# Patient Record
Sex: Female | Born: 1935 | Race: Black or African American | Hispanic: No | Smoking: Former smoker
Health system: Southern US, Community
[De-identification: ages and names within clinical notes are randomized; demographics above are authoritative.]

## PROBLEM LIST (undated history)

## (undated) DIAGNOSIS — E1101 Type 2 diabetes mellitus with hyperosmolarity with coma: Secondary | ICD-10-CM

## (undated) DIAGNOSIS — K219 Gastro-esophageal reflux disease without esophagitis: Secondary | ICD-10-CM

## (undated) DIAGNOSIS — E039 Hypothyroidism, unspecified: Secondary | ICD-10-CM

## (undated) DIAGNOSIS — I82409 Acute embolism and thrombosis of unspecified deep veins of unspecified lower extremity: Secondary | ICD-10-CM

## (undated) DIAGNOSIS — N181 Chronic kidney disease, stage 1: Secondary | ICD-10-CM

## (undated) DIAGNOSIS — E7800 Pure hypercholesterolemia, unspecified: Secondary | ICD-10-CM

## (undated) DIAGNOSIS — I959 Hypotension, unspecified: Secondary | ICD-10-CM

## (undated) DIAGNOSIS — I809 Phlebitis and thrombophlebitis of unspecified site: Secondary | ICD-10-CM

## (undated) DIAGNOSIS — D649 Anemia, unspecified: Secondary | ICD-10-CM

## (undated) DIAGNOSIS — F419 Anxiety disorder, unspecified: Secondary | ICD-10-CM

## (undated) DIAGNOSIS — K922 Gastrointestinal hemorrhage, unspecified: Secondary | ICD-10-CM

## (undated) HISTORY — PX: CHOLECYSTECTOMY: SHX55

## (undated) HISTORY — PX: ESOPHAGEAL DILATION: SHX303

## (undated) HISTORY — DX: Hypothyroidism, unspecified: E03.9

## (undated) HISTORY — PX: TONSILLECTOMY: SUR1361

## (undated) HISTORY — PX: ABDOMINAL HYSTERECTOMY: SHX81

## (undated) HISTORY — DX: Anxiety disorder, unspecified: F41.9

---

## 2000-07-22 ENCOUNTER — Emergency Department (HOSPITAL_COMMUNITY): Admission: EM | Admit: 2000-07-22 | Discharge: 2000-07-22 | Payer: Self-pay | Admitting: Emergency Medicine

## 2000-07-22 ENCOUNTER — Encounter: Payer: Self-pay | Admitting: Emergency Medicine

## 2000-11-06 ENCOUNTER — Emergency Department (HOSPITAL_COMMUNITY): Admission: EM | Admit: 2000-11-06 | Discharge: 2000-11-06 | Payer: Self-pay | Admitting: Internal Medicine

## 2001-01-28 ENCOUNTER — Emergency Department (HOSPITAL_COMMUNITY): Admission: EM | Admit: 2001-01-28 | Discharge: 2001-01-28 | Payer: Self-pay | Admitting: Internal Medicine

## 2001-11-06 ENCOUNTER — Ambulatory Visit (HOSPITAL_COMMUNITY): Admission: RE | Admit: 2001-11-06 | Discharge: 2001-11-06 | Payer: Self-pay | Admitting: Pulmonary Disease

## 2003-11-13 ENCOUNTER — Emergency Department (HOSPITAL_COMMUNITY): Admission: EM | Admit: 2003-11-13 | Discharge: 2003-11-14 | Payer: Self-pay | Admitting: *Deleted

## 2003-11-14 ENCOUNTER — Ambulatory Visit (HOSPITAL_COMMUNITY): Admission: RE | Admit: 2003-11-14 | Discharge: 2003-11-14 | Payer: Self-pay | Admitting: *Deleted

## 2003-11-22 ENCOUNTER — Observation Stay (HOSPITAL_COMMUNITY): Admission: RE | Admit: 2003-11-22 | Discharge: 2003-11-23 | Payer: Self-pay | Admitting: General Surgery

## 2003-12-23 ENCOUNTER — Ambulatory Visit (HOSPITAL_COMMUNITY): Admission: RE | Admit: 2003-12-23 | Discharge: 2003-12-23 | Payer: Self-pay | Admitting: Pulmonary Disease

## 2009-12-22 ENCOUNTER — Inpatient Hospital Stay (HOSPITAL_COMMUNITY)
Admission: RE | Admit: 2009-12-22 | Discharge: 2009-12-24 | Payer: Self-pay | Source: Home / Self Care | Admitting: Pulmonary Disease

## 2010-03-01 ENCOUNTER — Emergency Department (HOSPITAL_COMMUNITY)
Admission: EM | Admit: 2010-03-01 | Discharge: 2010-03-01 | Payer: Self-pay | Source: Home / Self Care | Admitting: Emergency Medicine

## 2010-03-09 LAB — URINE MICROSCOPIC-ADD ON

## 2010-03-09 LAB — CBC
HCT: 34.2 % — ABNORMAL LOW (ref 36.0–46.0)
Hemoglobin: 11.5 g/dL — ABNORMAL LOW (ref 12.0–15.0)
MCH: 29.9 pg (ref 26.0–34.0)
MCHC: 33.6 g/dL (ref 30.0–36.0)
MCV: 89.1 fL (ref 78.0–100.0)
Platelets: 230 10*3/uL (ref 150–400)
RBC: 3.84 MIL/uL — ABNORMAL LOW (ref 3.87–5.11)
RDW: 13.3 % (ref 11.5–15.5)
WBC: 5.8 10*3/uL (ref 4.0–10.5)

## 2010-03-09 LAB — URINALYSIS, ROUTINE W REFLEX MICROSCOPIC
Bilirubin Urine: NEGATIVE
Ketones, ur: NEGATIVE mg/dL
Leukocytes, UA: NEGATIVE
Nitrite: NEGATIVE
Protein, ur: NEGATIVE mg/dL
Specific Gravity, Urine: 1.01 (ref 1.005–1.030)
Urine Glucose, Fasting: NEGATIVE mg/dL
Urobilinogen, UA: 0.2 mg/dL (ref 0.0–1.0)
pH: 5.5 (ref 5.0–8.0)

## 2010-03-09 LAB — DIFFERENTIAL
Basophils Absolute: 0 10*3/uL (ref 0.0–0.1)
Basophils Relative: 0 % (ref 0–1)
Eosinophils Absolute: 0 10*3/uL (ref 0.0–0.7)
Eosinophils Relative: 1 % (ref 0–5)
Lymphocytes Relative: 23 % (ref 12–46)
Lymphs Abs: 1.3 10*3/uL (ref 0.7–4.0)
Monocytes Absolute: 0.6 10*3/uL (ref 0.1–1.0)
Monocytes Relative: 10 % (ref 3–12)
Neutro Abs: 3.8 10*3/uL (ref 1.7–7.7)
Neutrophils Relative %: 66 % (ref 43–77)

## 2010-03-09 LAB — TROPONIN I: Troponin I: 0.01 ng/mL (ref 0.00–0.06)

## 2010-03-09 LAB — BASIC METABOLIC PANEL
BUN: 19 mg/dL (ref 6–23)
CO2: 24 mEq/L (ref 19–32)
Calcium: 9.2 mg/dL (ref 8.4–10.5)
Chloride: 105 mEq/L (ref 96–112)
Creatinine, Ser: 1.15 mg/dL (ref 0.4–1.2)
GFR calc Af Amer: 54 mL/min — ABNORMAL LOW (ref >60)
GFR calc non Af Amer: 44 mL/min — ABNORMAL LOW (ref >60)
Glucose, Bld: 95 mg/dL (ref 70–99)
Potassium: 4.1 mEq/L (ref 3.5–5.1)
Sodium: 137 mEq/L (ref 135–145)

## 2010-05-05 LAB — BASIC METABOLIC PANEL
BUN: 24 mg/dL — ABNORMAL HIGH (ref 6–23)
CO2: 25 mEq/L (ref 19–32)
Calcium: 9.4 mg/dL (ref 8.4–10.5)
Chloride: 107 mEq/L (ref 96–112)
Creatinine, Ser: 1.02 mg/dL (ref 0.4–1.2)
GFR calc Af Amer: >60 mL/min (ref >60)
GFR calc non Af Amer: 51 mL/min — ABNORMAL LOW (ref >60)
Glucose, Bld: 79 mg/dL (ref 70–99)
Potassium: 4.4 mEq/L (ref 3.5–5.1)
Sodium: 141 mEq/L (ref 135–145)

## 2010-05-05 LAB — CBC
HCT: 31.5 % — ABNORMAL LOW (ref 36.0–46.0)
Hemoglobin: 10.5 g/dL — ABNORMAL LOW (ref 12.0–15.0)
MCH: 30.4 pg (ref 26.0–34.0)
MCHC: 33.3 g/dL (ref 30.0–36.0)
MCV: 91.3 fL (ref 78.0–100.0)
Platelets: 287 10*3/uL (ref 150–400)
RBC: 3.45 MIL/uL — ABNORMAL LOW (ref 3.87–5.11)
RDW: 13.9 % (ref 11.5–15.5)
WBC: 6.8 10*3/uL (ref 4.0–10.5)

## 2010-05-05 LAB — DIFFERENTIAL
Basophils Absolute: 0.1 10*3/uL (ref 0.0–0.1)
Basophils Relative: 1 % (ref 0–1)
Eosinophils Absolute: 0.1 10*3/uL (ref 0.0–0.7)
Eosinophils Relative: 2 % (ref 0–5)
Lymphocytes Relative: 34 % (ref 12–46)
Lymphs Abs: 2.3 10*3/uL (ref 0.7–4.0)
Monocytes Absolute: 0.9 10*3/uL (ref 0.1–1.0)
Monocytes Relative: 13 % — ABNORMAL HIGH (ref 3–12)
Neutro Abs: 3.4 10*3/uL (ref 1.7–7.7)
Neutrophils Relative %: 50 % (ref 43–77)

## 2010-05-05 LAB — PROTIME-INR
INR: 1.14 (ref 0.00–1.49)
INR: 1.25 (ref 0.00–1.49)
Prothrombin Time: 14.8 seconds (ref 11.6–15.2)
Prothrombin Time: 15.9 seconds — ABNORMAL HIGH (ref 11.6–15.2)

## 2010-05-06 LAB — CBC
HCT: 33.8 % — ABNORMAL LOW (ref 36.0–46.0)
Hemoglobin: 11.3 g/dL — ABNORMAL LOW (ref 12.0–15.0)
MCH: 30.6 pg (ref 26.0–34.0)
MCHC: 33.5 g/dL (ref 30.0–36.0)
MCV: 91.4 fL (ref 78.0–100.0)
Platelets: 291 10*3/uL (ref 150–400)
RBC: 3.69 MIL/uL — ABNORMAL LOW (ref 3.87–5.11)
RDW: 13.3 % (ref 11.5–15.5)
WBC: 7.1 10*3/uL (ref 4.0–10.5)

## 2010-05-06 LAB — BASIC METABOLIC PANEL
BUN: 23 mg/dL (ref 6–23)
CO2: 26 mEq/L (ref 19–32)
Calcium: 9.7 mg/dL (ref 8.4–10.5)
Chloride: 104 mEq/L (ref 96–112)
Creatinine, Ser: 1.15 mg/dL (ref 0.4–1.2)
GFR calc Af Amer: 54 mL/min — ABNORMAL LOW (ref >60)
GFR calc non Af Amer: 44 mL/min — ABNORMAL LOW (ref >60)
Glucose, Bld: 88 mg/dL (ref 70–99)
Potassium: 4.4 mEq/L (ref 3.5–5.1)
Sodium: 140 mEq/L (ref 135–145)

## 2010-05-06 LAB — PROTIME-INR
INR: 0.96 (ref 0.00–1.49)
Prothrombin Time: 13 seconds (ref 11.6–15.2)

## 2010-05-06 LAB — DIFFERENTIAL
Basophils Absolute: 0.1 10*3/uL (ref 0.0–0.1)
Basophils Relative: 1 % (ref 0–1)
Eosinophils Absolute: 0.1 10*3/uL (ref 0.0–0.7)
Eosinophils Relative: 1 % (ref 0–5)
Lymphocytes Relative: 28 % (ref 12–46)
Lymphs Abs: 2 10*3/uL (ref 0.7–4.0)
Monocytes Absolute: 0.6 10*3/uL (ref 0.1–1.0)
Monocytes Relative: 9 % (ref 3–12)
Neutro Abs: 4.3 10*3/uL (ref 1.7–7.7)
Neutrophils Relative %: 61 % (ref 43–77)

## 2010-06-18 ENCOUNTER — Other Ambulatory Visit (HOSPITAL_COMMUNITY): Payer: Self-pay | Admitting: Pulmonary Disease

## 2010-06-18 DIAGNOSIS — I82409 Acute embolism and thrombosis of unspecified deep veins of unspecified lower extremity: Secondary | ICD-10-CM

## 2010-06-19 ENCOUNTER — Ambulatory Visit (HOSPITAL_COMMUNITY)
Admission: RE | Admit: 2010-06-19 | Discharge: 2010-06-19 | Disposition: A | Discharge status: Home / Self Care | Payer: Medicare Other | Source: Ambulatory Visit | Attending: Pulmonary Disease | Admitting: Pulmonary Disease

## 2010-06-19 ENCOUNTER — Other Ambulatory Visit (HOSPITAL_COMMUNITY): Payer: Self-pay

## 2010-06-19 DIAGNOSIS — M7120 Synovial cyst of popliteal space [Baker], unspecified knee: Secondary | ICD-10-CM | POA: Insufficient documentation

## 2010-06-19 DIAGNOSIS — I82409 Acute embolism and thrombosis of unspecified deep veins of unspecified lower extremity: Secondary | ICD-10-CM

## 2010-06-19 DIAGNOSIS — I82891 Chronic embolism and thrombosis of other specified veins: Secondary | ICD-10-CM | POA: Insufficient documentation

## 2010-07-10 NOTE — H&P (Signed)
NAME:  Christine Hancock, Christine Hancock                ACCOUNT NO.:  [id number]   MEDICAL RECORD NO.:  [id number]          PATIENT TYPE:  AMB   LOCATION:  DAY                           FACILITY:  [facility]   PHYSICIAN:  Jamesetta So, M.D.  DATE OF BIRTH:  January 21, 1918   DATE OF ADMISSION:  11/22/2003  DATE OF DISCHARGE:  LH                                HISTORY & PHYSICAL   CHIEF COMPLAINT:  1.  Biliary colic.  2.  Cholecystitis.   HISTORY OF PRESENT ILLNESS:  The patient is an 77 year old black female who  is referred for evaluation and treatment of biliary colic.  She was seen  recently in the emergency room and found on ultrasound of the gallbladder to  have calcifications in the wall, but no stones.  She has been having  intermittent episodes of right upper quadrant abdominal pain with radiation  to the right flank, nausea and bloating for several months.  She does have  fatty food intolerance.  No fever, chills or jaundice have been noted.   PAST MEDICAL HISTORY:  1.  Hypothyroidism.  2.  Irritable bowel.   PAST SURGICAL HISTORY:  1.  Hysterectomy.  2.  Appendectomy.  3.  Tonsillectomy.   CURRENT MEDICATIONS:  Synthroid, Zelnorm, alprazolam, Bentyl, Flagyl,  famotidine, Zyprexa.   ALLERGIES:  FLU SHOTS.   REVIEW OF SYSTEMS:  The patient denies drinking or smoking.  She denies any  recent cardiopulmonary difficulties or bleeding disorders.   PHYSICAL EXAMINATION:  GENERAL APPEARANCE:  The patient is a pleasant black  female in no acute distress.  VITAL SIGNS:  She is afebrile and vital signs are stable.  HEENT:  No scleral icterus.  LUNGS:  Clear to auscultation with equal breath sounds bilaterally.  HEART:  Regular rate and rhythm without S3, S4 or murmurs.  ABDOMEN:  Soft with slight tenderness in the right upper quadrant to  palpation.  No hepatosplenomegaly, masses or hernias are identified.   IMPRESSION:  1.  Cholecystitis.  2.  Calcified gallbladder wall.   PLAN:  The patient  is scheduled for laparoscopic cholecystectomy on  November 22, 2003.  The risks and benefits of the procedure, including  bleeding, infection, hepatobiliary injury and the possibility of an open  procedure were fully explained to the patient, who gave informed consent.      MAJ/MEDQ  D:  11/21/2003  T:  11/21/2003  Job:  [id number]   cc:   Percell Miller L. Luan Pulling, M.D.  [place]  [place] [address]  Fax: [phone]

## 2010-07-10 NOTE — Op Note (Signed)
NAME:  Christine Hancock, Christine Hancock                ACCOUNT NO.:  [id number]   MEDICAL RECORD NO.:  [id number]          PATIENT TYPE:  OBV   LOCATION:  A301                          FACILITY:  [facility]   PHYSICIAN:  Jamesetta So, M.D.  DATE OF BIRTH:  November 07, 1917   DATE OF PROCEDURE:  11/22/2003  DATE OF DISCHARGE:                                 OPERATIVE REPORT   PREOPERATIVE DIAGNOSIS:  Cholecystitis, cholelithiasis.   POSTOPERATIVE DIAGNOSIS:  Cholecystitis, cholelithiasis.   PROCEDURE:  Laparoscopic cholecystectomy.   SURGEON:  Dr. Aviva Signs.   ASSISTANT:  Reita Cliche. Marnette Burgess, M.D.   ANESTHESIA:  General endotracheal.   INDICATIONS:  The patient is an 77 year old black female who presents with  biliary colic secondary to  cholecystitis and a calcified gallbladder wall.  The risks and benefits of the procedure including bleeding, infection,  hepatobiliary injury, and the possibility of an open procedure were fully  explained to the patient who gave informed consent.   PROCEDURE NOTE:  The patient was placed in the supine position. After  induction of general endotracheal anesthesia, the abdomen was prepped and  draped using the usual sterile technique with Betadine. Surgical site  confirmation was performed.   Supraumbilical incision was made down to the fascia. Verres needle was  introduced into the abdominal cavity, and confirmation of placement was done  using the saline drop test. The abdomen was then insufflated to 16 mmHg  pressure. An 11-mm trocar was introduced into the abdominal cavity under  direct visualization without difficulty. The patient was placed in reversed  Trendelenburg position. An additional 11-mm trocar was placed in the  epigastric region. A 5-mm trocar was placed in the right upper quadrant and  right flank regions. Liver was inspected and noted to be within normal  limits. The gallbladder was retracted superiorly and laterally. The  dissection was begun around  the infundibulum of the gallbladder. The cystic  duct was first identified. Its juncture to the infundibulum identified.  Endoclips were placed proximally and distally on the cystic duct, and the  cystic duct was divided. This was likewise done to the cystic artery. The  gallbladder was freed away from the gallbladder fossa using Bovie  electrocautery. The gallbladder was delivered through the epigastric trocar  site using EndoCatch bag. The gallbladder fossa was inspected, and no  abnormal bleeding or bile leakage was noted. Surgicel was placed in the  gallbladder fossa. Multiple adhesions of the liver to the underside of the  abdominal wall and chest wall were lysed sharply using Endoshears. All fluid  and air was then evacuated from the abdominal cavity prior to removal of the  trocars.   All wound were irrigated with normal saline. All wounds were injected with  0.5% Sensorcaine. The supraumbilical fascia was reapproximated using an 0  Vicryl interrupted suture. All skin incisions were closed using staples.  Betadine ointment and dry sterile dressings were applied.   All tape and needle counts correct at the end of the procedure. The patient  was extubated in the operating room and went back to recovery room awake in  stable condition.   COMPLICATIONS:  None.   SPECIMENS:  Gallbladder.   BLOOD LOSS:  Minimal.      MAJ/MEDQ  D:  11/22/2003  T:  11/22/2003  Job:  [id number]   cc:   Percell Miller L. Luan Pulling, M.D.  [place]  [place] [address]  Fax: [phone]

## 2011-02-23 HISTORY — PX: ESOPHAGOGASTRODUODENOSCOPY: SHX1529

## 2011-03-04 DIAGNOSIS — F411 Generalized anxiety disorder: Secondary | ICD-10-CM | POA: Diagnosis not present

## 2011-03-04 DIAGNOSIS — Z7901 Long term (current) use of anticoagulants: Secondary | ICD-10-CM | POA: Diagnosis not present

## 2011-03-04 DIAGNOSIS — E039 Hypothyroidism, unspecified: Secondary | ICD-10-CM | POA: Diagnosis not present

## 2011-03-04 DIAGNOSIS — E785 Hyperlipidemia, unspecified: Secondary | ICD-10-CM | POA: Diagnosis not present

## 2011-03-18 DIAGNOSIS — Z7901 Long term (current) use of anticoagulants: Secondary | ICD-10-CM | POA: Diagnosis not present

## 2011-04-01 DIAGNOSIS — Z7901 Long term (current) use of anticoagulants: Secondary | ICD-10-CM | POA: Diagnosis not present

## 2011-04-01 DIAGNOSIS — Z79899 Other long term (current) drug therapy: Secondary | ICD-10-CM | POA: Diagnosis not present

## 2011-04-05 DIAGNOSIS — Z7901 Long term (current) use of anticoagulants: Secondary | ICD-10-CM | POA: Diagnosis not present

## 2011-04-19 DIAGNOSIS — Z7901 Long term (current) use of anticoagulants: Secondary | ICD-10-CM | POA: Diagnosis not present

## 2011-04-19 DIAGNOSIS — Z79899 Other long term (current) drug therapy: Secondary | ICD-10-CM | POA: Diagnosis not present

## 2011-05-03 DIAGNOSIS — N390 Urinary tract infection, site not specified: Secondary | ICD-10-CM | POA: Diagnosis not present

## 2011-05-03 DIAGNOSIS — R358 Other polyuria: Secondary | ICD-10-CM | POA: Diagnosis not present

## 2011-05-03 DIAGNOSIS — Z7901 Long term (current) use of anticoagulants: Secondary | ICD-10-CM | POA: Diagnosis not present

## 2011-05-17 DIAGNOSIS — Z7901 Long term (current) use of anticoagulants: Secondary | ICD-10-CM | POA: Diagnosis not present

## 2011-06-02 DIAGNOSIS — Z7901 Long term (current) use of anticoagulants: Secondary | ICD-10-CM | POA: Diagnosis not present

## 2011-06-02 DIAGNOSIS — E039 Hypothyroidism, unspecified: Secondary | ICD-10-CM | POA: Diagnosis not present

## 2011-06-02 DIAGNOSIS — E785 Hyperlipidemia, unspecified: Secondary | ICD-10-CM | POA: Diagnosis not present

## 2011-06-16 DIAGNOSIS — Z7901 Long term (current) use of anticoagulants: Secondary | ICD-10-CM | POA: Diagnosis not present

## 2011-07-14 DIAGNOSIS — I10 Essential (primary) hypertension: Secondary | ICD-10-CM | POA: Diagnosis not present

## 2011-07-14 DIAGNOSIS — E039 Hypothyroidism, unspecified: Secondary | ICD-10-CM | POA: Diagnosis not present

## 2011-07-14 DIAGNOSIS — E785 Hyperlipidemia, unspecified: Secondary | ICD-10-CM | POA: Diagnosis not present

## 2011-08-10 DIAGNOSIS — H26019 Infantile and juvenile cortical, lamellar, or zonular cataract, unspecified eye: Secondary | ICD-10-CM | POA: Diagnosis not present

## 2011-08-10 DIAGNOSIS — H2510 Age-related nuclear cataract, unspecified eye: Secondary | ICD-10-CM | POA: Diagnosis not present

## 2011-09-07 DIAGNOSIS — E039 Hypothyroidism, unspecified: Secondary | ICD-10-CM | POA: Diagnosis not present

## 2011-09-07 DIAGNOSIS — I82220 Acute embolism and thrombosis of inferior vena cava: Secondary | ICD-10-CM | POA: Diagnosis not present

## 2011-09-07 DIAGNOSIS — F411 Generalized anxiety disorder: Secondary | ICD-10-CM | POA: Diagnosis not present

## 2011-09-07 DIAGNOSIS — E785 Hyperlipidemia, unspecified: Secondary | ICD-10-CM | POA: Diagnosis not present

## 2011-09-30 ENCOUNTER — Other Ambulatory Visit: Payer: Self-pay

## 2011-09-30 ENCOUNTER — Encounter (HOSPITAL_COMMUNITY): Payer: Self-pay | Admitting: *Deleted

## 2011-09-30 ENCOUNTER — Emergency Department (HOSPITAL_COMMUNITY): Payer: Medicare Other

## 2011-09-30 ENCOUNTER — Emergency Department (HOSPITAL_COMMUNITY)
Admission: EM | Admit: 2011-09-30 | Discharge: 2011-09-30 | Disposition: A | Discharge status: Home / Self Care | Payer: Medicare Other | Attending: Emergency Medicine | Admitting: Emergency Medicine

## 2011-09-30 DIAGNOSIS — J4489 Other specified chronic obstructive pulmonary disease: Secondary | ICD-10-CM | POA: Insufficient documentation

## 2011-09-30 DIAGNOSIS — J438 Other emphysema: Secondary | ICD-10-CM | POA: Diagnosis not present

## 2011-09-30 DIAGNOSIS — I959 Hypotension, unspecified: Secondary | ICD-10-CM | POA: Diagnosis not present

## 2011-09-30 DIAGNOSIS — R5383 Other fatigue: Secondary | ICD-10-CM | POA: Diagnosis not present

## 2011-09-30 DIAGNOSIS — I4460 Unspecified fascicular block: Secondary | ICD-10-CM | POA: Diagnosis not present

## 2011-09-30 DIAGNOSIS — J449 Chronic obstructive pulmonary disease, unspecified: Secondary | ICD-10-CM | POA: Insufficient documentation

## 2011-09-30 DIAGNOSIS — R531 Weakness: Secondary | ICD-10-CM

## 2011-09-30 DIAGNOSIS — I82509 Chronic embolism and thrombosis of unspecified deep veins of unspecified lower extremity: Secondary | ICD-10-CM | POA: Diagnosis not present

## 2011-09-30 DIAGNOSIS — R5381 Other malaise: Secondary | ICD-10-CM | POA: Diagnosis not present

## 2011-09-30 HISTORY — DX: Hypotension, unspecified: I95.9

## 2011-09-30 LAB — CBC WITH DIFFERENTIAL/PLATELET
Basophils Absolute: 0 10*3/uL (ref 0.0–0.1)
Basophils Relative: 0 % (ref 0–1)
Eosinophils Absolute: 0.4 10*3/uL (ref 0.0–0.7)
HCT: 31 % — ABNORMAL LOW (ref 36.0–46.0)
MCHC: 32.6 g/dL (ref 30.0–36.0)
Monocytes Absolute: 0.5 10*3/uL (ref 0.1–1.0)
Neutro Abs: 3.6 10*3/uL (ref 1.7–7.7)
RDW: 13 % (ref 11.5–15.5)

## 2011-09-30 LAB — TROPONIN I: Troponin I: <0.3 ng/mL (ref <0.30)

## 2011-09-30 LAB — URINALYSIS, ROUTINE W REFLEX MICROSCOPIC
Bilirubin Urine: NEGATIVE
Ketones, ur: NEGATIVE mg/dL
Nitrite: NEGATIVE
Protein, ur: NEGATIVE mg/dL
Specific Gravity, Urine: 1.01 (ref 1.005–1.030)
Urobilinogen, UA: 0.2 mg/dL (ref 0.0–1.0)

## 2011-09-30 LAB — BASIC METABOLIC PANEL
Calcium: 10.1 mg/dL (ref 8.4–10.5)
Chloride: 100 mEq/L (ref 96–112)
Creatinine, Ser: 1.47 mg/dL — ABNORMAL HIGH (ref 0.50–1.10)
GFR calc Af Amer: 34 mL/min — ABNORMAL LOW (ref >90)
GFR calc non Af Amer: 30 mL/min — ABNORMAL LOW (ref >90)

## 2011-09-30 MED ORDER — SODIUM CHLORIDE 0.9 % IV BOLUS (SEPSIS)
1000.0000 mL | Freq: Once | INTRAVENOUS | Status: AC
  Administered 2011-09-30: 1000 mL via INTRAVENOUS

## 2011-09-30 NOTE — ED Provider Notes (Signed)
History     CSN: [id number]  Arrival date & time 09/30/11  1203   First MD Initiated Contact with Patient 09/30/11 1307      Chief Complaint  Patient presents with  . Leg Pain  . Hypotension    (Consider location/radiation/quality/duration/timing/severity/associated sxs/prior treatment) HPI Pt brought to the ED by brother apparently for leg pain which is chronic for this patient and attributed to "phlebitis". Pt states she is also feeling generally weak, but unable to describe any specific symptoms that are bothering her aside from leg pain which is bilateral, moderate and aching. Denies any fever, CP, SOB, N/V/D or dysuria. She was found to be hypotensive on arrival.   Past Medical History  Diagnosis Date  . Hypotension     Past Surgical History  Procedure Date  . Esophageal dilation     No family history on file.  History  Substance Use Topics  . Smoking status: Not on file  . Smokeless tobacco: Not on file  . Alcohol Use:     OB History    Grav Para Term Preterm Abortions TAB SAB Ect Mult Living                  Review of Systems All other systems reviewed and are negative except as noted in HPI.   Allergies  Review of patient's allergies indicates no known allergies.  Home Medications   Current Outpatient Rx  Name Route Sig Dispense Refill  . ALPRAZOLAM 0.5 MG PO TABS Oral Take 0.5 mg by mouth 2 (two) times daily.    . ASPIRIN EC 325 MG PO TBEC Oral Take 325 mg by mouth daily.    Marland Kitchen FLUOCINOLONE ACETONIDE 0.01 % EX OIL Topical Apply 1 application topically daily as needed. For scalp    . FUROSEMIDE 40 MG PO TABS Oral Take 40 mg by mouth daily.    Marland Kitchen GABAPENTIN 300 MG PO CAPS Oral Take 300 mg by mouth at bedtime.    Marland Kitchen LEVOTHYROXINE SODIUM 88 MCG PO TABS Oral Take 88 mcg by mouth daily.    Marland Kitchen MAGNESIUM HYDROXIDE 400 MG/5ML PO SUSP Oral Take 15 mLs by mouth daily as needed. For constipation    . ADULT MULTIVITAMIN W/MINERALS CH Oral Take 1 tablet by mouth  daily.    Marland Kitchen OMEPRAZOLE 20 MG PO CPDR Oral Take 20 mg by mouth daily.    Marland Kitchen PRAVASTATIN SODIUM 40 MG PO TABS Oral Take 40 mg by mouth daily.    Marland Kitchen RISPERIDONE 0.5 MG PO TABS Oral Take 0.5 mg by mouth 2 (two) times daily.    Marland Kitchen ROPINIROLE HCL 0.5 MG PO TABS Oral Take 0.5 mg by mouth 2 (two) times daily.      BP 95/57  Pulse 73  Temp 98 F (36.7 C) (Oral)  Resp 17  Ht 5\' 2"  (1.575 m)  Wt 136 lb (61.689 kg)  BMI 24.87 kg/m2  SpO2 99%  Physical Exam  Nursing note and vitals reviewed. Constitutional: She is oriented to person, place, and time. She appears well-developed and well-nourished.  HENT:  Head: Normocephalic and atraumatic.       Mouth dry  Eyes: EOM are normal. Pupils are equal, round, and reactive to light.  Neck: Normal range of motion. Neck supple.  Cardiovascular: Normal rate, normal heart sounds and intact distal pulses.   Pulmonary/Chest: Effort normal and breath sounds normal.  Abdominal: Bowel sounds are normal. She exhibits no distension. There is no tenderness.  Musculoskeletal: Normal  range of motion. She exhibits no edema and no tenderness.  Neurological: She is alert and oriented to person, place, and time. She has normal strength. No cranial nerve deficit or sensory deficit.  Skin: Skin is warm and dry. No rash noted.  Psychiatric: She has a normal mood and affect.    ED Course  Procedures (including critical care time)  Labs Reviewed  CBC WITH DIFFERENTIAL - Abnormal; Notable for the following:    RBC 3.40 (*)     Hemoglobin 10.1 (*)     HCT 31.0 (*)     Eosinophils Relative 7 (*)     All other components within normal limits  BASIC METABOLIC PANEL - Abnormal; Notable for the following:    Glucose, Bld 140 (*)     BUN 37 (*)     Creatinine, Ser 1.47 (*)     GFR calc non Af Amer 30 (*)     GFR calc Af Amer 34 (*)     All other components within normal limits  TROPONIN I  URINALYSIS, ROUTINE W REFLEX MICROSCOPIC   Dg Chest Portable 1  View  09/30/2011  *RADIOLOGY REPORT*  Clinical Data: Hypotension, bilateral leg pain  PORTABLE CHEST - 1 VIEW  Comparison: Report of remote prior non digitized exam 07/22/2000  Findings: Marked hyperinflation of the lungs is compatible with previously seen diagnosis of COPD.  The patient is rotated to the left.  Heart size upper limits of normal.  No pleural effusion.  No focal pulmonary opacity.  Minimal biapical pleural thickening.  IMPRESSION: Emphysematous changes without acute focal abnormality.  Original Report Authenticated By: Arline Asp, M.D.     No diagnosis found.    MDM  BP improved with IVF. No clear abnormalities on exam. Awaiting labs and imaging.    Date: 09/30/2011  Rate: 75  Rhythm: normal sinus rhythm  QRS Axis: left  Intervals: normal  ST/T Wave abnormalities: normal  Conduction Disutrbances:left anterior fascicular block  Narrative Interpretation:   Old EKG Reviewed: unchanged   Discussed patient with Dr. Luan Pulling who knows her well. Pt has chronic DVT (which may be what she is describing as phlebitis) but is not a candidate for coumadin or filter. There is no significant swelling today to indicate acute worsening. She has not signs or symptoms of PE. Per Dr. Luan Pulling, family has been attempting to persuade the patient to move to ALF or SNF but have not been successful. Dr. Luan Pulling is working on paperwork to help place patient as an outpatient when she is agreeable. Awaiting UA, but blood work is relatively unremarkable. Slight increase in BUN/Cr from previous may indicate mild dehydration. Per Dr. Luan Pulling her baseline SBP is about 100.    2:53 PM Pt able to ambulate without difficulty. No indication for admission at this time. Discussed with Brother at bedside, he was primarily concerned about gradual onset of decreased energy and activity around the house. Advised close followup with Dr. Luan Pulling for placement in SNF if that is their plan. Return to the ED for any  other concerns.   Navia Lindahl B. Karle Starch, MD 09/30/11 1455

## 2011-09-30 NOTE — ED Notes (Signed)
Pt c/o of bilateral leg pain, states due to phlebitis, which pt has a hx of

## 2011-09-30 NOTE — ED Notes (Signed)
Patient was able to walk with assistance.  Patient denies any dizziness or sob.

## 2011-10-03 ENCOUNTER — Encounter (HOSPITAL_COMMUNITY): Payer: Self-pay

## 2011-10-14 DIAGNOSIS — F411 Generalized anxiety disorder: Secondary | ICD-10-CM | POA: Diagnosis not present

## 2011-10-14 DIAGNOSIS — R5383 Other fatigue: Secondary | ICD-10-CM | POA: Diagnosis not present

## 2011-10-14 DIAGNOSIS — D649 Anemia, unspecified: Secondary | ICD-10-CM | POA: Diagnosis not present

## 2011-10-19 DIAGNOSIS — D235 Other benign neoplasm of skin of trunk: Secondary | ICD-10-CM | POA: Diagnosis not present

## 2011-10-19 DIAGNOSIS — L259 Unspecified contact dermatitis, unspecified cause: Secondary | ICD-10-CM | POA: Diagnosis not present

## 2011-10-23 ENCOUNTER — Encounter (HOSPITAL_COMMUNITY): Payer: Self-pay | Admitting: *Deleted

## 2011-10-23 ENCOUNTER — Emergency Department (HOSPITAL_COMMUNITY): Payer: Medicare Other

## 2011-10-23 ENCOUNTER — Inpatient Hospital Stay (HOSPITAL_COMMUNITY)
Admission: EM | Admit: 2011-10-23 | Discharge: 2011-10-30 | DRG: 812 | Disposition: A | Discharge status: Skilled Nursing Facility | Payer: Medicare Other | Attending: Pulmonary Disease | Admitting: Pulmonary Disease

## 2011-10-23 DIAGNOSIS — E869 Volume depletion, unspecified: Secondary | ICD-10-CM | POA: Diagnosis not present

## 2011-10-23 DIAGNOSIS — E785 Hyperlipidemia, unspecified: Secondary | ICD-10-CM | POA: Diagnosis present

## 2011-10-23 DIAGNOSIS — K219 Gastro-esophageal reflux disease without esophagitis: Secondary | ICD-10-CM | POA: Diagnosis present

## 2011-10-23 DIAGNOSIS — Z7982 Long term (current) use of aspirin: Secondary | ICD-10-CM

## 2011-10-23 DIAGNOSIS — D509 Iron deficiency anemia, unspecified: Secondary | ICD-10-CM | POA: Diagnosis not present

## 2011-10-23 DIAGNOSIS — I959 Hypotension, unspecified: Secondary | ICD-10-CM | POA: Diagnosis present

## 2011-10-23 DIAGNOSIS — K5730 Diverticulosis of large intestine without perforation or abscess without bleeding: Secondary | ICD-10-CM | POA: Diagnosis present

## 2011-10-23 DIAGNOSIS — E782 Mixed hyperlipidemia: Secondary | ICD-10-CM | POA: Diagnosis not present

## 2011-10-23 DIAGNOSIS — F411 Generalized anxiety disorder: Secondary | ICD-10-CM | POA: Diagnosis present

## 2011-10-23 DIAGNOSIS — F419 Anxiety disorder, unspecified: Secondary | ICD-10-CM | POA: Diagnosis present

## 2011-10-23 DIAGNOSIS — R933 Abnormal findings on diagnostic imaging of other parts of digestive tract: Secondary | ICD-10-CM | POA: Diagnosis not present

## 2011-10-23 DIAGNOSIS — M2550 Pain in unspecified joint: Secondary | ICD-10-CM | POA: Diagnosis not present

## 2011-10-23 DIAGNOSIS — R627 Adult failure to thrive: Secondary | ICD-10-CM | POA: Diagnosis not present

## 2011-10-23 DIAGNOSIS — J438 Other emphysema: Secondary | ICD-10-CM | POA: Diagnosis not present

## 2011-10-23 DIAGNOSIS — E039 Hypothyroidism, unspecified: Secondary | ICD-10-CM | POA: Diagnosis present

## 2011-10-23 DIAGNOSIS — K449 Diaphragmatic hernia without obstruction or gangrene: Secondary | ICD-10-CM | POA: Diagnosis not present

## 2011-10-23 DIAGNOSIS — R1013 Epigastric pain: Secondary | ICD-10-CM | POA: Diagnosis not present

## 2011-10-23 DIAGNOSIS — N189 Chronic kidney disease, unspecified: Secondary | ICD-10-CM | POA: Diagnosis present

## 2011-10-23 DIAGNOSIS — N179 Acute kidney failure, unspecified: Secondary | ICD-10-CM | POA: Diagnosis present

## 2011-10-23 DIAGNOSIS — Z79899 Other long term (current) drug therapy: Secondary | ICD-10-CM

## 2011-10-23 DIAGNOSIS — M6281 Muscle weakness (generalized): Secondary | ICD-10-CM | POA: Diagnosis not present

## 2011-10-23 DIAGNOSIS — N19 Unspecified kidney failure: Secondary | ICD-10-CM | POA: Diagnosis not present

## 2011-10-23 DIAGNOSIS — R5381 Other malaise: Secondary | ICD-10-CM | POA: Diagnosis not present

## 2011-10-23 DIAGNOSIS — K2940 Chronic atrophic gastritis without bleeding: Secondary | ICD-10-CM | POA: Diagnosis not present

## 2011-10-23 DIAGNOSIS — R109 Unspecified abdominal pain: Secondary | ICD-10-CM | POA: Diagnosis not present

## 2011-10-23 DIAGNOSIS — E860 Dehydration: Secondary | ICD-10-CM | POA: Diagnosis present

## 2011-10-23 DIAGNOSIS — R5383 Other fatigue: Secondary | ICD-10-CM | POA: Diagnosis not present

## 2011-10-23 DIAGNOSIS — K222 Esophageal obstruction: Secondary | ICD-10-CM | POA: Diagnosis present

## 2011-10-23 DIAGNOSIS — D631 Anemia in chronic kidney disease: Secondary | ICD-10-CM | POA: Diagnosis not present

## 2011-10-23 DIAGNOSIS — F29 Unspecified psychosis not due to a substance or known physiological condition: Secondary | ICD-10-CM | POA: Diagnosis not present

## 2011-10-23 HISTORY — DX: Gastro-esophageal reflux disease without esophagitis: K21.9

## 2011-10-23 HISTORY — DX: Phlebitis and thrombophlebitis of unspecified site: I80.9

## 2011-10-23 HISTORY — DX: Pure hypercholesterolemia, unspecified: E78.00

## 2011-10-23 LAB — COMPREHENSIVE METABOLIC PANEL
Albumin: 3.7 g/dL (ref 3.5–5.2)
Alkaline Phosphatase: 77 U/L (ref 39–117)
BUN: 40 mg/dL — ABNORMAL HIGH (ref 6–23)
Chloride: 101 mEq/L (ref 96–112)
Creatinine, Ser: 2.49 mg/dL — ABNORMAL HIGH (ref 0.50–1.10)
GFR calc Af Amer: 18 mL/min — ABNORMAL LOW (ref >90)
Glucose, Bld: 78 mg/dL (ref 70–99)
Potassium: 4.3 mEq/L (ref 3.5–5.1)
Total Bilirubin: 0.2 mg/dL — ABNORMAL LOW (ref 0.3–1.2)

## 2011-10-23 LAB — URINALYSIS, ROUTINE W REFLEX MICROSCOPIC
Bilirubin Urine: NEGATIVE
Hgb urine dipstick: NEGATIVE
Ketones, ur: NEGATIVE mg/dL
Nitrite: NEGATIVE
Urobilinogen, UA: 0.2 mg/dL (ref 0.0–1.0)

## 2011-10-23 LAB — CBC WITH DIFFERENTIAL/PLATELET
Basophils Absolute: 0 10*3/uL (ref 0.0–0.1)
Basophils Relative: 0 % (ref 0–1)
Eosinophils Relative: 1 % (ref 0–5)
HCT: 26.4 % — ABNORMAL LOW (ref 36.0–46.0)
Hemoglobin: 8.9 g/dL — ABNORMAL LOW (ref 12.0–15.0)
MCH: 30.7 pg (ref 26.0–34.0)
MCHC: 33.7 g/dL (ref 30.0–36.0)
MCV: 91 fL (ref 78.0–100.0)
Monocytes Absolute: 0.7 10*3/uL (ref 0.1–1.0)
Monocytes Relative: 14 % — ABNORMAL HIGH (ref 3–12)
RDW: 13.5 % (ref 11.5–15.5)

## 2011-10-23 LAB — RETICULOCYTES
RBC.: 2.93 MIL/uL — ABNORMAL LOW (ref 3.87–5.11)
Retic Count, Absolute: 29.3 10*3/uL (ref 19.0–186.0)

## 2011-10-23 LAB — TROPONIN I: Troponin I: <0.3 ng/mL (ref <0.30)

## 2011-10-23 MED ORDER — ROPINIROLE HCL 0.25 MG PO TABS
0.5000 mg | ORAL_TABLET | Freq: Every day | ORAL | Status: DC
  Administered 2011-10-23 – 2011-10-29 (×7): 0.5 mg via ORAL
  Filled 2011-10-23 (×3): qty 2
  Filled 2011-10-23 (×2): qty 1
  Filled 2011-10-23 (×4): qty 2

## 2011-10-23 MED ORDER — ENOXAPARIN SODIUM 30 MG/0.3ML ~~LOC~~ SOLN
30.0000 mg | SUBCUTANEOUS | Status: DC
  Administered 2011-10-23 – 2011-10-26 (×3): 30 mg via SUBCUTANEOUS
  Filled 2011-10-23 (×4): qty 0.3

## 2011-10-23 MED ORDER — GABAPENTIN 300 MG PO CAPS
300.0000 mg | ORAL_CAPSULE | Freq: Every day | ORAL | Status: DC
  Administered 2011-10-23 – 2011-10-29 (×7): 300 mg via ORAL
  Filled 2011-10-23 (×7): qty 1

## 2011-10-23 MED ORDER — HYDROCODONE-ACETAMINOPHEN 5-325 MG PO TABS: 1.0000 | ORAL_TABLET | ORAL | Status: DC | PRN

## 2011-10-23 MED ORDER — ALPRAZOLAM 0.5 MG PO TABS
0.5000 mg | ORAL_TABLET | Freq: Four times a day (QID) | ORAL | Status: DC | PRN
  Administered 2011-10-23 – 2011-10-29 (×4): 0.5 mg via ORAL
  Filled 2011-10-23 (×5): qty 1

## 2011-10-23 MED ORDER — SODIUM CHLORIDE 0.9 % IV SOLN
INTRAVENOUS | Status: DC
  Administered 2011-10-23 – 2011-10-29 (×10): via INTRAVENOUS

## 2011-10-23 MED ORDER — SODIUM CHLORIDE 0.9 % IV BOLUS (SEPSIS)
1000.0000 mL | Freq: Once | INTRAVENOUS | Status: AC
  Administered 2011-10-23: 1000 mL via INTRAVENOUS

## 2011-10-23 MED ORDER — ONDANSETRON HCL 4 MG PO TABS: 4.0000 mg | ORAL_TABLET | Freq: Four times a day (QID) | ORAL | Status: DC | PRN

## 2011-10-23 MED ORDER — ROPINIROLE HCL 1 MG PO TABS
ORAL_TABLET | ORAL | Status: AC
  Filled 2011-10-23: qty 1

## 2011-10-23 MED ORDER — RISPERIDONE 0.5 MG PO TABS
0.5000 mg | ORAL_TABLET | Freq: Two times a day (BID) | ORAL | Status: DC
  Administered 2011-10-23 – 2011-10-29 (×12): 0.5 mg via ORAL
  Filled 2011-10-23 (×12): qty 1

## 2011-10-23 MED ORDER — ACETAMINOPHEN 325 MG PO TABS
650.0000 mg | ORAL_TABLET | Freq: Four times a day (QID) | ORAL | Status: DC | PRN
  Administered 2011-10-26 – 2011-10-27 (×2): 650 mg via ORAL
  Filled 2011-10-23 (×2): qty 2

## 2011-10-23 MED ORDER — TRAZODONE HCL 50 MG PO TABS: 25.0000 mg | ORAL_TABLET | Freq: Every evening | ORAL | Status: DC | PRN

## 2011-10-23 MED ORDER — ONDANSETRON HCL 4 MG/2ML IJ SOLN: 4.0000 mg | Freq: Four times a day (QID) | INTRAMUSCULAR | Status: DC | PRN

## 2011-10-23 MED ORDER — LEVOTHYROXINE SODIUM 88 MCG PO TABS
88.0000 ug | ORAL_TABLET | Freq: Every day | ORAL | Status: DC
  Administered 2011-10-24 – 2011-10-29 (×5): 88 ug via ORAL
  Filled 2011-10-23 (×10): qty 1

## 2011-10-23 MED ORDER — ALUM & MAG HYDROXIDE-SIMETH 200-200-20 MG/5ML PO SUSP: 30.0000 mL | Freq: Four times a day (QID) | ORAL | Status: DC | PRN

## 2011-10-23 MED ORDER — PANTOPRAZOLE SODIUM 40 MG PO TBEC
40.0000 mg | DELAYED_RELEASE_TABLET | Freq: Every day | ORAL | Status: DC
  Administered 2011-10-24 – 2011-10-29 (×5): 40 mg via ORAL
  Filled 2011-10-23 (×5): qty 1

## 2011-10-23 MED ORDER — SIMVASTATIN 20 MG PO TABS
40.0000 mg | ORAL_TABLET | Freq: Every day | ORAL | Status: DC
Start: 2011-10-24 — End: 2011-10-30
  Administered 2011-10-24 – 2011-10-29 (×5): 40 mg via ORAL
  Filled 2011-10-23 (×5): qty 2

## 2011-10-23 MED ORDER — DOCUSATE SODIUM 100 MG PO CAPS
100.0000 mg | ORAL_CAPSULE | Freq: Two times a day (BID) | ORAL | Status: DC
  Administered 2011-10-23 – 2011-10-29 (×11): 100 mg via ORAL
  Filled 2011-10-23 (×11): qty 1

## 2011-10-23 MED ORDER — ACETAMINOPHEN 650 MG RE SUPP: 650.0000 mg | Freq: Four times a day (QID) | RECTAL | Status: DC | PRN

## 2011-10-23 NOTE — H&P (Signed)
Christine Hancock MRN: [id number] DOB/AGE: August 31, 1917 78 y.o. Primary Care Physician:Suraya Vidrine L, MD Admit date: 10/23/2011 Chief Complaint: Weakness HPI: This is a 78 year old who has had increasing weakness over the last several weeks. She has been in the emergency room several days ago. At that time she was felt to be mildly dehydrated and was given some fluids and improved. She has generally declined over the last 6 months to one year. She has not had any nausea vomiting abdominal pain bleeding shortness of breath or chest  Past Medical History  Diagnosis Date  . Hypotension   . Phlebitis   . Hypotension    Past Surgical History  Procedure Date  . Esophageal dilation    She has a history of severe anxiety and a thyroid problems with hypothyroidism     No family history on file.  Social History:  does not have a smoking history on file. She does not have any smokeless tobacco history on file. She reports that she does not drink alcohol. Her drug history not on file.   Allergies: No Known Allergies   (Not in a hospital admission)     [id number] from the symptoms mentioned above,there are no other symptoms referable to all systems reviewed.  Physical Exam: Blood pressure 91/59, pulse 90, temperature 98 F (36.7 C), temperature source Oral, resp. rate 16, height 5\' 7"  (1.702 m), weight 65.772 kg (145 lb), SpO2 95.00%. She is awake and alert. Her pupils are reactive. Her nose and throat are clear. Her mucous membranes are dry. Her neck is supple. Her chest is clear. Her heart is regular without murmur gallop or rub. Her abdomen is soft no masses are felt. Bowel sounds present and active. Extremities showed no edema. Central nervous system examination is grossly intact    Basename 10/23/11 1445  WBC 5.1  NEUTROABS 3.2  HGB 8.9*  HCT 26.4*  MCV 91.0  PLT 240    Basename 10/23/11 1445  NA 137  K 4.3  CL 101  CO2 25  GLUCOSE 78  BUN 40*  CREATININE 2.49*  CALCIUM  9.6  MG --  lablast2(ast:2,ALT:2,alkphos:2,bilitot:2,prot:2,albumin:2)@    No results found for this or any previous visit (from the past 240 hour(s)).   Ct Head Wo Contrast  10/23/2011  *RADIOLOGY REPORT*  Clinical Data: Fatigue and weakness.  CT HEAD WITHOUT CONTRAST  Technique:  Contiguous axial images were obtained from the base of the skull through the vertex without contrast.  Comparison: None.  Findings: Mild generalized atrophy is likely within normal limits for age.  No acute cortical infarct, hemorrhage, or mass lesion is present.  The ventricles are proportionate to the degree of atrophy.  The paranasal sinuses and mastoid air cells are clear.  The osseous skull is intact.  The mild atherosclerotic calcifications are present in the cavernous carotid arteries bilaterally.  IMPRESSION: Normal CT of the head for age.   Original Report Authenticated By: Resa Miner. MATTERN, M.D.    Dg Chest Port 1 View  10/23/2011  *RADIOLOGY REPORT*  Clinical Data: Weakness  PORTABLE CHEST - 1 VIEW  Comparison: Portable exam 1545 hours compared to 09/30/2011  Findings: Rotated to the left. Normal heart size, mediastinal contours and pulmonary vascularity. Atherosclerotic calcifications aorta. Accentuation of right basilar markings appears unchanged, suspect related to rotation, unchanged when accounting for differences in technique. Underlying emphysematous changes. No acute infiltrate, pleural effusion or pneumothorax. Bones appear diffusely demineralized.  IMPRESSION: Emphysematous changes. No acute abnormalities.   Original Report Authenticated  By: Burnetta Sabin, M.D.    Dg Chest Portable 1 View  09/30/2011  *RADIOLOGY REPORT*  Clinical Data: Hypotension, bilateral leg pain  PORTABLE CHEST - 1 VIEW  Comparison: Report of remote prior non digitized exam 07/22/2000  Findings: Marked hyperinflation of the lungs is compatible with previously seen diagnosis of COPD.  The patient is rotated to the left.  Heart  size upper limits of normal.  No pleural effusion.  No focal pulmonary opacity.  Minimal biapical pleural thickening.  IMPRESSION: Emphysematous changes without acute focal abnormality.  Original Report Authenticated By: Arline Asp, M.D.   Impression: She seems to have somewhat worse anemia. She will be evaluated for GI bleeding. I think she's dehydrated. Her renal function is worse. Active Problems:  * No active hospital problems. *      Plan: She will be started on IV fluids have cultures done to be sure not missing something like a urinary tract infection she'll need to have stool for blood and follow her electrolytes closely      Savannha Welle L Pager [phone]  10/23/2011, 5:41 PM

## 2011-10-23 NOTE — ED Notes (Signed)
Christine Hancock (brother who lives with her) [phone] Cell:[phone] Brother is leaving at this time and may be reached at the above numbers if needed

## 2011-10-23 NOTE — ED Notes (Signed)
Weakness began two days ago.

## 2011-10-23 NOTE — ED Provider Notes (Addendum)
History   This chart was scribed for Maudry Diego, MD scribed by Mitchell Heir. The patient was seen in room APA01/APA01 at 15:06   CSN: [id number]  Arrival date & time 10/23/11  1343   Chief Complaint  Patient presents with  . Fatigue    (Consider location/radiation/quality/duration/timing/severity/associated sxs/prior treatment) HPI Comments: Patient lives at home with a relative. Patient relative states patient has had gradually worsening weakness for the past three days with associated left leg pain. Relative does note, however, that patient is not ambulatory and does have hx for phlebitis.    Patient is a 78 y.o. female presenting with weakness. The history is provided by a relative and the patient. No language interpreter was used.  Weakness Primary symptoms do not include headaches, loss of consciousness, seizures, focal weakness, fever, nausea or vomiting. The symptoms began 3 to 5 days ago. The symptoms are worsening. The neurological symptoms are diffuse. Context: worse while standing.  Additional symptoms include weakness and leg pain (left ). Additional symptoms do not include pain, hallucinations or hearing loss. Medical issues do not include hypertension. Associated medical issues comments: Hypotension.    Past Medical History  Diagnosis Date  . Hypotension   . Phlebitis   . Hypotension     Past Surgical History  Procedure Date  . Esophageal dilation     No family history on file.  History  Substance Use Topics  . Smoking status: Not on file  . Smokeless tobacco: Not on file  . Alcohol Use: No     Review of Systems  Constitutional: Negative for fever and fatigue.  HENT: Negative for hearing loss, congestion, sinus pressure and ear discharge.   Eyes: Negative for discharge.  Respiratory: Negative for cough.   Cardiovascular: Negative for chest pain.  Gastrointestinal: Negative for nausea, vomiting, abdominal pain and diarrhea.  Genitourinary:  Negative for frequency and hematuria.  Musculoskeletal: Negative for back pain.  Skin: Negative for rash.  Neurological: Positive for weakness. Negative for focal weakness, seizures, loss of consciousness and headaches.  Hematological: Negative.   Psychiatric/Behavioral: Negative for hallucinations.  All other systems reviewed and are negative.    Allergies  Review of patient's allergies indicates no known allergies.  Home Medications   Current Outpatient Rx  Name Route Sig Dispense Refill  . ALPRAZOLAM 0.5 MG PO TABS Oral Take 0.5 mg by mouth 4 (four) times daily as needed.     . ASPIRIN EC 81 MG PO TBEC Oral Take 81 mg by mouth daily.    . FUROSEMIDE 40 MG PO TABS Oral Take 40 mg by mouth daily.    Marland Kitchen GABAPENTIN 300 MG PO CAPS Oral Take 300 mg by mouth at bedtime.    Marland Kitchen LEVOTHYROXINE SODIUM 88 MCG PO TABS Oral Take 88 mcg by mouth daily.    . ADULT MULTIVITAMIN W/MINERALS CH Oral Take 1 tablet by mouth daily.    Marland Kitchen OMEPRAZOLE 20 MG PO CPDR Oral Take 20 mg by mouth daily.    Marland Kitchen PRAVASTATIN SODIUM 40 MG PO TABS Oral Take 80 mg by mouth daily.     Marland Kitchen RISPERIDONE 0.5 MG PO TABS Oral Take 0.5 mg by mouth 2 (two) times daily.    Marland Kitchen ROPINIROLE HCL 0.5 MG PO TABS Oral Take 0.5 mg by mouth at bedtime.     . SULFAMETHOXAZOLE-TRIMETHOPRIM 800-160 MG PO TABS Oral Take 1 tablet by mouth 2 (two) times daily. For 10 days      BP 93/54  Pulse  97  Temp 98 F (36.7 C) (Oral)  Resp 16  Ht 5\' 7"  (1.702 m)  Wt 145 lb (65.772 kg)  BMI 22.71 kg/m2  SpO2 95%  Physical Exam  Nursing note and vitals reviewed. Constitutional: She is oriented to person, place, and time. She appears well-developed.  HENT:  Head: Normocephalic and atraumatic.       Dry mucous membranes  Eyes: Conjunctivae and EOM are normal. No scleral icterus.  Neck: Neck supple. No thyromegaly present.  Cardiovascular: Normal rate and regular rhythm.  Exam reveals no gallop and no friction rub.   No murmur heard. Pulmonary/Chest:  No stridor. She has no wheezes. She has no rales. She exhibits no tenderness.  Abdominal: She exhibits no distension. There is no tenderness. There is no rebound.  Musculoskeletal: Normal range of motion. She exhibits no edema.       Profound weakness in lower extremities. Moderate weakness in upper extremities.   Lymphadenopathy:    She has no cervical adenopathy.  Neurological: She is oriented to person, place, and time. Coordination normal.  Skin: No rash noted. No erythema.  Psychiatric: She has a normal mood and affect. Her behavior is normal.    ED Course  Procedures (including critical care time) DIAGNOSTIC STUDIES: Oxygen Saturation is 95% on room air, normal by my interpretation.    COORDINATION OF CARE: 15:09: Physical exam started. 15:13: Physical exam completed. Patient and family made aware of intent to perform tests and lab workup. Patient and family agreeable at this time. 16:51: Notified patient of lab results and provides that weakness is due to possible dehydration.  16:55: Dr. Luan Pulling notified of patient and case discussed. He agrees to come in at this time for further patient eval.  ED MEDICATION ORDERS 1530: Sodium chloride 0.9 % bolus 1,000 mL  Labs Reviewed  CBC WITH DIFFERENTIAL - Abnormal; Notable for the following:    RBC 2.90 (*)     Hemoglobin 8.9 (*)     HCT 26.4 (*)     Monocytes Relative 14 (*)     All other components within normal limits  COMPREHENSIVE METABOLIC PANEL - Abnormal; Notable for the following:    BUN 40 (*)     Creatinine, Ser 2.49 (*)     Total Bilirubin 0.2 (*)     GFR calc non Af Amer 16 (*)     GFR calc Af Amer 18 (*)     All other components within normal limits  URINALYSIS, ROUTINE W REFLEX MICROSCOPIC  TROPONIN I   Dg Chest Port 1 View  10/23/2011  *RADIOLOGY REPORT*  Clinical Data: Weakness  PORTABLE CHEST - 1 VIEW  Comparison: Portable exam 1545 hours compared to 09/30/2011  Findings: Rotated to the left. Normal heart  size, mediastinal contours and pulmonary vascularity. Atherosclerotic calcifications aorta. Accentuation of right basilar markings appears unchanged, suspect related to rotation, unchanged when accounting for differences in technique. Underlying emphysematous changes. No acute infiltrate, pleural effusion or pneumothorax. Bones appear diffusely demineralized.  IMPRESSION: Emphysematous changes. No acute abnormalities.   Original Report Authenticated By: Burnetta Sabin, M.D.     No diagnosis found.  CRITICAL CARE Performed by: Eniola Cerullo L   Total critical care time:35  Critical care time was exclusive of separately billable procedures and treating other patients.  Critical care was necessary to treat or prevent imminent or life-threatening deterioration.  Critical care was time spent personally by me on the following activities: development of treatment plan with patient and/or surrogate  as well as nursing, discussions with consultants, evaluation of patient's response to treatment, examination of patient, obtaining history from patient or surrogate, ordering and performing treatments and interventions, ordering and review of laboratory studies, ordering and review of radiographic studies, pulse oximetry and re-evaluation of patient's condition.   MDM  The chart was scribed for me under my direct supervision.  I personally performed the history, physical, and medical decision making and all procedures in the evaluation of this patient.Maudry Diego, MD 10/28/11 [id number]  Maudry Diego, MD 11/19/11 [phone]

## 2011-10-24 DIAGNOSIS — E039 Hypothyroidism, unspecified: Secondary | ICD-10-CM | POA: Diagnosis present

## 2011-10-24 DIAGNOSIS — R627 Adult failure to thrive: Secondary | ICD-10-CM | POA: Diagnosis present

## 2011-10-24 DIAGNOSIS — E869 Volume depletion, unspecified: Secondary | ICD-10-CM | POA: Diagnosis not present

## 2011-10-24 DIAGNOSIS — D509 Iron deficiency anemia, unspecified: Secondary | ICD-10-CM | POA: Diagnosis present

## 2011-10-24 DIAGNOSIS — N19 Unspecified kidney failure: Secondary | ICD-10-CM | POA: Diagnosis not present

## 2011-10-24 DIAGNOSIS — E860 Dehydration: Secondary | ICD-10-CM | POA: Diagnosis present

## 2011-10-24 DIAGNOSIS — F419 Anxiety disorder, unspecified: Secondary | ICD-10-CM | POA: Diagnosis present

## 2011-10-24 DIAGNOSIS — N189 Chronic kidney disease, unspecified: Secondary | ICD-10-CM | POA: Diagnosis present

## 2011-10-24 LAB — BASIC METABOLIC PANEL
Chloride: 107 mEq/L (ref 96–112)
Creatinine, Ser: 1.79 mg/dL — ABNORMAL HIGH (ref 0.50–1.10)
GFR calc Af Amer: 27 mL/min — ABNORMAL LOW (ref >90)
GFR calc non Af Amer: 23 mL/min — ABNORMAL LOW (ref >90)

## 2011-10-24 LAB — VITAMIN B12: Vitamin B-12: 568 pg/mL (ref 211–911)

## 2011-10-24 LAB — CBC
HCT: 25.6 % — ABNORMAL LOW (ref 36.0–46.0)
Platelets: 212 10*3/uL (ref 150–400)
RDW: 13.5 % (ref 11.5–15.5)
WBC: 5.4 10*3/uL (ref 4.0–10.5)

## 2011-10-24 LAB — FOLATE: Folate: >20 ng/mL

## 2011-10-24 LAB — IRON AND TIBC: Iron: 34 ug/dL — ABNORMAL LOW (ref 42–135)

## 2011-10-24 MED ORDER — MAGNESIUM HYDROXIDE 400 MG/5ML PO SUSP
30.0000 mL | Freq: Every day | ORAL | Status: DC | PRN
  Administered 2011-10-24 – 2011-10-26 (×2): 30 mL via ORAL
  Filled 2011-10-24 (×2): qty 30

## 2011-10-24 NOTE — Progress Notes (Signed)
Clinical Social Work Department BRIEF PSYCHOSOCIAL ASSESSMENT 10/24/2011  Patient:  Christine Hancock, Christine Hancock     Account Number:  [id number]     Admit date:  10/23/2011  Clinical Social Worker:  Lacie Scotts  Date/Time:  10/24/2011 03:00 PM  Referred by:  RN  Date Referred:  10/24/2011 Referred for  SNF Placement   Other Referral:   Interview type:  Patient Other interview type:    PSYCHOSOCIAL DATA Living Status:  FAMILY Admitted from facility:   Level of care:   Primary support name:  Earlie Lou Primary support relationship to patient:  SIBLING Degree of support available:   supportive    CURRENT CONCERNS Current Concerns  Post-Acute Placement   Other Concerns:    SOCIAL WORK ASSESSMENT / PLAN Pt is a 78 yr old female living at home with her brother prior to hospitalization. CSW met with pt/brother to assist with d/c planning. Pt's care can no longer be managed at home and SNF placement is needed at this time. Pt is capable of decision making and brother would like pt to be involved with  d/c planning to SNF. CSW has initiated SNF search in [place].   Assessment/plan status:  Psychosocial Support/Ongoing Assessment of Needs Other assessment/ plan:   Information/referral to community resources:   SNF list [facility] ) provided. Verbal info regarding medicare/medicaid coverage in SNF's provided.    PATIENT'S/FAMILY'S RESPONSE TO PLAN OF CARE: Both pt and family feel placement is needed at this time.   Werner Lean LCSW [phone]

## 2011-10-24 NOTE — Progress Notes (Signed)
Patient was complaining of constipation and stated she uses milk of magnesia at home. Doctor was notified and new orders were given.

## 2011-10-24 NOTE — Progress Notes (Signed)
Subjective: She was admitted yesterday with failure to thrive anemia and acute on chronic renal failure and dehydration. She looks better this morning. She has no new complaints. She is still very weak.  Objective: Vital signs in last 24 hours: Temp:  [97.6 F (36.4 C)-98 F (36.7 C)] 97.6 F (36.4 C) (09/01 0543) Pulse Rate:  [64-97] 64  (09/01 0543) Resp:  [16] 16  (09/01 0543) BP: (91-161)/(54-76) 119/73 mmHg (09/01 0543) SpO2:  [95 %-96 %] 95 % (09/01 0543) Weight:  [60.918 kg (134 lb 4.8 oz)-65.772 kg (145 lb)] 61.145 kg (134 lb 12.8 oz) (09/01 0543) Weight change:     Intake/Output from previous day: 08/31 0701 - 09/01 0700 In: 240 [P.O.:240] Out: -   PHYSICAL EXAM General appearance: alert, cooperative and no distress Resp: clear to auscultation bilaterally Cardio: regular rate and rhythm, S1, S2 normal, no murmur, click, rub or gallop GI: soft, non-tender; bowel sounds normal; no masses,  no organomegaly Extremities: extremities normal, atraumatic, no cyanosis or edema  Lab Results:    Basic Metabolic Panel:  Basename 10/24/11 0544 10/23/11 1445  NA 138 137  K 4.2 4.3  CL 107 101  CO2 24 25  GLUCOSE 77 78  BUN 30* 40*  CREATININE 1.79* 2.49*  CALCIUM 8.8 9.6  MG -- --  PHOS -- --   Liver Function Tests:  Basename 10/23/11 1445  AST 24  ALT 12  ALKPHOS 77  BILITOT 0.2*  PROT 7.1  ALBUMIN 3.7   No results found for this basename: LIPASE:2,AMYLASE:2 in the last 72 hours No results found for this basename: AMMONIA:2 in the last 72 hours CBC:  Basename 10/24/11 0544 10/23/11 1445  WBC 5.4 5.1  NEUTROABS -- 3.2  HGB 8.2* 8.9*  HCT 25.6* 26.4*  MCV 92.1 91.0  PLT 212 240   Cardiac Enzymes:  Basename 10/23/11 1518  CKTOTAL --  CKMB --  CKMBINDEX --  TROPONINI <0.30   BNP: No results found for this basename: PROBNP:3 in the last 72 hours D-Dimer: No results found for this basename: DDIMER:2 in the last 72 hours CBG: No results found for  this basename: GLUCAP:6 in the last 72 hours Hemoglobin A1C: No results found for this basename: HGBA1C in the last 72 hours Fasting Lipid Panel: No results found for this basename: CHOL,HDL,LDLCALC,TRIG,CHOLHDL,LDLDIRECT in the last 72 hours Thyroid Function Tests: No results found for this basename: TSH,T4TOTAL,FREET4,T3FREE,THYROIDAB in the last 72 hours Anemia Panel:  Basename 10/23/11 1445  VITAMINB12 --  FOLATE --  FERRITIN --  TIBC --  IRON --  RETICCTPCT 1.0   Coagulation: No results found for this basename: LABPROT:2,INR:2 in the last 72 hours Urine Drug Screen: Drugs of Abuse  No results found for this basename: labopia, cocainscrnur, labbenz, amphetmu, thcu, labbarb    Alcohol Level: No results found for this basename: ETH:2 in the last 72 hours Urinalysis:  Basename 10/23/11 1500  COLORURINE YELLOW  LABSPEC 1.010  PHURINE 7.0  GLUCOSEU NEGATIVE  HGBUR NEGATIVE  BILIRUBINUR NEGATIVE  KETONESUR NEGATIVE  PROTEINUR NEGATIVE  UROBILINOGEN 0.2  NITRITE NEGATIVE  LEUKOCYTESUR NEGATIVE   Misc. Labs:  ABGS No results found for this basename: PHART,PCO2,PO2ART,TCO2,HCO3 in the last 72 hours CULTURES No results found for this or any previous visit (from the past 240 hour(s)). Studies/Results: Ct Head Wo Contrast  10/23/2011  *RADIOLOGY REPORT*  Clinical Data: Fatigue and weakness.  CT HEAD WITHOUT CONTRAST  Technique:  Contiguous axial images were obtained from the base of the skull  through the vertex without contrast.  Comparison: None.  Findings: Mild generalized atrophy is likely within normal limits for age.  No acute cortical infarct, hemorrhage, or mass lesion is present.  The ventricles are proportionate to the degree of atrophy.  The paranasal sinuses and mastoid air cells are clear.  The osseous skull is intact.  The mild atherosclerotic calcifications are present in the cavernous carotid arteries bilaterally.  IMPRESSION: Normal CT of the head for age.    Original Report Authenticated By: Resa Miner. MATTERN, M.D.    Dg Chest Port 1 View  10/23/2011  *RADIOLOGY REPORT*  Clinical Data: Weakness  PORTABLE CHEST - 1 VIEW  Comparison: Portable exam 1545 hours compared to 09/30/2011  Findings: Rotated to the left. Normal heart size, mediastinal contours and pulmonary vascularity. Atherosclerotic calcifications aorta. Accentuation of right basilar markings appears unchanged, suspect related to rotation, unchanged when accounting for differences in technique. Underlying emphysematous changes. No acute infiltrate, pleural effusion or pneumothorax. Bones appear diffusely demineralized.  IMPRESSION: Emphysematous changes. No acute abnormalities.   Original Report Authenticated By: Burnetta Sabin, M.D.     Medications:  Prior to Admission:  Prescriptions prior to admission  Medication Sig Dispense Refill  . ALPRAZolam (XANAX) 0.5 MG tablet Take 0.5 mg by mouth 4 (four) times daily as needed.       Marland Kitchen aspirin EC 81 MG tablet Take 81 mg by mouth daily.      . furosemide (LASIX) 40 MG tablet Take 40 mg by mouth daily.      Marland Kitchen gabapentin (NEURONTIN) 300 MG capsule Take 300 mg by mouth at bedtime.      Marland Kitchen levothyroxine (SYNTHROID, LEVOTHROID) 88 MCG tablet Take 88 mcg by mouth daily.      . Multiple Vitamin (MULTIVITAMIN WITH MINERALS) TABS Take 1 tablet by mouth daily.      Marland Kitchen omeprazole (PRILOSEC) 20 MG capsule Take 20 mg by mouth daily.      . pravastatin (PRAVACHOL) 40 MG tablet Take 80 mg by mouth daily.       . risperiDONE (RISPERDAL) 0.5 MG tablet Take 0.5 mg by mouth 2 (two) times daily.      Marland Kitchen rOPINIRole (REQUIP) 0.5 MG tablet Take 0.5 mg by mouth at bedtime.       . sulfamethoxazole-trimethoprim (BACTRIM DS,SEPTRA DS) 800-160 MG per tablet Take 1 tablet by mouth 2 (two) times daily. For 10 days       Scheduled:   . docusate sodium  100 mg Oral BID  . enoxaparin (LOVENOX) injection  30 mg Subcutaneous Q24H  . gabapentin  300 mg Oral QHS  .  levothyroxine  88 mcg Oral Daily  . pantoprazole  40 mg Oral Q1200  . risperiDONE  0.5 mg Oral BID  . rOPINIRole  0.5 mg Oral QHS  . simvastatin  40 mg Oral q1800  . sodium chloride  1,000 mL Intravenous Once  . sodium chloride  1,000 mL Intravenous Once   Continuous:   . sodium chloride 125 mL/hr at 10/24/11 0859   [id number], acetaminophen, ALPRAZolam, alum & mag hydroxide-simeth, HYDROcodone-acetaminophen, ondansetron (ZOFRAN) IV, ondansetron, traZODone  Assesment: She has failure to thrive and acute on chronic renal failure possibly related to dehydration. She is better. Her renal function is better. She is anemic and her hemoglobin level is stable. Anemia profile is pending Active Problems:  * No active hospital problems. *     Plan: I will decrease her IV fluids somewhat continue with all the breast  for medications ask for physical therapy consultation    LOS: 1 day   Griffen Frayne L 10/24/2011, 10:35 AM

## 2011-10-25 NOTE — Progress Notes (Signed)
Subjective: She looks better. She is still weak. She has no new complaints.  Objective: Vital signs in last 24 hours: Temp:  [98.2 F (36.8 C)-98.8 F (37.1 C)] 98.2 F (36.8 C) (09/02 0559) Pulse Rate:  [71-73] 72  (09/02 0559) Resp:  [16] 16  (09/02 0559) BP: (101-121)/(62-72) 121/68 mmHg (09/02 0559) SpO2:  [92 %-97 %] 96 % (09/02 0559) Weight:  [62.007 kg (136 lb 11.2 oz)] 62.007 kg (136 lb 11.2 oz) (09/02 0559) Weight change: -3.765 kg (-8 lb 4.8 oz) Last BM Date: 10/22/11  Intake/Output from previous day: 09/01 0701 - 09/02 0700 In: 2660 [P.O.:960; I.V.:1700] Out: 1000 [Urine:1000]  PHYSICAL EXAM General appearance: alert, cooperative and no distress Resp: clear to auscultation bilaterally Cardio: regular rate and rhythm, S1, S2 normal, no murmur, click, rub or gallop GI: soft, non-tender; bowel sounds normal; no masses,  no organomegaly Extremities: extremities normal, atraumatic, no cyanosis or edema  Lab Results:    Basic Metabolic Panel:  Basename 10/24/11 0544 10/23/11 1445  NA 138 137  K 4.2 4.3  CL 107 101  CO2 24 25  GLUCOSE 77 78  BUN 30* 40*  CREATININE 1.79* 2.49*  CALCIUM 8.8 9.6  MG -- --  PHOS -- --   Liver Function Tests:  Basename 10/23/11 1445  AST 24  ALT 12  ALKPHOS 77  BILITOT 0.2*  PROT 7.1  ALBUMIN 3.7   No results found for this basename: LIPASE:2,AMYLASE:2 in the last 72 hours No results found for this basename: AMMONIA:2 in the last 72 hours CBC:  Basename 10/24/11 0544 10/23/11 1445  WBC 5.4 5.1  NEUTROABS -- 3.2  HGB 8.2* 8.9*  HCT 25.6* 26.4*  MCV 92.1 91.0  PLT 212 240   Cardiac Enzymes:  Basename 10/23/11 1518  CKTOTAL --  CKMB --  CKMBINDEX --  TROPONINI <0.30   BNP: No results found for this basename: PROBNP:3 in the last 72 hours D-Dimer: No results found for this basename: DDIMER:2 in the last 72 hours CBG: No results found for this basename: GLUCAP:6 in the last 72 hours Hemoglobin A1C: No  results found for this basename: HGBA1C in the last 72 hours Fasting Lipid Panel: No results found for this basename: CHOL,HDL,LDLCALC,TRIG,CHOLHDL,LDLDIRECT in the last 72 hours Thyroid Function Tests: No results found for this basename: TSH,T4TOTAL,FREET4,T3FREE,THYROIDAB in the last 72 hours Anemia Panel:  Basename 10/23/11 1445  VITAMINB12 568  FOLATE >20.0  FERRITIN 374*  TIBC 256  IRON 34*  RETICCTPCT 1.0   Coagulation: No results found for this basename: LABPROT:2,INR:2 in the last 72 hours Urine Drug Screen: Drugs of Abuse  No results found for this basename: labopia, cocainscrnur, labbenz, amphetmu, thcu, labbarb    Alcohol Level: No results found for this basename: ETH:2 in the last 72 hours Urinalysis:  Basename 10/23/11 1500  COLORURINE YELLOW  LABSPEC 1.010  PHURINE 7.0  GLUCOSEU NEGATIVE  HGBUR NEGATIVE  BILIRUBINUR NEGATIVE  KETONESUR NEGATIVE  PROTEINUR NEGATIVE  UROBILINOGEN 0.2  NITRITE NEGATIVE  LEUKOCYTESUR NEGATIVE   Misc. Labs:  ABGS No results found for this basename: PHART,PCO2,PO2ART,TCO2,HCO3 in the last 72 hours CULTURES No results found for this or any previous visit (from the past 240 hour(s)). Studies/Results: Ct Head Wo Contrast  10/23/2011  *RADIOLOGY REPORT*  Clinical Data: Fatigue and weakness.  CT HEAD WITHOUT CONTRAST  Technique:  Contiguous axial images were obtained from the base of the skull through the vertex without contrast.  Comparison: None.  Findings: Mild generalized atrophy is likely  within normal limits for age.  No acute cortical infarct, hemorrhage, or mass lesion is present.  The ventricles are proportionate to the degree of atrophy.  The paranasal sinuses and mastoid air cells are clear.  The osseous skull is intact.  The mild atherosclerotic calcifications are present in the cavernous carotid arteries bilaterally.  IMPRESSION: Normal CT of the head for age.   Original Report Authenticated By: Resa Miner. MATTERN,  M.D.    Dg Chest Port 1 View  10/23/2011  *RADIOLOGY REPORT*  Clinical Data: Weakness  PORTABLE CHEST - 1 VIEW  Comparison: Portable exam 1545 hours compared to 09/30/2011  Findings: Rotated to the left. Normal heart size, mediastinal contours and pulmonary vascularity. Atherosclerotic calcifications aorta. Accentuation of right basilar markings appears unchanged, suspect related to rotation, unchanged when accounting for differences in technique. Underlying emphysematous changes. No acute infiltrate, pleural effusion or pneumothorax. Bones appear diffusely demineralized.  IMPRESSION: Emphysematous changes. No acute abnormalities.   Original Report Authenticated By: Burnetta Sabin, M.D.     Medications:  Prior to Admission:  Prescriptions prior to admission  Medication Sig Dispense Refill  . ALPRAZolam (XANAX) 0.5 MG tablet Take 0.5 mg by mouth 4 (four) times daily as needed.       Marland Kitchen aspirin EC 81 MG tablet Take 81 mg by mouth daily.      . furosemide (LASIX) 40 MG tablet Take 40 mg by mouth daily.      Marland Kitchen gabapentin (NEURONTIN) 300 MG capsule Take 300 mg by mouth at bedtime.      Marland Kitchen levothyroxine (SYNTHROID, LEVOTHROID) 88 MCG tablet Take 88 mcg by mouth daily.      . Multiple Vitamin (MULTIVITAMIN WITH MINERALS) TABS Take 1 tablet by mouth daily.      Marland Kitchen omeprazole (PRILOSEC) 20 MG capsule Take 20 mg by mouth daily.      . pravastatin (PRAVACHOL) 40 MG tablet Take 80 mg by mouth daily.       . risperiDONE (RISPERDAL) 0.5 MG tablet Take 0.5 mg by mouth 2 (two) times daily.      Marland Kitchen rOPINIRole (REQUIP) 0.5 MG tablet Take 0.5 mg by mouth at bedtime.       . sulfamethoxazole-trimethoprim (BACTRIM DS,SEPTRA DS) 800-160 MG per tablet Take 1 tablet by mouth 2 (two) times daily. For 10 days       Scheduled:   . docusate sodium  100 mg Oral BID  . enoxaparin (LOVENOX) injection  30 mg Subcutaneous Q24H  . gabapentin  300 mg Oral QHS  . levothyroxine  88 mcg Oral Daily  . pantoprazole  40 mg Oral Q1200   . risperiDONE  0.5 mg Oral BID  . rOPINIRole  0.5 mg Oral QHS  . simvastatin  40 mg Oral q1800   Continuous:   . sodium chloride 75 mL/hr at 10/24/11 2019   [id number], acetaminophen, ALPRAZolam, alum & mag hydroxide-simeth, HYDROcodone-acetaminophen, magnesium hydroxide, ondansetron (ZOFRAN) IV, ondansetron, traZODone  Assesment: She was admitted with dehydration. She has failure to thrive. She has acute on chronic renal failure which has improved. Her other problems are stable Active Problems:  Dehydration  Acute on chronic renal failure  Failure to thrive in adult  Anemia  Anxiety  Hypothyroidism    Plan: She is currently undergoing physical therapy evaluation. I think she's going to require skilled care facility placement    LOS: 2 days   Cartier Washko L 10/25/2011, 10:26 AM

## 2011-10-25 NOTE — Progress Notes (Signed)
UR chart review completed.  

## 2011-10-25 NOTE — Evaluation (Signed)
Physical Therapy Evaluation Patient Details Name: Christine Hancock MRN: [id number] DOB: [birth year] Today's Date: 10/25/2011 Time: [id number] PT Time Calculation (min): 42 min  PT Assessment / Plan / Recommendation Clinical Impression  Pt is very pleasant and cooperative but extremely deconditioned due to recent illness.  She needed mod assist to mobilize OOB, only able to ambulate 29' with walker (had been able to ambulate with no AD up until  recently).  She will need SNF at d/c and she is in agreement.    PT Assessment  Patient needs continued PT services    Follow Up Recommendations  Skilled nursing facility    Barriers to Discharge Decreased caregiver support brother is unable to continue to care for her    Equipment Recommendations  Defer to next venue    Recommendations for Other Services OT consult   Frequency Min 3X/week    Precautions / Restrictions Precautions Precautions: Fall Restrictions Weight Bearing Restrictions: No   Pertinent Vitals/Pain       Mobility  Bed Mobility Bed Mobility: Supine to Sit;Sit to Supine;Sitting - Scoot to Edge of Bed Supine to Sit: 3: Mod assist;HOB elevated;With rails Sitting - Scoot to Edge of Bed: 3: Mod assist;With rail Sit to Supine: Not Tested (comment) Transfers Transfers: Sit to Stand;Stand to Sit Sit to Stand: 4: Min assist;With upper extremity assist;From bed Stand to Sit: 4: Min assist;With upper extremity assist;To chair/3-in-1 Ambulation/Gait Ambulation/Gait Assistance: 4: Min assist Ambulation Distance (Feet): 60 Feet Assistive device: Rolling walker Gait Pattern: Step-through pattern;Decreased stride length;Decreased hip/knee flexion - right;Decreased hip/knee flexion - left;Shuffle;Trunk flexed Gait velocity: very slow initially, then speed picks up to WNL Stairs: No Wheelchair Mobility Wheelchair Mobility: No    Exercises General Exercises - Lower Extremity Ankle Circles/Pumps: AROM;Both;10 reps;Supine Short Arc  Quad: AAROM;Both;10 reps;Supine Heel Slides: AAROM;Both;10 reps;Supine Hip ABduction/ADduction: AAROM;Both;10 reps;Supine   PT Diagnosis: Difficulty walking;Generalized weakness  PT Problem List: Decreased strength;Decreased activity tolerance;Decreased mobility;Decreased knowledge of use of DME;Decreased safety awareness;Decreased knowledge of precautions PT Treatment Interventions: Gait training;Functional mobility training;Therapeutic activities;Therapeutic exercise;Patient/family education   PT Goals Acute Rehab PT Goals PT Goal Formulation: With patient Time For Goal Achievement: 11/08/11 Potential to Achieve Goals: Good Pt will go Supine/Side to Sit: with min assist;with rail;with HOB not 0 degrees (comment degree) PT Goal: Supine/Side to Sit - Progress: Goal set today Pt will go Sit to Stand: with supervision;with upper extremity assist PT Goal: Sit to Stand - Progress: Goal set today Pt will go Stand to Sit: with supervision;with upper extremity assist PT Goal: Stand to Sit - Progress: Goal set today Pt will Ambulate: 51 - 150 feet;with supervision;with rolling walker PT Goal: Ambulate - Progress: Goal set today  Visit Information  Last PT Received On: 10/25/11    Subjective Data  Subjective: I have gotten weak Patient Stated Goal: wants to get stronger   Prior Functioning  Home Living Lives With: Family Available Help at Discharge: Family Type of Home: House Home Access: Stairs to enter [employer] of Steps: 3 steps to deck (with railing), then 3 steps to door (no rail) Home Layout: One level Biochemist, clinical: Standard Home Adaptive Equipment: Straight cane Prior Function Level of Independence: Independent Able to Take Stairs?: Yes Driving: No Vocation: Retired Corporate investment banker: No difficulties    Cognition  Overall Cognitive Status: Appears within functional limits for tasks assessed/performed Arousal/Alertness: Awake/alert Orientation  Level: Appears intact for tasks assessed Behavior During Session: [facility] for tasks performed    Extremity/Trunk Assessment Right Lower  Extremity Assessment RLE ROM/Strength/Tone: Deficits RLE ROM/Strength/Tone Deficits: strength generally 3/5 RLE Sensation: WFL - Light Touch Left Lower Extremity Assessment LLE ROM/Strength/Tone: Deficits LLE ROM/Strength/Tone Deficits: strength generally 2/5 with significant rigidity of joints, musculature Trunk Assessment Trunk Assessment: Kyphotic   Balance Balance Balance Assessed: No  End of Session PT - End of Session Equipment Utilized During Treatment: Gait belt Activity Tolerance: Patient tolerated treatment well;Patient limited by fatigue Patient left: in chair;with call bell/phone within reach;with chair alarm set Nurse Communication: Mobility status  GP     Sable Feil 10/25/2011, 10:37 AM

## 2011-10-26 ENCOUNTER — Encounter (HOSPITAL_COMMUNITY): Payer: Self-pay | Admitting: Urgent Care

## 2011-10-26 DIAGNOSIS — D509 Iron deficiency anemia, unspecified: Secondary | ICD-10-CM

## 2011-10-26 MED ORDER — SODIUM CHLORIDE 0.9 % IJ SOLN
INTRAMUSCULAR | Status: AC
  Administered 2011-10-26: 15:00:00
  Filled 2011-10-26: qty 3

## 2011-10-26 NOTE — Progress Notes (Signed)
Subjective: She is about the same. She has no new complaints. She says she still very weak.  Objective: Vital signs in last 24 hours: Temp:  [98.2 F (36.8 C)-99 F (37.2 C)] 99 F (37.2 C) (09/03 0500) Pulse Rate:  [69-78] 70  (09/03 0500) Resp:  [18-20] 20  (09/03 0500) BP: (112-132)/(66-78) 122/75 mmHg (09/03 0500) SpO2:  [94 %-99 %] 94 % (09/03 0500) Weight:  [62.687 kg (138 lb 3.2 oz)] 62.687 kg (138 lb 3.2 oz) (09/03 0500) Weight change: 0.68 kg (1 lb 8 oz) Last BM Date: 10/25/11  Intake/Output from previous day: 09/02 0701 - 09/03 0700 In: 480 [P.O.:480] Out: -   PHYSICAL EXAM General appearance: alert, cooperative and no distress Resp: clear to auscultation bilaterally Cardio: regular rate and rhythm, S1, S2 normal, no murmur, click, rub or gallop GI: soft, non-tender; bowel sounds normal; no masses,  no organomegaly Extremities: extremities normal, atraumatic, no cyanosis or edema  Lab Results:    Basic Metabolic Panel:  Basename 10/24/11 0544 10/23/11 1445  NA 138 137  K 4.2 4.3  CL 107 101  CO2 24 25  GLUCOSE 77 78  BUN 30* 40*  CREATININE 1.79* 2.49*  CALCIUM 8.8 9.6  MG -- --  PHOS -- --   Liver Function Tests:  Basename 10/23/11 1445  AST 24  ALT 12  ALKPHOS 77  BILITOT 0.2*  PROT 7.1  ALBUMIN 3.7   No results found for this basename: LIPASE:2,AMYLASE:2 in the last 72 hours No results found for this basename: AMMONIA:2 in the last 72 hours CBC:  Basename 10/24/11 0544 10/23/11 1445  WBC 5.4 5.1  NEUTROABS -- 3.2  HGB 8.2* 8.9*  HCT 25.6* 26.4*  MCV 92.1 91.0  PLT 212 240   Cardiac Enzymes:  Basename 10/23/11 1518  CKTOTAL --  CKMB --  CKMBINDEX --  TROPONINI <0.30   BNP: No results found for this basename: PROBNP:3 in the last 72 hours D-Dimer: No results found for this basename: DDIMER:2 in the last 72 hours CBG: No results found for this basename: GLUCAP:6 in the last 72 hours Hemoglobin A1C: No results found for this  basename: HGBA1C in the last 72 hours Fasting Lipid Panel: No results found for this basename: CHOL,HDL,LDLCALC,TRIG,CHOLHDL,LDLDIRECT in the last 72 hours Thyroid Function Tests: No results found for this basename: TSH,T4TOTAL,FREET4,T3FREE,THYROIDAB in the last 72 hours Anemia Panel:  Basename 10/23/11 1445  VITAMINB12 568  FOLATE >20.0  FERRITIN 374*  TIBC 256  IRON 34*  RETICCTPCT 1.0   Coagulation: No results found for this basename: LABPROT:2,INR:2 in the last 72 hours Urine Drug Screen: Drugs of Abuse  No results found for this basename: labopia, cocainscrnur, labbenz, amphetmu, thcu, labbarb    Alcohol Level: No results found for this basename: ETH:2 in the last 72 hours Urinalysis:  Basename 10/23/11 1500  COLORURINE YELLOW  LABSPEC 1.010  PHURINE 7.0  GLUCOSEU NEGATIVE  HGBUR NEGATIVE  BILIRUBINUR NEGATIVE  KETONESUR NEGATIVE  PROTEINUR NEGATIVE  UROBILINOGEN 0.2  NITRITE NEGATIVE  LEUKOCYTESUR NEGATIVE   Misc. Labs:  ABGS No results found for this basename: PHART,PCO2,PO2ART,TCO2,HCO3 in the last 72 hours CULTURES No results found for this or any previous visit (from the past 240 hour(s)). Studies/Results: No results found.  Medications:  Prior to Admission:  Prescriptions prior to admission  Medication Sig Dispense Refill  . ALPRAZolam (XANAX) 0.5 MG tablet Take 0.5 mg by mouth 4 (four) times daily as needed.       Marland Kitchen aspirin EC  81 MG tablet Take 81 mg by mouth daily.      . furosemide (LASIX) 40 MG tablet Take 40 mg by mouth daily.      Marland Kitchen gabapentin (NEURONTIN) 300 MG capsule Take 300 mg by mouth at bedtime.      Marland Kitchen levothyroxine (SYNTHROID, LEVOTHROID) 88 MCG tablet Take 88 mcg by mouth daily.      . Multiple Vitamin (MULTIVITAMIN WITH MINERALS) TABS Take 1 tablet by mouth daily.      Marland Kitchen omeprazole (PRILOSEC) 20 MG capsule Take 20 mg by mouth daily.      . pravastatin (PRAVACHOL) 40 MG tablet Take 80 mg by mouth daily.       . risperiDONE  (RISPERDAL) 0.5 MG tablet Take 0.5 mg by mouth 2 (two) times daily.      Marland Kitchen rOPINIRole (REQUIP) 0.5 MG tablet Take 0.5 mg by mouth at bedtime.       . sulfamethoxazole-trimethoprim (BACTRIM DS,SEPTRA DS) 800-160 MG per tablet Take 1 tablet by mouth 2 (two) times daily. For 10 days       Scheduled:   . docusate sodium  100 mg Oral BID  . enoxaparin (LOVENOX) injection  30 mg Subcutaneous Q24H  . gabapentin  300 mg Oral QHS  . levothyroxine  88 mcg Oral Daily  . pantoprazole  40 mg Oral Q1200  . risperiDONE  0.5 mg Oral BID  . rOPINIRole  0.5 mg Oral QHS  . simvastatin  40 mg Oral q1800   Continuous:   . sodium chloride 75 mL/hr at 10/25/11 2252   [id number], acetaminophen, ALPRAZolam, alum & mag hydroxide-simeth, HYDROcodone-acetaminophen, magnesium hydroxide, ondansetron (ZOFRAN) IV, ondansetron, traZODone  Assesment: She was admitted with failure to thrive dehydration and acute on chronic renal failure and has improved. She is very weak and is going to require skilled care facility placement Active Problems:  Dehydration  Acute on chronic renal failure  Failure to thrive in adult  Anemia  Anxiety  Hypothyroidism    Plan: To skilled care facility when bed is available    LOS: 3 days   Adonnis Salceda L 10/26/2011, 8:29 AM

## 2011-10-26 NOTE — Clinical Social Work Note (Addendum)
CSW presented bed offers to pt and pt's brother and they choose [facility]. Facility notified. Awaiting stability for d/c. Per MD, pt to have GI consult today.  Benay Pike, [place]

## 2011-10-26 NOTE — Clinical Social Work Placement (Signed)
Clinical Social Work Department CLINICAL SOCIAL WORK PLACEMENT NOTE 10/26/2011  Patient:  Christine Hancock, Christine Hancock  Account Number:  [id number] Admit date:  10/23/2011  Clinical Social Worker:  Werner Lean, LCSW  Date/time:  10/24/2011 03:20 PM  Clinical Social Work is seeking post-discharge placement for this patient at the following level of care:   SKILLED NURSING   (*CSW will update this form in Epic as items are completed)   10/24/2011  Patient/family provided with [place] Department of Clinical Social Work's list of facilities offering this level of care within the geographic area requested by the patient (or if unable, by the patient's family).  10/24/2011  Patient/family informed of their freedom to choose among providers that offer the needed level of care, that participate in Medicare, Medicaid or managed care program needed by the patient, have an available bed and are willing to accept the patient.  10/24/2011  Patient/family informed of [facility]' ownership interest in [facility], as well as of the fact that they are under no obligation to receive care at this facility.  PASARR submitted to EDS on 10/24/2011 PASARR number received from EDS on 10/24/2011  FL2 transmitted to all facilities in geographic area requested by pt/family on  10/24/2011 FL2 transmitted to all facilities within larger geographic area on   Patient informed that his/her managed care company has contracts with or will negotiate with  certain facilities, including the following:     Patient/family informed of bed offers received:  10/26/2011 Patient chooses bed at [facility] Physician recommends and patient chooses bed at  [facility]  Patient to be transferred to  on   Patient to be transferred to facility by   The following physician request were entered in Epic:   Additional Comments:  Benay Pike, [place]

## 2011-10-26 NOTE — Progress Notes (Signed)
Physical Therapy Treatment Patient Details Name: Christine Hancock MRN: [id number] DOB: 1917-06-26 Today's Date: 10/26/2011 Time: 0920-0958 PT Time Calculation (min): 38 min 1 GT/1 Therex  PT Assessment / Plan / Recommendation Comments on Treatment Session  Patient improving today with gait distance as well as general mobility. Patient completed 61' of gait training with RW;Min A;no LOB. Patient able to complete 5 sit<>stand with MIn A from low seat as well. Upon standing patient is somewhat unsteady at times due to flexed trunk;multiple verbal/tactile cueing needed to bring COG over BOS    Follow Up Recommendations       Barriers to Discharge        Equipment Recommendations       Recommendations for Other Services    Frequency     Plan      Precautions / Restrictions Restrictions Weight Bearing Restrictions: No   Pertinent Vitals/Pain     Mobility  Bed Mobility Bed Mobility: Sit to Supine Supine to Sit: 4: Min guard (verbal cues to use UE for self assist) Sitting - Scoot to Edge of Bed: 6: Modified independent (Device/Increase time) Transfers Sit to Stand: From elevated surface;4: Min assist;From bed Stand to Sit: 4: Min assist;3: Mod assist Details for Transfer Assistance: assistance with controlling descent  Ambulation/Gait Ambulation/Gait Assistance: 4: Min guard Ambulation Distance (Feet): 80 Feet Assistive device: Rolling walker    Exercises General Exercises - Upper Extremity Shoulder Horizontal ABduction: Both;10 reps Shoulder Horizontal ADduction: 10 reps;Both General Exercises - Lower Extremity Ankle Circles/Pumps: Both;15 reps Quad Sets: Both;10 reps Gluteal Sets: 10 reps Short Arc Quad: Both;10 reps Long Arc Quad: Both;10 reps Heel Slides: Both;10 reps Hip ABduction/ADduction: Both;10 reps Other Exercises Other Exercises: sit<>stand x5   PT Diagnosis:    PT Problem List:   PT Treatment Interventions:     PT Goals    Visit Information  Last PT  Received On: 10/26/11    Subjective Data      Cognition       Balance     End of Session PT - End of Session Equipment Utilized During Treatment: Gait belt Activity Tolerance: Patient tolerated treatment well Patient left: in chair;with chair alarm set;with call bell/phone within reach   GP     Javious Hallisey, [place] 10/26/2011, 10:09 AM

## 2011-10-26 NOTE — Care Management Note (Unsigned)
    Page 1 of 1   10/26/2011     8:42:40 AM   CARE MANAGEMENT NOTE 10/26/2011  Patient:  Christine Hancock, Christine Hancock   Account Number:  [id number]  Date Initiated:  10/26/2011  Documentation initiated by:  Theophilus Kinds  Subjective/Objective Assessment:   Pt admitted from home with dehydration and anemia. Pt lives with a brother but is now requiring SNF.     Action/Plan:   CSW is aware and will make discharge arrangements when medically stable and SNF bed available.   Anticipated DC Date:  10/28/2011   Anticipated DC Plan:  SKILLED NURSING FACILITY  In-house referral  Clinical Social Worker      DC Planning Services  CM consult      Choice offered to / List presented to:             Status of service:  Completed, signed off Medicare Important Message given?   (If response is "NO", the following Medicare IM given date fields will be blank) Date Medicare IM given:   Date Additional Medicare IM given:    Discharge Disposition:    Per UR Regulation:    If discussed at Long Length of Stay Meetings, dates discussed:    Comments:  10/26/11 0842 Christinia Gully, RN BSN CM

## 2011-10-26 NOTE — Consult Note (Signed)
Referring Provider: Dr Luan Pulling Primary Care Physician:  Alonza Bogus, MD Primary Gastroenterologist:  Dr. Gala Romney  Reason for Consultation:  IDA  HPI: Christine Hancock is a 78 y.o. female admitted w/ weakness, dehydration & anemia.  Hgb 8.9 yesterday & 8.2 today.  Iron is low, ferritin elevated & B12 normal.  Creatine elevated, but no hx CKD given.  C/o mid-upper abdominal pain x 3 days, intermittent, 8/10 at worst. C/o nausea but no vomiting.  Eating regularly.  Denies anorexia.  Weight stable.   Denies any lower GI symptoms including constipation, diarrhea, rectal bleeding, or melena.  She does not remember whether she has had colonoscopy, but states if so it was years ago.  She can't remember an EGD either.  Hx anemia over past 6yrs maybe longer.  No hx transfusions.  No hx sickle cell.  Brother states he has borderline anemia & believes another sister does too.  Hx thyroid issues years ago, but not taking medicine recently.  Takes ASA 81mg  daily.  Denies any other NSAIDS.  VITAMIN B12   Collection Time   10/23/11  2:45 PM      Component Value Range   Vitamin B-12 568  211 - 911 pg/mL  FOLATE   Collection Time   10/23/11  2:45 PM      Component Value Range   Folate >20.0    IRON AND TIBC   Collection Time   10/23/11  2:45 PM      Component Value Range   Iron 34 (*) 42 - 135 ug/dL   TIBC 256  250 - 470 ug/dL   Saturation Ratios 13 (*) 20 - 55 %   UIBC 222  125 - 400 ug/dL  FERRITIN   Collection Time   10/23/11  2:45 PM      Component Value Range   Ferritin 374 (*) 10 - 291 ng/mL  RETICULOCYTES   Collection Time   10/23/11  2:45 PM      Component Value Range   Retic Ct Pct 1.0  0.4 - 3.1 %   RBC. 2.93 (*) 3.87 - 5.11 MIL/uL   Retic Count, Manual 29.3  19.0 - 186.0 K/uL   Past Medical History  Diagnosis Date  . Hypotension   . Phlebitis   . GERD (gastroesophageal reflux disease)   . Hypercholesterolemia     Past Surgical History  Procedure Date  . Esophageal dilation   .  Abdominal hysterectomy     partial  . Tonsillectomy     Prior to Admission medications   Medication Sig Start Date End Date Taking? Authorizing Provider  ALPRAZolam Duanne Moron) 0.5 MG tablet Take 0.5 mg by mouth 4 (four) times daily as needed.    Yes Historical Provider, MD  aspirin EC 81 MG tablet Take 81 mg by mouth daily.   Yes Historical Provider, MD  furosemide (LASIX) 40 MG tablet Take 40 mg by mouth daily.   Yes Historical Provider, MD  gabapentin (NEURONTIN) 300 MG capsule Take 300 mg by mouth at bedtime.   Yes Historical Provider, MD  levothyroxine (SYNTHROID, LEVOTHROID) 88 MCG tablet Take 88 mcg by mouth daily.   Yes Historical Provider, MD  Multiple Vitamin (MULTIVITAMIN WITH MINERALS) TABS Take 1 tablet by mouth daily.   Yes Historical Provider, MD  omeprazole (PRILOSEC) 20 MG capsule Take 20 mg by mouth daily.   Yes Historical Provider, MD  pravastatin (PRAVACHOL) 40 MG tablet Take 80 mg by mouth daily.    Yes Historical Provider,  MD  risperiDONE (RISPERDAL) 0.5 MG tablet Take 0.5 mg by mouth 2 (two) times daily.   Yes Historical Provider, MD  rOPINIRole (REQUIP) 0.5 MG tablet Take 0.5 mg by mouth at bedtime.    Yes Historical Provider, MD  sulfamethoxazole-trimethoprim (BACTRIM DS,SEPTRA DS) 800-160 MG per tablet Take 1 tablet by mouth 2 (two) times daily. For 10 days 10/18/11  Yes Historical Provider, MD    Current Facility-Administered Medications  Medication Dose Route Frequency Provider Last Rate Last Dose  . 0.9 %  sodium chloride infusion   Intravenous Continuous Alonza Bogus, MD 75 mL/hr at 10/26/11 1120    . acetaminophen (TYLENOL) tablet 650 mg  650 mg Oral Q6H PRN Alonza Bogus, MD   650 mg at 10/26/11 0753   Or  . acetaminophen (TYLENOL) suppository 650 mg  650 mg Rectal Q6H PRN Alonza Bogus, MD      . ALPRAZolam Duanne Moron) tablet 0.5 mg  0.5 mg Oral QID PRN Alonza Bogus, MD   0.5 mg at 10/23/11 2133  . alum & mag hydroxide-simeth (MAALOX/MYLANTA)  200-200-20 MG/5ML suspension 30 mL  30 mL Oral Q6H PRN Alonza Bogus, MD      . docusate sodium (COLACE) capsule 100 mg  100 mg Oral BID Alonza Bogus, MD   100 mg at 10/26/11 0934  . enoxaparin (LOVENOX) injection 30 mg  30 mg Subcutaneous Q24H Alonza Bogus, MD   30 mg at 10/24/11 1814  . gabapentin (NEURONTIN) capsule 300 mg  300 mg Oral QHS Alonza Bogus, MD   300 mg at 10/25/11 2303  . HYDROcodone-acetaminophen (NORCO/VICODIN) 5-325 MG per tablet 1-2 tablet  1-2 tablet Oral Q4H PRN Alonza Bogus, MD      . levothyroxine (SYNTHROID, LEVOTHROID) tablet 88 mcg  88 mcg Oral Daily Alonza Bogus, MD   88 mcg at 10/26/11 0933  . magnesium hydroxide (MILK OF MAGNESIA) suspension 30 mL  30 mL Oral Daily PRN Alonza Bogus, MD   30 mL at 10/24/11 2019  . ondansetron (ZOFRAN) tablet 4 mg  4 mg Oral Q6H PRN Alonza Bogus, MD       Or  . ondansetron [facility]) injection 4 mg  4 mg Intravenous Q6H PRN Alonza Bogus, MD      . pantoprazole (PROTONIX) EC tablet 40 mg  40 mg Oral Q1200 Alonza Bogus, MD   40 mg at 10/26/11 1120  . risperiDONE (RISPERDAL) tablet 0.5 mg  0.5 mg Oral BID Alonza Bogus, MD   0.5 mg at 10/26/11 0934  . rOPINIRole (REQUIP) tablet 0.5 mg  0.5 mg Oral QHS Alonza Bogus, MD   0.5 mg at 10/25/11 2302  . simvastatin (ZOCOR) tablet 40 mg  40 mg Oral q1800 Alonza Bogus, MD   40 mg at 10/24/11 1813  . traZODone (DESYREL) tablet 25 mg  25 mg Oral QHS PRN Alonza Bogus, MD        Allergies as of 10/23/2011  . (No Known Allergies)    Family History  Problem Relation Age of Onset  . Diabetes Mother   . Diabetes Brother   . Anemia Brother   . Anemia Sister     History   Social History  . Marital Status: Widowed    Spouse Name: N/A    Number of Children: 0  . Years of Education: N/A   Occupational History  . retired Automotive engineer    Social History Main  Topics  . Smoking status: Former Smoker    Types: Cigarettes    Quit date: 10/22/2005   . Smokeless tobacco: Not on file  . Alcohol Use: No  . Drug Use: No  . Sexually Active: No   Other Topics Concern  . Not on file   Social History Narrative   Moved from [place] 27 yrs ago.  Lives w/ brother.    Review of Systems: Gen: Denies any fever, chills, sweats,  CV: Denies chest pain, angina, palpitations, syncope, orthopnea, PND, peripheral edema, and claudication. Resp: Denies dyspnea at rest, dyspnea with exercise, cough, sputum, wheezing, coughing up blood, and pleurisy. GI: Denies vomiting blood, jaundice GU : Denies urinary burning, blood in urine, urinary frequency, urinary hesitancy, nocturnal urination, and urinary incontinence. MS: Denies joint pain, limitation of movement, and swelling, stiffness, low back pain, extremity pain. Denies muscle weakness, cramps, atrophy.  Derm: Denies rash, itching, dry skin, hives, moles, warts, or unhealing ulcers.  Psych: Denies depression, anxiety, memory loss, suicidal ideation, hallucinations, paranoia, and confusion. Heme: Denies bruising, bleeding, and enlarged lymph nodes. Neuro:  Denies any headaches, dizziness, paresthesias. Endo:  Denies any problems with DM, thyroid, adrenal function.  Physical Exam: Vital signs in last 24 hours: Temp:  [98.2 F (36.8 C)-99 F (37.2 C)] 99 F (37.2 C) (09/03 0500) Pulse Rate:  [69-78] 70  (09/03 0500) Resp:  [18-20] 20  (09/03 0500) BP: (112-132)/(66-78) 122/75 mmHg (09/03 0500) SpO2:  [94 %-99 %] 94 % (09/03 0500) Weight:  [138 lb 3.2 oz (62.687 kg)] 138 lb 3.2 oz (62.687 kg) (09/03 0500) Last BM Date: 10/25/11 No LMP recorded. Patient is postmenopausal. General:   Alert,  Well-developed, elderly female who is pleasant and cooperative in NAD.  Accompanied by her brother. Head:  Normocephalic and atraumatic. Eyes:  Sclera clear, no icterus.   Conjunctiva pink. Ears:  Normal auditory acuity. Nose:  No deformity, discharge, or lesions. Mouth:  No deformity or lesions,oropharynx  pink & moist. Neck:  Supple; no masses or thyromegaly. Lungs:  Clear throughout to auscultation.   No wheezes, crackles, or rhonchi. No acute distress. Heart:  Regular rate and rhythm; no murmurs, clicks, rubs,  or gallops. Abdomen:  Normal bowel sounds.  No bruits.  Soft, non-tender and non-distended without masses, hepatosplenomegaly or hernias noted.  No guarding or rebound tenderness.   Rectal:  Dilated anorectum.  No internal/external masses noted.  Small amt med brown stool was hemoccult negative. Msk:  Symmetrical without gross deformities.  Pulses:  Normal pulses noted. Extremities:  No edema.  Neurologic:  Alert and oriented x4;  grossly normal neurologically. Skin:  Intact without significant lesions or rashes. Lymph Nodes:  No significant cervical adenopathy. Psych:  Alert and cooperative. Normal mood and affect.  Intake/Output from previous day: 09/02 0701 - 09/03 0700 In: 480 [P.O.:480] Out: -  Intake/Output this shift:    Lab Results:  Basename 10/24/11 0544 10/23/11 1445  WBC 5.4 5.1  HGB 8.2* 8.9*  HCT 25.6* 26.4*  PLT 212 240   BMET  Basename 10/24/11 0544 10/23/11 1445  NA 138 137  K 4.2 4.3  CL 107 101  CO2 24 25  GLUCOSE 77 78  BUN 30* 40*  CREATININE 1.79* 2.49*  CALCIUM 8.8 9.6   LFT  Basename 10/23/11 1445  PROT 7.1  ALBUMIN 3.7  AST 24  ALT 12  ALKPHOS 77  BILITOT 0.2*  BILIDIR --  IBILI --  LIPASE --  AMYLASE --   Impression: Christine Apo  COLEMAN Hancock is a pleasant 78 y.o. female admitted w/ weakness & found to have iron deficiency anemia.  Pt has had 3-day hx of mid-upper abdominal pain, but denies any other GI symptoms.  Denies rectal bleeding or melena.  No recent colonoscopy.  She is unsure whether she wants to pursue invasive testing such as colonoscopy & EGD at this time.  On aspirin at home.  Cannot r/o occult colorectal carcinoma or malignancy.  No gross or occult GI bleeding at this time.  Plan: 1. hemoccult stools x3 2. Agree w/ PPI  daily 3. Consider CT A/P if pt does not want to pursue colonoscopy 4. TSH   LOS: 3 days   Vickey Huger  10/26/2011, 12:08 PM [facility]   As outlined above. Patient tells me this afternoon she's not sure if she wants to have any further testing done. She wants to think about things. Will continue to follow with you.

## 2011-10-27 DIAGNOSIS — D509 Iron deficiency anemia, unspecified: Secondary | ICD-10-CM | POA: Diagnosis not present

## 2011-10-27 LAB — CBC WITH DIFFERENTIAL/PLATELET
Hemoglobin: 8.3 g/dL — ABNORMAL LOW (ref 12.0–15.0)
Lymphs Abs: 1.5 10*3/uL (ref 0.7–4.0)
Monocytes Relative: 11 % (ref 3–12)
Neutro Abs: 3.4 10*3/uL (ref 1.7–7.7)
Neutrophils Relative %: 60 % (ref 43–77)
Platelets: 198 10*3/uL (ref 150–400)
RBC: 2.84 MIL/uL — ABNORMAL LOW (ref 3.87–5.11)
WBC: 5.6 10*3/uL (ref 4.0–10.5)

## 2011-10-27 LAB — TSH: TSH: 1.536 u[IU]/mL (ref 0.350–4.500)

## 2011-10-27 LAB — OCCULT BLOOD X 1 CARD TO LAB, STOOL: Fecal Occult Bld: NEGATIVE

## 2011-10-27 MED ORDER — PEG 3350-KCL-NABCB-NACL-NASULF 236 G PO SOLR
4000.0000 mL | Freq: Once | ORAL | Status: AC
  Administered 2011-10-27: 4000 mL via ORAL
  Filled 2011-10-27: qty 4000

## 2011-10-27 NOTE — Progress Notes (Signed)
Physical Therapy Treatment Patient Details Name: Christine Hancock MRN: [id number] DOB: 04/05/1917 Today's Date: 10/27/2011 Time: [id number] PT Time Calculation (min): 24 min  PT Assessment / Plan / Recommendation Comments on Treatment Session  Pt increase gait distance with cueing for posture and to increase stride length.  Patient completed 100' of gait training with RW, min assistance,  1 LOB episode while turning around, able to regain balance with min guard.     Follow Up Recommendations       Barriers to Discharge        Equipment Recommendations       Recommendations for Other Services    Frequency     Plan      Precautions / Restrictions Precautions Precautions: Fall Restrictions Weight Bearing Restrictions: No       Mobility  Transfers Sit to Stand: With upper extremity assist;4: Min assist;From chair/3-in-1 Stand to Sit: 4: Min assist;To chair/3-in-1 Ambulation/Gait Ambulation/Gait Assistance: 4: Min guard Ambulation Distance (Feet): 100 Feet Assistive device: Rolling walker Gait Pattern: Step-through pattern;Decreased step length - right;Decreased step length - left;Trunk flexed Gait velocity: very slow initially with shorter steps, able to speed up to WNL following cueing Stairs: No Wheelchair Mobility Wheelchair Mobility: No    Exercises Total Joint Exercises Ankle Circles/Pumps: AROM;Both;10 reps;Seated Hip ABduction/ADduction: AROM;Strengthening;Both;10 reps;Seated Long Arc Quad: AROM;Strengthening;Both;10 reps;Seated   PT Diagnosis:    PT Problem List:   PT Treatment Interventions:     PT Goals Acute Rehab PT Goals PT Goal: Sit to Stand - Progress: Met PT Goal: Stand to Sit - Progress: Progressing toward goal (controlled descent, min A) PT Goal: Ambulate - Progress: Progressing toward goal  Visit Information  Last PT Received On: 10/27/11    Subjective Data  Subjective: Pt c/o gas this morning and some pain on upper stomach,   Cognition  Overall Cognitive Status: Appears within functional limits for tasks assessed/performed Arousal/Alertness: Awake/alert Orientation Level: Appears intact for tasks assessed Behavior During Session: [facility] for tasks performed    Balance     End of Session PT - End of Session Equipment Utilized During Treatment: Gait belt Activity Tolerance: Patient tolerated treatment well Patient left: in chair;with call bell/phone within reach;with chair alarm set Nurse Communication: Mobility status   GP     Aldona Lento 10/27/2011, 10:34 AM

## 2011-10-27 NOTE — Progress Notes (Signed)
Subjective: She says she feels okay. I talked to her about having the evaluation for anemia and she now says she will go through with testing. She has no other new complaints  Objective: Vital signs in last 24 hours: Temp:  [98.1 F (36.7 C)-98.9 F (37.2 C)] 98.1 F (36.7 C) (09/04 0500) Pulse Rate:  [65-72] 72  (09/04 0500) Resp:  [18-20] 18  (09/04 0500) BP: (123-136)/(72-85) 136/85 mmHg (09/04 0500) SpO2:  [96 %-97 %] 96 % (09/04 0500) Weight:  [65 kg (143 lb 4.8 oz)] 65 kg (143 lb 4.8 oz) (09/04 0500) Weight change: 2.313 kg (5 lb 1.6 oz) Last BM Date: 10/25/11  Intake/Output from previous day: 09/03 0701 - 09/04 0700 In: 3525 [I.V.:3525] Out: -   PHYSICAL EXAM General appearance: alert and no distress Resp: clear to auscultation bilaterally Cardio: regular rate and rhythm, S1, S2 normal, no murmur, click, rub or gallop GI: soft, non-tender; bowel sounds normal; no masses,  no organomegaly Extremities: extremities normal, atraumatic, no cyanosis or edema  Lab Results:    Basic Metabolic Panel: No results found for this basename: NA:2,K:2,CL:2,CO2:2,GLUCOSE:2,BUN:2,CREATININE:2,CALCIUM:2,MG:2,PHOS:2 in the last 72 hours Liver Function Tests: No results found for this basename: AST:2,ALT:2,ALKPHOS:2,BILITOT:2,PROT:2,ALBUMIN:2 in the last 72 hours No results found for this basename: LIPASE:2,AMYLASE:2 in the last 72 hours No results found for this basename: AMMONIA:2 in the last 72 hours CBC: No results found for this basename: WBC:2,NEUTROABS:2,HGB:2,HCT:2,MCV:2,PLT:2 in the last 72 hours Cardiac Enzymes: No results found for this basename: CKTOTAL:3,CKMB:3,CKMBINDEX:3,TROPONINI:3 in the last 72 hours BNP: No results found for this basename: PROBNP:3 in the last 72 hours D-Dimer: No results found for this basename: DDIMER:2 in the last 72 hours CBG: No results found for this basename: GLUCAP:6 in the last 72 hours Hemoglobin A1C: No results found for this  basename: HGBA1C in the last 72 hours Fasting Lipid Panel: No results found for this basename: CHOL,HDL,LDLCALC,TRIG,CHOLHDL,LDLDIRECT in the last 72 hours Thyroid Function Tests: No results found for this basename: TSH,T4TOTAL,FREET4,T3FREE,THYROIDAB in the last 72 hours Anemia Panel: No results found for this basename: VITAMINB12,FOLATE,FERRITIN,TIBC,IRON,RETICCTPCT in the last 72 hours Coagulation: No results found for this basename: LABPROT:2,INR:2 in the last 72 hours Urine Drug Screen: Drugs of Abuse  No results found for this basename: labopia, cocainscrnur, labbenz, amphetmu, thcu, labbarb    Alcohol Level: No results found for this basename: ETH:2 in the last 72 hours Urinalysis: No results found for this basename: COLORURINE:2,APPERANCEUR:2,LABSPEC:2,PHURINE:2,GLUCOSEU:2,HGBUR:2,BILIRUBINUR:2,KETONESUR:2,PROTEINUR:2,UROBILINOGEN:2,NITRITE:2,LEUKOCYTESUR:2 in the last 72 hours Misc. Labs:  ABGS No results found for this basename: PHART,PCO2,PO2ART,TCO2,HCO3 in the last 72 hours CULTURES No results found for this or any previous visit (from the past 240 hour(s)). Studies/Results: No results found.  Medications:  Prior to Admission:  Prescriptions prior to admission  Medication Sig Dispense Refill  . ALPRAZolam (XANAX) 0.5 MG tablet Take 0.5 mg by mouth 4 (four) times daily as needed.       Marland Kitchen aspirin EC 81 MG tablet Take 81 mg by mouth daily.      . furosemide (LASIX) 40 MG tablet Take 40 mg by mouth daily.      Marland Kitchen gabapentin (NEURONTIN) 300 MG capsule Take 300 mg by mouth at bedtime.      Marland Kitchen levothyroxine (SYNTHROID, LEVOTHROID) 88 MCG tablet Take 88 mcg by mouth daily.      . Multiple Vitamin (MULTIVITAMIN WITH MINERALS) TABS Take 1 tablet by mouth daily.      Marland Kitchen omeprazole (PRILOSEC) 20 MG capsule Take 20 mg by mouth daily.      Marland Kitchen  pravastatin (PRAVACHOL) 40 MG tablet Take 80 mg by mouth daily.       . risperiDONE (RISPERDAL) 0.5 MG tablet Take 0.5 mg by mouth 2 (two)  times daily.      Marland Kitchen rOPINIRole (REQUIP) 0.5 MG tablet Take 0.5 mg by mouth at bedtime.       . sulfamethoxazole-trimethoprim (BACTRIM DS,SEPTRA DS) 800-160 MG per tablet Take 1 tablet by mouth 2 (two) times daily. For 10 days       Scheduled:   . docusate sodium  100 mg Oral BID  . enoxaparin (LOVENOX) injection  30 mg Subcutaneous Q24H  . gabapentin  300 mg Oral QHS  . levothyroxine  88 mcg Oral Daily  . pantoprazole  40 mg Oral Q1200  . risperiDONE  0.5 mg Oral BID  . rOPINIRole  0.5 mg Oral QHS  . simvastatin  40 mg Oral q1800  . sodium chloride       Continuous:   . sodium chloride 75 mL/hr at 10/27/11 0220   [id number], acetaminophen, ALPRAZolam, alum & mag hydroxide-simeth, HYDROcodone-acetaminophen, magnesium hydroxide, ondansetron (ZOFRAN) IV, ondansetron, traZODone  Assesment: She was admitted with dehydration acute on chronic renal failure failure to thrive and iron deficiency anemia. She has decided to go ahead with testing Active Problems:  Dehydration  Acute on chronic renal failure  Failure to thrive in adult  Anemia  Anxiety  Hypothyroidism    Plan: I will discuss with gastroenterology.    LOS: 4 days   Liliane Mallis L 10/27/2011, 8:51 AM

## 2011-10-27 NOTE — Progress Notes (Signed)
Subjective: Notes upper abdominal pain after eating. +nausea. No melena, hematochezia noted. Wants to proceed with evaluation for IDA.   Objective: Vital signs in last 24 hours: Temp:  [98.1 F (36.7 C)-98.9 F (37.2 C)] 98.1 F (36.7 C) (09/04 0500) Pulse Rate:  [65-72] 72  (09/04 0500) Resp:  [18-20] 18  (09/04 0500) BP: (123-136)/(72-85) 136/85 mmHg (09/04 0500) SpO2:  [96 %-97 %] 96 % (09/04 0500) Weight:  [143 lb 4.8 oz (65 kg)] 143 lb 4.8 oz (65 kg) (09/04 0500) Last BM Date: 10/25/11 General:   Alert and oriented, pleasant Head:  Normocephalic and atraumatic. Eyes:  No icterus, sclera clear. Conjuctiva pink.  Heart:  S1, S2 present, no murmurs noted.  Lungs: Clear to auscultation bilaterally, without wheezing, rales, or rhonchi.  Abdomen:  Bowel sounds present, soft, non-tender, non-distended. No HSM or hernias noted. No rebound or guarding. No masses appreciated  Msk:  Symmetrical without gross deformities. Normal posture. Extremities:  Without clubbing or edema. Neurologic:  Alert and  oriented x4;  grossly normal neurologically. Skin:  Warm and dry, intact without significant lesions.  Psych:  Alert and cooperative. Normal mood and affect.  Intake/Output from previous day: 09/03 0701 - 09/04 0700 In: 3525 [I.V.:3525] Out: -  Intake/Output this shift:    Lab Results:  Basename 10/27/11 0449  WBC 5.6  HGB 8.3*  HCT 25.7*  PLT 198     Assessment: 78 year old female with IDA but no overt signs of GI bleeding. Heme negative. Notes postprandial abdominal pain and nausea. (gallbladder remains in situ).  Hgb stable. Would like to proceed with evaluation. Discussed in detail with pt the risks and benefits. She agrees to this. Plan for TCS/EGD tomorrow, 9/5.    Plan: TCS+/-EGD on 9/5 NPO after MN Hold Lovenox for now  SCDs   LOS: 4 days   Laban Emperor  10/27/2011, 12:12 PM

## 2011-10-28 ENCOUNTER — Encounter (HOSPITAL_COMMUNITY)
Admission: EM | Disposition: A | Discharge status: Skilled Nursing Facility | Payer: Self-pay | Source: Home / Self Care | Attending: Pulmonary Disease

## 2011-10-28 ENCOUNTER — Encounter (HOSPITAL_COMMUNITY): Payer: Self-pay | Admitting: *Deleted

## 2011-10-28 DIAGNOSIS — R933 Abnormal findings on diagnostic imaging of other parts of digestive tract: Secondary | ICD-10-CM | POA: Diagnosis not present

## 2011-10-28 DIAGNOSIS — K449 Diaphragmatic hernia without obstruction or gangrene: Secondary | ICD-10-CM | POA: Diagnosis not present

## 2011-10-28 DIAGNOSIS — K2940 Chronic atrophic gastritis without bleeding: Secondary | ICD-10-CM | POA: Diagnosis not present

## 2011-10-28 DIAGNOSIS — D509 Iron deficiency anemia, unspecified: Secondary | ICD-10-CM

## 2011-10-28 DIAGNOSIS — R109 Unspecified abdominal pain: Secondary | ICD-10-CM

## 2011-10-28 DIAGNOSIS — R1013 Epigastric pain: Secondary | ICD-10-CM | POA: Diagnosis not present

## 2011-10-28 DIAGNOSIS — K5730 Diverticulosis of large intestine without perforation or abscess without bleeding: Secondary | ICD-10-CM | POA: Diagnosis not present

## 2011-10-28 SURGERY — COLONOSCOPY, ESOPHAGOGASTRODUODENOSCOPY (EGD) AND ESOPHAGEAL DILATION (ED)
Anesthesia: Moderate Sedation

## 2011-10-28 MED ORDER — BISACODYL 5 MG PO TBEC
10.0000 mg | DELAYED_RELEASE_TABLET | Freq: Once | ORAL | Status: AC
  Administered 2011-10-28: 10 mg via ORAL
  Filled 2011-10-28: qty 2

## 2011-10-28 MED ORDER — SODIUM CHLORIDE 0.9 % IV SOLN: INTRAVENOUS | Status: DC

## 2011-10-28 MED ORDER — MIDAZOLAM HCL 5 MG/5ML IJ SOLN
INTRAMUSCULAR | Status: AC
  Filled 2011-10-28: qty 10

## 2011-10-28 MED ORDER — STERILE WATER FOR IRRIGATION IR SOLN
Status: DC | PRN
  Administered 2011-10-28: 16:00:00

## 2011-10-28 MED ORDER — MIDAZOLAM HCL 5 MG/5ML IJ SOLN
INTRAMUSCULAR | Status: DC | PRN
  Administered 2011-10-28 (×2): 1 mg via INTRAVENOUS

## 2011-10-28 MED ORDER — MEPERIDINE HCL 100 MG/ML IJ SOLN
INTRAMUSCULAR | Status: DC | PRN
  Administered 2011-10-28: 25 mg via INTRAVENOUS

## 2011-10-28 MED ORDER — MEPERIDINE HCL 100 MG/ML IJ SOLN
INTRAMUSCULAR | Status: AC
  Filled 2011-10-28: qty 2

## 2011-10-28 MED ORDER — BUTAMBEN-TETRACAINE-BENZOCAINE 2-2-14 % EX AERO
INHALATION_SPRAY | CUTANEOUS | Status: DC | PRN
  Administered 2011-10-28: 2 via TOPICAL

## 2011-10-28 NOTE — Op Note (Signed)
[facility] [address] [place], [address]   ENDOSCOPY PROCEDURE REPORT  PATIENT: Christine Hancock, Christine Hancock  MR#: [id number] BIRTHDATE: [birth year] , 89  yrs. old GENDER: Female ENDOSCOPIST: R.  Garfield Cornea, MD FACP [facility] REFERRED BY:  hospitalist PROCEDURE DATE:  10/28/2011 PROCEDURE:    EGD with gastric biopsy   INDICATIONS:   vague epigastric pain with mixed iron deficiency anemia and anemia of chronic disease   INFORMED CONSENT:   The risks, benefits, limitations, alternatives and imponderables have been discussed.  The potential for biopsy, esophogeal dilation, etc. have also been reviewed.  Questions have been answered.  All parties agreeable.  Please see the history and physical in the medical record for more information.  MEDICATIONS:  Versed 1 mg IV and Demerol 25 mg IV in a single dose. Cetacaine spray  DESCRIPTION OF PROCEDURE:   The Pentax Gastroscope [id number] endoscope was introduced through the mouth and advanced to the second portion of the duodenum without difficulty or limitations. The mucosal surfaces were surveyed very carefully during advancement of the scope and upon withdrawal.  Retroflexion view of the proximal stomach and esophagogastric junction was performed.      FINDINGS: noncritical Schatzki's ring; otherwise normal esophagus. Stomach empty. Small hiatal hernia. Scattered superficial mottling erythema and tiny erosions. No ulcer or infiltrating process. Pylorus patulous. Normal appearing duodenal bulb and second portion  THERAPEUTIC / DIAGNOSTIC MANEUVERS PERFORMED:  Biopsies of the gastric antrum and body were taken for histologic study   COMPLICATIONS:  None  IMPRESSION:  Noncritical Schatzki's ring-not manipulated. Small hiatal hernia. Abnormal gastric mucosa of doubtful clinical significance-status post biopsy to rule out H. pylori.  RECOMMENDATIONS:  Followup on pathology. See colonoscopy  report.    _______________________________ R. Garfield Cornea, MD FACP [facility] eSigned:  R. Garfield Cornea, MD FACP [facility] 10/28/2011 4:15 PM     CC:

## 2011-10-28 NOTE — Progress Notes (Signed)
10/28/11 [id number] Patient given tap water enema this morning as ordered per nurse tech. Stated still having small amounts stool present, not completely clear. Discussed with Neil Crouch, PA on rounds this morning, stated would review and order dulcolax dose as well. Dulcolax 10 mg po x 1 given as ordered. Nursing to monitor. Cindie Crumbly

## 2011-10-28 NOTE — Progress Notes (Signed)
Tap water enema given with good results, stool brown liquid with semi hard balls, no complain of discomfort

## 2011-10-28 NOTE — Progress Notes (Signed)
10/28/11 1427 Patient stated does not usually have results with dulcolax when she has taken at home. Notified Neil Crouch, PA that patient has not had any stool since receiving dulcolax this morning, stated okay. No new orders at that time. Patient awaiting colonoscopy/EGD this afternoon. Christine Hancock

## 2011-10-28 NOTE — Progress Notes (Signed)
Awaiting EGD to be completed today before d/c to SNF bed at [facility]- anticipate d/c later today or tomorrow per MD. Updated SNF- Eduard Clos, MSW, [place]

## 2011-10-28 NOTE — Op Note (Signed)
[facility] [address] [place], [address]   COLONOSCOPY PROCEDURE REPORT  PATIENT: Christine Hancock, Christine Hancock  MR#:         [id number] BIRTHDATE: 01/30/1918 , 89  yrs. old GENDER: Female ENDOSCOPIST: R.  Garfield Cornea, MD FACP [facility] REFERRED BY:  hospitalist PROCEDURE DATE:  10/28/2011 PROCEDURE:     attempted colonoscopy  INDICATIONS: iron deficiency anemia  INFORMED CONSENT:  The risks, benefits, alternatives and imponderables including but not limited to bleeding, perforation as well as the possibility of a missed lesion have been reviewed.  The potential for biopsy, lesion removal, etc. have also been discussed.  Questions have been answered.  All parties agreeable. Please see the history and physical in the medical record for more information.  MEDICATIONS: Versed 2 mg IV and Demerol 25 mg I  DESCRIPTION OF PROCEDURE:  After a digital rectal exam was performed, the [id number]  colonoscope was advanced from the anus through the rectum and colon to the area of the cecum, ileocecal valve and appendiceal orifice.  The cecum was deeply intubated.  These structures were well-seen and photographed for the record.  From the level of the cecum and ileocecal valve, the scope was slowly and cautiously withdrawn.  The mucosal surfaces were carefully surveyed utilizing scope tip deflection to facilitate fold flattening as needed.  The scope was pulled down into the rectum where a thorough examination including retroflexion was performed.    FINDINGS:  poor/inadequate prep precluded complete examination. I advanced the scope from the anus into the distal sigmoid Where I encountered formed stool occluding the lumen which precluded completion of the examination.  THERAPEUTIC / DIAGNOSTIC MANEUVERS PERFORMED:  none  COMPLICATIONS: none  CECAL WITHDRAWAL TIME:  not applicable  IMPRESSION:  Sigmoid diverticulosis. Colonoscopy precluded by  poor preparation.  RECOMMENDATIONS: pursue an air-contrast barium enema to image the upstream colon  Not seen on today's examination to rule out gross tumor. If barium contrast imaging of the more proximal colon is unremarkable for any significant lesion, I would recommend she undergo a contrast CT of abdomen and pelvis to complete her GI evaluation.   _______________________________ eSigned:  R. Garfield Cornea, MD FACP [facility] 10/28/2011 4:29 PM   CC:

## 2011-10-28 NOTE — Progress Notes (Signed)
Subjective: She ha sagreed to have GI workup. She has no other new complaints  Objective: Vital signs in last 24 hours: Temp:  [98 F (36.7 C)-98.6 F (37 C)] 98.2 F (36.8 C) (09/05 1648) Pulse Rate:  [56-81] 56  (09/05 1648) Resp:  [13-24] 16  (09/05 1648) BP: (103-153)/(42-84) 124/74 mmHg (09/05 1648) SpO2:  [90 %-98 %] 96 % (09/05 1648) Weight:  [69.899 kg (154 lb 1.6 oz)] 69.899 kg (154 lb 1.6 oz) (09/05 0512) Weight change: 4.899 kg (10 lb 12.8 oz) Last BM Date: 10/28/11  Intake/Output from previous day: 09/04 0701 - 09/05 0700 In: 1240 [P.O.:600; I.V.:640] Out: 1050 [Urine:1050]  PHYSICAL EXAM General appearance: alert, cooperative and no distress Resp: clear to auscultation bilaterally Cardio: regular rate and rhythm, S1, S2 normal, no murmur, click, rub or gallop GI: soft, non-tender; bowel sounds normal; no masses,  no organomegaly Extremities: extremities normal, atraumatic, no cyanosis or edema  Lab Results:    Basic Metabolic Panel: No results found for this basename: NA:2,K:2,CL:2,CO2:2,GLUCOSE:2,BUN:2,CREATININE:2,CALCIUM:2,MG:2,PHOS:2 in the last 72 hours Liver Function Tests: No results found for this basename: AST:2,ALT:2,ALKPHOS:2,BILITOT:2,PROT:2,ALBUMIN:2 in the last 72 hours No results found for this basename: LIPASE:2,AMYLASE:2 in the last 72 hours No results found for this basename: AMMONIA:2 in the last 72 hours CBC:  Basename 10/27/11 0449  WBC 5.6  NEUTROABS 3.4  HGB 8.3*  HCT 25.7*  MCV 90.5  PLT 198   Cardiac Enzymes: No results found for this basename: CKTOTAL:3,CKMB:3,CKMBINDEX:3,TROPONINI:3 in the last 72 hours BNP: No results found for this basename: PROBNP:3 in the last 72 hours D-Dimer: No results found for this basename: DDIMER:2 in the last 72 hours CBG: No results found for this basename: GLUCAP:6 in the last 72 hours Hemoglobin A1C: No results found for this basename: HGBA1C in the last 72 hours Fasting Lipid Panel: No  results found for this basename: CHOL,HDL,LDLCALC,TRIG,CHOLHDL,LDLDIRECT in the last 72 hours Thyroid Function Tests:  Basename 10/27/11 0449  TSH 1.536  T4TOTAL --  Delcie Roch --  T3FREE --  THYROIDAB --   Anemia Panel: No results found for this basename: VITAMINB12,FOLATE,FERRITIN,TIBC,IRON,RETICCTPCT in the last 72 hours Coagulation: No results found for this basename: LABPROT:2,INR:2 in the last 72 hours Urine Drug Screen: Drugs of Abuse  No results found for this basename: labopia, cocainscrnur, labbenz, amphetmu, thcu, labbarb    Alcohol Level: No results found for this basename: ETH:2 in the last 72 hours Urinalysis: No results found for this basename: COLORURINE:2,APPERANCEUR:2,LABSPEC:2,PHURINE:2,GLUCOSEU:2,HGBUR:2,BILIRUBINUR:2,KETONESUR:2,PROTEINUR:2,UROBILINOGEN:2,NITRITE:2,LEUKOCYTESUR:2 in the last 72 hours Misc. Labs:  ABGS No results found for this basename: PHART,PCO2,PO2ART,TCO2,HCO3 in the last 72 hours CULTURES No results found for this or any previous visit (from the past 240 hour(s)). Studies/Results: No results found.  Medications:  Prior to Admission:  Prescriptions prior to admission  Medication Sig Dispense Refill  . ALPRAZolam (XANAX) 0.5 MG tablet Take 0.5 mg by mouth 4 (four) times daily as needed.       Marland Kitchen aspirin EC 81 MG tablet Take 81 mg by mouth daily.      . furosemide (LASIX) 40 MG tablet Take 40 mg by mouth daily.      Marland Kitchen gabapentin (NEURONTIN) 300 MG capsule Take 300 mg by mouth at bedtime.      Marland Kitchen levothyroxine (SYNTHROID, LEVOTHROID) 88 MCG tablet Take 88 mcg by mouth daily.      . Multiple Vitamin (MULTIVITAMIN WITH MINERALS) TABS Take 1 tablet by mouth daily.      Marland Kitchen omeprazole (PRILOSEC) 20 MG capsule Take 20 mg  by mouth daily.      . pravastatin (PRAVACHOL) 40 MG tablet Take 80 mg by mouth daily.       . risperiDONE (RISPERDAL) 0.5 MG tablet Take 0.5 mg by mouth 2 (two) times daily.      Marland Kitchen rOPINIRole (REQUIP) 0.5 MG tablet Take 0.5 mg by  mouth at bedtime.       . sulfamethoxazole-trimethoprim (BACTRIM DS,SEPTRA DS) 800-160 MG per tablet Take 1 tablet by mouth 2 (two) times daily. For 10 days       Scheduled:   . bisacodyl  10 mg Oral Once  . docusate sodium  100 mg Oral BID  . gabapentin  300 mg Oral QHS  . levothyroxine  88 mcg Oral Daily  . meperidine      . midazolam      . pantoprazole  40 mg Oral Q1200  . risperiDONE  0.5 mg Oral BID  . rOPINIRole  0.5 mg Oral QHS  . simvastatin  40 mg Oral q1800   Continuous:   . sodium chloride 75 mL/hr at 10/27/11 1222  . DISCONTD: sodium chloride     [id number], acetaminophen, ALPRAZolam, alum & mag hydroxide-simeth, HYDROcodone-acetaminophen, magnesium hydroxide, ondansetron (ZOFRAN) IV, ondansetron, traZODone, DISCONTD: butamben-tetracaine-benzocaine, DISCONTD: meperidine, DISCONTD: midazolam, DISCONTD: simethicone susp in sterile water 1000 mL irrigation  Assesment:she is being worked up for anemia. Her other problems are stable. Once her anemia workup is finished she can be transferred to the skill Active Problems:  Dehydration  Acute on chronic renal failure  Failure to thrive in adult  Anemia  Anxiety  Hypothyroidism    Plan:no change    LOS: 5 days   Omega Slager L 10/28/2011, 8:50 PM

## 2011-10-28 NOTE — Progress Notes (Signed)
Patient consumed entire bottle of Golytely without  difficulty. Patient made aware of NPO status now

## 2011-10-28 NOTE — Progress Notes (Signed)
Unable to work with pt today.  Is being prepped for GI procedure.

## 2011-10-29 ENCOUNTER — Inpatient Hospital Stay (HOSPITAL_COMMUNITY): Payer: Medicare Other

## 2011-10-29 DIAGNOSIS — E869 Volume depletion, unspecified: Secondary | ICD-10-CM | POA: Diagnosis not present

## 2011-10-29 DIAGNOSIS — I959 Hypotension, unspecified: Secondary | ICD-10-CM | POA: Diagnosis not present

## 2011-10-29 DIAGNOSIS — R627 Adult failure to thrive: Secondary | ICD-10-CM | POA: Diagnosis not present

## 2011-10-29 DIAGNOSIS — N19 Unspecified kidney failure: Secondary | ICD-10-CM | POA: Diagnosis not present

## 2011-10-29 NOTE — Progress Notes (Signed)
Subjective:  Patient denies abdominal pain. No n/v. Eating clear liquid breakfast. For ACBE today.   Objective: Vital signs in last 24 hours: Temp:  [98 F (36.7 C)-98.6 F (37 C)] 98.2 F (36.8 C) (09/06 0602) Pulse Rate:  [53-81] 60  (09/06 0602) Resp:  [13-24] 16  (09/06 0602) BP: (103-151)/(42-80) 133/79 mmHg (09/06 0602) SpO2:  [90 %-98 %] 97 % (09/06 0602) Weight:  [167 lb (75.751 kg)] 167 lb (75.751 kg) (09/06 0602) Last BM Date: 10/28/11 General:   Alert,  Well-developed, well-nourished, pleasant and cooperative in NAD Head:  Normocephalic and atraumatic. Eyes:  Sclera clear, no icterus.   Abdomen:  Soft, nontender and nondistended.  Extremities:  Without clubbing, deformity or edema. Neurologic:  Alert and  oriented x4;  grossly normal neurologically. Skin:  Intact without significant lesions or rashes. Psych:  Alert and cooperative. Normal mood and affect.  Intake/Output from previous day: 09/05 0701 - 09/06 0700 In: 765 [P.O.:240; I.V.:525] Out: 1050 [Urine:1050] Intake/Output this shift:    Lab Results: CBC  Basename 10/27/11 0449  WBC 5.6  HGB 8.3*  HCT 25.7*  MCV 90.5  PLT 198    Assessment: 78 y/o with IDA but no overt GI bleeding. Heme negative. Some pp abdominal pain and nausea. EGD yesterday showed noncritical Schatzki's ring (not manipulated). Abnormal gastric mucosa of doubtful clinical significance s/p bx.  Incomplete colonoscopy due to poor bowel prep. Exam to sigmoid colon only, diverticulosis. Hemoglobin stable.  Plan: 1. F/U biopsies as available. 2. F/U Air Contrast Barium Enema results as available.  3. If ACBE of more proximal colon unremarkable, consider CT A/P with contrast to complete GI evaluation.   LOS: 6 days   Neil Crouch  10/29/2011, 7:34 AM

## 2011-10-29 NOTE — Progress Notes (Signed)
Physical Therapy Treatment Patient Details Name: Christine Hancock MRN: [id number] DOB: Sep 16, 1917 Today's Date: 10/29/2011 Time: [id number] PT Time Calculation (min): 33 min  PT Assessment / Plan / Recommendation Comments on Treatment Session  Pt very cooperative, slowly improving    Follow Up Recommendations       Barriers to Discharge        Equipment Recommendations       Recommendations for Other Services    Frequency     Plan Discharge plan remains appropriate;Frequency remains appropriate    Precautions / Restrictions     Pertinent Vitals/Pain     Mobility  Transfers Sit to Stand: 3: Mod assist;From chair/3-in-1;With armrests;With upper extremity assist (chair sits very low to the ground) Stand to Sit: 4: Min assist Ambulation/Gait Ambulation/Gait Assistance: 4: Min guard Ambulation Distance (Feet): 80 Feet Assistive device: Rolling walker Gait Pattern: Trunk flexed;Shuffle Stairs: No Wheelchair Mobility Wheelchair Mobility: No    Exercises General Exercises - Lower Extremity Ankle Circles/Pumps: AROM;Strengthening;Both;10 reps;Seated Quad Sets: AROM;Both;10 reps;Seated Gluteal Sets: AROM;Both;10 reps;Seated Short Arc Quad: AROM;Both;10 reps;Seated Long Arc Quad: AROM;Both;10 reps;Seated Heel Slides: AROM;Both;10 reps;Seated Hip ABduction/ADduction: Strengthening;Both;10 reps;Seated   PT Diagnosis:    PT Problem List:   PT Treatment Interventions:     PT Goals Acute Rehab PT Goals PT Goal: Sit to Stand - Progress: Progressing toward goal PT Goal: Stand to Sit - Progress: Progressing toward goal PT Goal: Ambulate - Progress: Progressing toward goal  Visit Information  Last PT Received On: 10/29/11    Subjective Data  Subjective: no c/o   Cognition       Balance     End of Session PT - End of Session Equipment Utilized During Treatment: Gait belt Activity Tolerance: Patient tolerated treatment well Patient left:  (pt onto stretcher for GI  procedure)   GP     Demetrios Isaacs L 10/29/2011, 10:14 AM

## 2011-10-29 NOTE — Progress Notes (Signed)
Updated SNF that patient having procedure today and possible d/c back tomorrow- they are ok with weekend admission. I will update bedside RN today to pass on to Saturday bedside RN instructions for d/c tomorrow.  Eduard Clos, MSW, [place]

## 2011-10-29 NOTE — Progress Notes (Signed)
Subjective: She says feels better. She has no new complaints  Objective: Vital signs in last 24 hours: Temp:  [97.9 F (36.6 C)-98.6 F (37 C)] 98 F (36.7 C) (09/06 1319) Pulse Rate:  [53-68] 65  (09/06 1319) Resp:  [16-26] 20  (09/06 1319) BP: (113-142)/(66-79) 133/73 mmHg (09/06 1319) SpO2:  [96 %-99 %] 99 % (09/06 1319) Weight:  [75.751 kg (167 lb)] 75.751 kg (167 lb) (09/06 0602) Weight change: 5.851 kg (12 lb 14.4 oz) Last BM Date: 10/29/11  Intake/Output from previous day: 09/05 0701 - 09/06 0700 In: 765 [P.O.:240; I.V.:525] Out: 1050 [Urine:1050]  PHYSICAL EXAM General appearance: alert, cooperative and no distress Resp: no retractions Cardio: regular rate and rhythm, S1, S2 normal, no murmur, click, rub or gallop GI: soft, non-tender; bowel sounds normal; no masses,  no organomegaly Extremities: extremities normal, atraumatic, no cyanosis or edema  Lab Results:    Basic Metabolic Panel: No results found for this basename: NA:2,K:2,CL:2,CO2:2,GLUCOSE:2,BUN:2,CREATININE:2,CALCIUM:2,MG:2,PHOS:2 in the last 72 hours Liver Function Tests: No results found for this basename: AST:2,ALT:2,ALKPHOS:2,BILITOT:2,PROT:2,ALBUMIN:2 in the last 72 hours No results found for this basename: LIPASE:2,AMYLASE:2 in the last 72 hours No results found for this basename: AMMONIA:2 in the last 72 hours CBC:  Basename 10/27/11 0449  WBC 5.6  NEUTROABS 3.4  HGB 8.3*  HCT 25.7*  MCV 90.5  PLT 198   Cardiac Enzymes: No results found for this basename: CKTOTAL:3,CKMB:3,CKMBINDEX:3,TROPONINI:3 in the last 72 hours BNP: No results found for this basename: PROBNP:3 in the last 72 hours D-Dimer: No results found for this basename: DDIMER:2 in the last 72 hours CBG: No results found for this basename: GLUCAP:6 in the last 72 hours Hemoglobin A1C: No results found for this basename: HGBA1C in the last 72 hours Fasting Lipid Panel: No results found for this basename:  CHOL,HDL,LDLCALC,TRIG,CHOLHDL,LDLDIRECT in the last 72 hours Thyroid Function Tests:  Basename 10/27/11 0449  TSH 1.536  T4TOTAL --  Delcie Roch --  T3FREE --  THYROIDAB --   Anemia Panel: No results found for this basename: VITAMINB12,FOLATE,FERRITIN,TIBC,IRON,RETICCTPCT in the last 72 hours Coagulation: No results found for this basename: LABPROT:2,INR:2 in the last 72 hours Urine Drug Screen: Drugs of Abuse  No results found for this basename: labopia, cocainscrnur, labbenz, amphetmu, thcu, labbarb    Alcohol Level: No results found for this basename: ETH:2 in the last 72 hours Urinalysis: No results found for this basename: COLORURINE:2,APPERANCEUR:2,LABSPEC:2,PHURINE:2,GLUCOSEU:2,HGBUR:2,BILIRUBINUR:2,KETONESUR:2,PROTEINUR:2,UROBILINOGEN:2,NITRITE:2,LEUKOCYTESUR:2 in the last 72 hours Misc. Labs:  ABGS No results found for this basename: PHART,PCO2,PO2ART,TCO2,HCO3 in the last 72 hours CULTURES No results found for this or any previous visit (from the past 240 hour(s)). Studies/Results: Dg Colon W/cm - Wo/w Kub  10/29/2011  *RADIOLOGY REPORT*  Clinical Data: Incomplete colonoscopy yesterday.  Only able to evaluate to the level of the sigmoid secondary to poor bowel prep. Iron deficiency anemia.  BE WITH CONTRAST - WITHOUT AND WITH KUB  Technique: Routine single contrast enema was performed.  Comparison:  Plain films of 03/01/2010.  Findings: Pre procedure scout film demonstrates a nonobstructive bowel gas pattern.  Cholecystectomy clips.  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  Single contrast evaluation of the colon.  Mild degradation secondary patient age and resultant immobility.  Extensive diverticulosis, most apparent in the sigmoid and descending colon.  An area of descending colonic spasm on image/series 16 is not persistent.  Opacification of the entire colon, including the cecum.  Minimal appendiceal or terminal ileal opacification.  No evidence of  dominant/circumferential mass.  IMPRESSION:  1.  No evidence of dominant or circumferential mass. 2.  Extensive colonic diverticulosis. 3.  Expected limitations secondary to patient age/immobility and resultant single contrast technique.   Original Report Authenticated By: Areta Haber, M.D.     Medications:  Prior to Admission:  Prescriptions prior to admission  Medication Sig Dispense Refill  . ALPRAZolam (XANAX) 0.5 MG tablet Take 0.5 mg by mouth 4 (four) times daily as needed.       Marland Kitchen aspirin EC 81 MG tablet Take 81 mg by mouth daily.      . furosemide (LASIX) 40 MG tablet Take 40 mg by mouth daily.      Marland Kitchen gabapentin (NEURONTIN) 300 MG capsule Take 300 mg by mouth at bedtime.      Marland Kitchen levothyroxine (SYNTHROID, LEVOTHROID) 88 MCG tablet Take 88 mcg by mouth daily.      . Multiple Vitamin (MULTIVITAMIN WITH MINERALS) TABS Take 1 tablet by mouth daily.      Marland Kitchen omeprazole (PRILOSEC) 20 MG capsule Take 20 mg by mouth daily.      . pravastatin (PRAVACHOL) 40 MG tablet Take 80 mg by mouth daily.       . risperiDONE (RISPERDAL) 0.5 MG tablet Take 0.5 mg by mouth 2 (two) times daily.      Marland Kitchen rOPINIRole (REQUIP) 0.5 MG tablet Take 0.5 mg by mouth at bedtime.       . sulfamethoxazole-trimethoprim (BACTRIM DS,SEPTRA DS) 800-160 MG per tablet Take 1 tablet by mouth 2 (two) times daily. For 10 days       Scheduled:   . docusate sodium  100 mg Oral BID  . gabapentin  300 mg Oral QHS  . levothyroxine  88 mcg Oral Daily  . meperidine      . midazolam      . pantoprazole  40 mg Oral Q1200  . risperiDONE  0.5 mg Oral BID  . rOPINIRole  0.5 mg Oral QHS  . simvastatin  40 mg Oral q1800   Continuous:   . sodium chloride 75 mL/hr at 10/29/11 0330  . DISCONTD: sodium chloride     [id number], acetaminophen, ALPRAZolam, alum & mag hydroxide-simeth, HYDROcodone-acetaminophen, magnesium hydroxide, ondansetron (ZOFRAN) IV, ondansetron, traZODone  Assesment:she was admitted with a acute on chronic  renal failure failure to thrive anemia and dehydration. She is better but he is having her workup completed Active Problems:  Dehydration  Acute on chronic renal failure  Failure to thrive in adult  Anemia  Anxiety  Hypothyroidism    Plan:4 barium enem today then probable discharge to SNF depending on results    LOS: 6 days   Christine Hancock L 10/29/2011, 4:35 PM

## 2011-10-29 NOTE — Progress Notes (Signed)
REVIEWED.  BE NO MASS OR STRICTURE. ADVANCE DIET

## 2011-10-30 DIAGNOSIS — M79609 Pain in unspecified limb: Secondary | ICD-10-CM | POA: Diagnosis not present

## 2011-10-30 DIAGNOSIS — L600 Ingrowing nail: Secondary | ICD-10-CM | POA: Diagnosis not present

## 2011-10-30 DIAGNOSIS — F29 Unspecified psychosis not due to a substance or known physiological condition: Secondary | ICD-10-CM | POA: Diagnosis not present

## 2011-10-30 DIAGNOSIS — E782 Mixed hyperlipidemia: Secondary | ICD-10-CM | POA: Diagnosis not present

## 2011-10-30 DIAGNOSIS — R627 Adult failure to thrive: Secondary | ICD-10-CM | POA: Diagnosis not present

## 2011-10-30 DIAGNOSIS — K219 Gastro-esophageal reflux disease without esophagitis: Secondary | ICD-10-CM | POA: Diagnosis not present

## 2011-10-30 DIAGNOSIS — E869 Volume depletion, unspecified: Secondary | ICD-10-CM | POA: Diagnosis not present

## 2011-10-30 DIAGNOSIS — K922 Gastrointestinal hemorrhage, unspecified: Secondary | ICD-10-CM | POA: Diagnosis not present

## 2011-10-30 DIAGNOSIS — E039 Hypothyroidism, unspecified: Secondary | ICD-10-CM | POA: Diagnosis not present

## 2011-10-30 DIAGNOSIS — I739 Peripheral vascular disease, unspecified: Secondary | ICD-10-CM | POA: Diagnosis not present

## 2011-10-30 DIAGNOSIS — M2550 Pain in unspecified joint: Secondary | ICD-10-CM | POA: Diagnosis not present

## 2011-10-30 DIAGNOSIS — M6281 Muscle weakness (generalized): Secondary | ICD-10-CM | POA: Diagnosis not present

## 2011-10-30 DIAGNOSIS — E860 Dehydration: Secondary | ICD-10-CM | POA: Diagnosis not present

## 2011-10-30 DIAGNOSIS — D649 Anemia, unspecified: Secondary | ICD-10-CM | POA: Diagnosis not present

## 2011-10-30 DIAGNOSIS — N179 Acute kidney failure, unspecified: Secondary | ICD-10-CM | POA: Diagnosis not present

## 2011-10-30 DIAGNOSIS — N19 Unspecified kidney failure: Secondary | ICD-10-CM | POA: Diagnosis not present

## 2011-10-30 DIAGNOSIS — I959 Hypotension, unspecified: Secondary | ICD-10-CM | POA: Diagnosis not present

## 2011-10-30 DIAGNOSIS — N039 Chronic nephritic syndrome with unspecified morphologic changes: Secondary | ICD-10-CM | POA: Diagnosis not present

## 2011-10-30 NOTE — Progress Notes (Signed)
She says she feels much better. She has no new complaints. Her barium enema yesterday did not show any significant abnormalities she is ready for transfer to the skilled care facility

## 2011-10-30 NOTE — Discharge Summary (Signed)
Physician Discharge Summary  Patient ID: Christine Hancock MRN: [id number] DOB/AGE: 10/23/1917 78 y.o. Primary Care Physician:Tally Mckinnon L, MD Admit date: 10/23/2011 Discharge date: 10/30/2011    Discharge Diagnoses:   Active Problems:  Dehydration  Acute on chronic renal failure  Failure to thrive in adult  Anemia  Anxiety  Hypothyroidism  hyperlipidemia GERD  Medication List  As of 10/30/2011 10:28 AM   STOP taking these medications         ALPRAZolam 0.5 MG tablet      aspirin EC 81 MG tablet      furosemide 40 MG tablet      gabapentin 300 MG capsule      levothyroxine 88 MCG tablet      multivitamin with minerals Tabs      omeprazole 20 MG capsule      pravastatin 40 MG tablet      risperiDONE 0.5 MG tablet      rOPINIRole 0.5 MG tablet      sulfamethoxazole-trimethoprim 800-160 MG per tablet            Discharged Condition: Improved    Consults: Gastroenterology  Significant Diagnostic Studies: Ct Head Wo Contrast  10/23/2011  *RADIOLOGY REPORT*  Clinical Data: Fatigue and weakness.  CT HEAD WITHOUT CONTRAST  Technique:  Contiguous axial images were obtained from the base of the skull through the vertex without contrast.  Comparison: None.  Findings: Mild generalized atrophy is likely within normal limits for age.  No acute cortical infarct, hemorrhage, or mass lesion is present.  The ventricles are proportionate to the degree of atrophy.  The paranasal sinuses and mastoid air cells are clear.  The osseous skull is intact.  The mild atherosclerotic calcifications are present in the cavernous carotid arteries bilaterally.  IMPRESSION: Normal CT of the head for age.   Original Report Authenticated By: Resa Miner. MATTERN, M.D.    Dg Chest Port 1 View  10/23/2011  *RADIOLOGY REPORT*  Clinical Data: Weakness  PORTABLE CHEST - 1 VIEW  Comparison: Portable exam 1545 hours compared to 09/30/2011  Findings: Rotated to the left. Normal heart size, mediastinal  contours and pulmonary vascularity. Atherosclerotic calcifications aorta. Accentuation of right basilar markings appears unchanged, suspect related to rotation, unchanged when accounting for differences in technique. Underlying emphysematous changes. No acute infiltrate, pleural effusion or pneumothorax. Bones appear diffusely demineralized.  IMPRESSION: Emphysematous changes. No acute abnormalities.   Original Report Authenticated By: Burnetta Sabin, M.D.    Dg Chest Portable 1 View  09/30/2011  *RADIOLOGY REPORT*  Clinical Data: Hypotension, bilateral leg pain  PORTABLE CHEST - 1 VIEW  Comparison: Report of remote prior non digitized exam 07/22/2000  Findings: Marked hyperinflation of the lungs is compatible with previously seen diagnosis of COPD.  The patient is rotated to the left.  Heart size upper limits of normal.  No pleural effusion.  No focal pulmonary opacity.  Minimal biapical pleural thickening.  IMPRESSION: Emphysematous changes without acute focal abnormality.  Original Report Authenticated By: Arline Asp, M.D.   Dg Colon W/cm - Wo/w Kub  10/29/2011  *RADIOLOGY REPORT*  Clinical Data: Incomplete colonoscopy yesterday.  Only able to evaluate to the level of the sigmoid secondary to poor bowel prep. Iron deficiency anemia.  BE WITH CONTRAST - WITHOUT AND WITH KUB  Technique: Routine single contrast enema was performed.  Comparison:  Plain films of 03/01/2010.  Findings: Pre procedure scout film demonstrates a nonobstructive bowel gas pattern.  Cholecystectomy clips.  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  Single contrast evaluation of the colon.  Mild degradation secondary patient age and resultant immobility.  Extensive diverticulosis, most apparent in the sigmoid and descending colon.  An area of descending colonic spasm on image/series 16 is not persistent.  Opacification of the entire colon, including the cecum.  Minimal appendiceal or terminal ileal opacification.  No  evidence of dominant/circumferential mass.  IMPRESSION:  1.  No evidence of dominant or circumferential mass. 2.  Extensive colonic diverticulosis. 3.  Expected limitations secondary to patient age/immobility and resultant single contrast technique.   Original Report Authenticated By: Areta Haber, M.D.     Lab Results: Basic Metabolic Panel: No results found for this basename: NA:2,K:2,CL:2,CO2:2,GLUCOSE:2,BUN:2,CREATININE:2,CALCIUM:2,MG:2,PHOS:2 in the last 72 hours Liver Function Tests: No results found for this basename: AST:2,ALT:2,ALKPHOS:2,BILITOT:2,PROT:2,ALBUMIN:2 in the last 72 hours   CBC: No results found for this basename: WBC:2,NEUTROABS:2,HGB:2,HCT:2,MCV:2,PLT:2 in the last 72 hours  No results found for this or any previous visit (from the past 240 hour(s)).   Hospital Course: She was admitted with dehydration acute on chronic renal failure failure to thrive and anemia. Her anemia was iron deficiency and she underwent GI workup but no definite evidence of bleeding was found. She was given IV fluids and her renal function improved. She had PT consultation it was felt that she was going to need skilled care facility placement  Discharge Exam: Blood pressure 131/75, pulse 64, temperature 98.3 F (36.8 C), temperature source Oral, resp. rate 20, height 5\' 7"  (1.702 m), weight 75.751 kg (167 lb), SpO2 97.00%. She is awake and alert. Her pupils are reactive her chest is clear. Her abdomen is soft. Her heart is regular.  Disposition: To skilled care facility. She will have PT OT and speech as needed. She will be on the following medications: Tylenol 500 mg by mouth every 4 hours when necessary pain or fever Xanax 0.5 mg by mouth 4 times a day when necessary anxiety Zofran 4 mg every 6 hours when necessary nausea Trazodone 25 mg at bedtime when necessary sleep Gabapentin 300 mg at bedtime Synthroid 88 mcg daily Omeprazole 20 mg daily Risperidone 0.5 mg twice a day Requip  0.5 mg at bedtime Simvastatin 40 mg daily   Discharge Orders    Future Orders Please Complete By Expires   Discharge to SNF when bed available      Scheduling Instructions:   Medications will be as per my discharge summary        Signed: Harvey Matlack L Pager [phone]  10/30/2011, 10:28 AM

## 2011-11-02 ENCOUNTER — Encounter: Payer: Self-pay | Admitting: *Deleted

## 2011-11-02 ENCOUNTER — Encounter: Payer: Self-pay | Admitting: Internal Medicine

## 2011-11-03 DIAGNOSIS — D649 Anemia, unspecified: Secondary | ICD-10-CM | POA: Diagnosis not present

## 2011-11-03 DIAGNOSIS — E039 Hypothyroidism, unspecified: Secondary | ICD-10-CM | POA: Diagnosis not present

## 2011-11-03 DIAGNOSIS — E860 Dehydration: Secondary | ICD-10-CM | POA: Diagnosis not present

## 2011-11-03 DIAGNOSIS — N19 Unspecified kidney failure: Secondary | ICD-10-CM | POA: Diagnosis not present

## 2011-11-16 DIAGNOSIS — I739 Peripheral vascular disease, unspecified: Secondary | ICD-10-CM | POA: Diagnosis not present

## 2011-12-07 DIAGNOSIS — L600 Ingrowing nail: Secondary | ICD-10-CM | POA: Diagnosis not present

## 2011-12-07 DIAGNOSIS — M79609 Pain in unspecified limb: Secondary | ICD-10-CM | POA: Diagnosis not present

## 2011-12-08 DIAGNOSIS — N19 Unspecified kidney failure: Secondary | ICD-10-CM | POA: Diagnosis not present

## 2011-12-08 DIAGNOSIS — E039 Hypothyroidism, unspecified: Secondary | ICD-10-CM | POA: Diagnosis not present

## 2011-12-08 DIAGNOSIS — D649 Anemia, unspecified: Secondary | ICD-10-CM | POA: Diagnosis not present

## 2011-12-08 DIAGNOSIS — E860 Dehydration: Secondary | ICD-10-CM | POA: Diagnosis not present

## 2011-12-24 DIAGNOSIS — E782 Mixed hyperlipidemia: Secondary | ICD-10-CM | POA: Diagnosis not present

## 2011-12-24 DIAGNOSIS — E039 Hypothyroidism, unspecified: Secondary | ICD-10-CM | POA: Diagnosis not present

## 2011-12-24 DIAGNOSIS — M6281 Muscle weakness (generalized): Secondary | ICD-10-CM | POA: Diagnosis not present

## 2011-12-24 DIAGNOSIS — F29 Unspecified psychosis not due to a substance or known physiological condition: Secondary | ICD-10-CM | POA: Diagnosis not present

## 2011-12-24 DIAGNOSIS — N179 Acute kidney failure, unspecified: Secondary | ICD-10-CM | POA: Diagnosis not present

## 2011-12-24 DIAGNOSIS — K219 Gastro-esophageal reflux disease without esophagitis: Secondary | ICD-10-CM | POA: Diagnosis not present

## 2011-12-24 DIAGNOSIS — D631 Anemia in chronic kidney disease: Secondary | ICD-10-CM | POA: Diagnosis not present

## 2011-12-24 DIAGNOSIS — R627 Adult failure to thrive: Secondary | ICD-10-CM | POA: Diagnosis not present

## 2011-12-24 DIAGNOSIS — K922 Gastrointestinal hemorrhage, unspecified: Secondary | ICD-10-CM | POA: Diagnosis not present

## 2011-12-30 DIAGNOSIS — I959 Hypotension, unspecified: Secondary | ICD-10-CM | POA: Diagnosis not present

## 2011-12-30 DIAGNOSIS — R269 Unspecified abnormalities of gait and mobility: Secondary | ICD-10-CM | POA: Diagnosis not present

## 2011-12-30 DIAGNOSIS — M6281 Muscle weakness (generalized): Secondary | ICD-10-CM | POA: Diagnosis not present

## 2011-12-30 DIAGNOSIS — K219 Gastro-esophageal reflux disease without esophagitis: Secondary | ICD-10-CM | POA: Diagnosis not present

## 2011-12-30 DIAGNOSIS — F29 Unspecified psychosis not due to a substance or known physiological condition: Secondary | ICD-10-CM | POA: Diagnosis not present

## 2011-12-31 DIAGNOSIS — R269 Unspecified abnormalities of gait and mobility: Secondary | ICD-10-CM | POA: Diagnosis not present

## 2011-12-31 DIAGNOSIS — M6281 Muscle weakness (generalized): Secondary | ICD-10-CM | POA: Diagnosis not present

## 2011-12-31 DIAGNOSIS — F29 Unspecified psychosis not due to a substance or known physiological condition: Secondary | ICD-10-CM | POA: Diagnosis not present

## 2011-12-31 DIAGNOSIS — K219 Gastro-esophageal reflux disease without esophagitis: Secondary | ICD-10-CM | POA: Diagnosis not present

## 2011-12-31 DIAGNOSIS — I959 Hypotension, unspecified: Secondary | ICD-10-CM | POA: Diagnosis not present

## 2012-01-04 DIAGNOSIS — M6281 Muscle weakness (generalized): Secondary | ICD-10-CM | POA: Diagnosis not present

## 2012-01-04 DIAGNOSIS — K219 Gastro-esophageal reflux disease without esophagitis: Secondary | ICD-10-CM | POA: Diagnosis not present

## 2012-01-04 DIAGNOSIS — R269 Unspecified abnormalities of gait and mobility: Secondary | ICD-10-CM | POA: Diagnosis not present

## 2012-01-04 DIAGNOSIS — I959 Hypotension, unspecified: Secondary | ICD-10-CM | POA: Diagnosis not present

## 2012-01-04 DIAGNOSIS — F29 Unspecified psychosis not due to a substance or known physiological condition: Secondary | ICD-10-CM | POA: Diagnosis not present

## 2012-01-06 DIAGNOSIS — M6281 Muscle weakness (generalized): Secondary | ICD-10-CM | POA: Diagnosis not present

## 2012-01-06 DIAGNOSIS — N19 Unspecified kidney failure: Secondary | ICD-10-CM | POA: Diagnosis not present

## 2012-01-06 DIAGNOSIS — F29 Unspecified psychosis not due to a substance or known physiological condition: Secondary | ICD-10-CM | POA: Diagnosis not present

## 2012-01-06 DIAGNOSIS — I959 Hypotension, unspecified: Secondary | ICD-10-CM | POA: Diagnosis not present

## 2012-01-06 DIAGNOSIS — R269 Unspecified abnormalities of gait and mobility: Secondary | ICD-10-CM | POA: Diagnosis not present

## 2012-01-06 DIAGNOSIS — K219 Gastro-esophageal reflux disease without esophagitis: Secondary | ICD-10-CM | POA: Diagnosis not present

## 2012-01-10 DIAGNOSIS — I959 Hypotension, unspecified: Secondary | ICD-10-CM | POA: Diagnosis not present

## 2012-01-10 DIAGNOSIS — K219 Gastro-esophageal reflux disease without esophagitis: Secondary | ICD-10-CM | POA: Diagnosis not present

## 2012-01-10 DIAGNOSIS — F29 Unspecified psychosis not due to a substance or known physiological condition: Secondary | ICD-10-CM | POA: Diagnosis not present

## 2012-01-10 DIAGNOSIS — M6281 Muscle weakness (generalized): Secondary | ICD-10-CM | POA: Diagnosis not present

## 2012-01-10 DIAGNOSIS — R269 Unspecified abnormalities of gait and mobility: Secondary | ICD-10-CM | POA: Diagnosis not present

## 2012-01-12 DIAGNOSIS — M6281 Muscle weakness (generalized): Secondary | ICD-10-CM | POA: Diagnosis not present

## 2012-01-12 DIAGNOSIS — R269 Unspecified abnormalities of gait and mobility: Secondary | ICD-10-CM | POA: Diagnosis not present

## 2012-01-12 DIAGNOSIS — F29 Unspecified psychosis not due to a substance or known physiological condition: Secondary | ICD-10-CM | POA: Diagnosis not present

## 2012-01-12 DIAGNOSIS — I959 Hypotension, unspecified: Secondary | ICD-10-CM | POA: Diagnosis not present

## 2012-01-12 DIAGNOSIS — K219 Gastro-esophageal reflux disease without esophagitis: Secondary | ICD-10-CM | POA: Diagnosis not present

## 2012-01-13 DIAGNOSIS — F29 Unspecified psychosis not due to a substance or known physiological condition: Secondary | ICD-10-CM | POA: Diagnosis not present

## 2012-01-13 DIAGNOSIS — K219 Gastro-esophageal reflux disease without esophagitis: Secondary | ICD-10-CM | POA: Diagnosis not present

## 2012-01-13 DIAGNOSIS — I959 Hypotension, unspecified: Secondary | ICD-10-CM | POA: Diagnosis not present

## 2012-01-13 DIAGNOSIS — R269 Unspecified abnormalities of gait and mobility: Secondary | ICD-10-CM | POA: Diagnosis not present

## 2012-01-13 DIAGNOSIS — M6281 Muscle weakness (generalized): Secondary | ICD-10-CM | POA: Diagnosis not present

## 2012-01-17 DIAGNOSIS — M6281 Muscle weakness (generalized): Secondary | ICD-10-CM | POA: Diagnosis not present

## 2012-01-17 DIAGNOSIS — E039 Hypothyroidism, unspecified: Secondary | ICD-10-CM | POA: Diagnosis not present

## 2012-01-17 DIAGNOSIS — K219 Gastro-esophageal reflux disease without esophagitis: Secondary | ICD-10-CM | POA: Diagnosis not present

## 2012-01-17 DIAGNOSIS — F29 Unspecified psychosis not due to a substance or known physiological condition: Secondary | ICD-10-CM | POA: Diagnosis not present

## 2012-01-17 DIAGNOSIS — I10 Essential (primary) hypertension: Secondary | ICD-10-CM | POA: Diagnosis not present

## 2012-01-17 DIAGNOSIS — I959 Hypotension, unspecified: Secondary | ICD-10-CM | POA: Diagnosis not present

## 2012-01-17 DIAGNOSIS — E785 Hyperlipidemia, unspecified: Secondary | ICD-10-CM | POA: Diagnosis not present

## 2012-01-17 DIAGNOSIS — R269 Unspecified abnormalities of gait and mobility: Secondary | ICD-10-CM | POA: Diagnosis not present

## 2012-01-17 DIAGNOSIS — F411 Generalized anxiety disorder: Secondary | ICD-10-CM | POA: Diagnosis not present

## 2012-01-18 DIAGNOSIS — R269 Unspecified abnormalities of gait and mobility: Secondary | ICD-10-CM | POA: Diagnosis not present

## 2012-01-18 DIAGNOSIS — F29 Unspecified psychosis not due to a substance or known physiological condition: Secondary | ICD-10-CM | POA: Diagnosis not present

## 2012-01-18 DIAGNOSIS — M6281 Muscle weakness (generalized): Secondary | ICD-10-CM | POA: Diagnosis not present

## 2012-01-18 DIAGNOSIS — I959 Hypotension, unspecified: Secondary | ICD-10-CM | POA: Diagnosis not present

## 2012-01-18 DIAGNOSIS — K219 Gastro-esophageal reflux disease without esophagitis: Secondary | ICD-10-CM | POA: Diagnosis not present

## 2012-01-19 DIAGNOSIS — I959 Hypotension, unspecified: Secondary | ICD-10-CM | POA: Diagnosis not present

## 2012-01-19 DIAGNOSIS — F29 Unspecified psychosis not due to a substance or known physiological condition: Secondary | ICD-10-CM | POA: Diagnosis not present

## 2012-01-19 DIAGNOSIS — K219 Gastro-esophageal reflux disease without esophagitis: Secondary | ICD-10-CM | POA: Diagnosis not present

## 2012-01-19 DIAGNOSIS — M6281 Muscle weakness (generalized): Secondary | ICD-10-CM | POA: Diagnosis not present

## 2012-01-19 DIAGNOSIS — R269 Unspecified abnormalities of gait and mobility: Secondary | ICD-10-CM | POA: Diagnosis not present

## 2012-01-27 DIAGNOSIS — F29 Unspecified psychosis not due to a substance or known physiological condition: Secondary | ICD-10-CM | POA: Diagnosis not present

## 2012-01-27 DIAGNOSIS — K219 Gastro-esophageal reflux disease without esophagitis: Secondary | ICD-10-CM | POA: Diagnosis not present

## 2012-01-27 DIAGNOSIS — M6281 Muscle weakness (generalized): Secondary | ICD-10-CM | POA: Diagnosis not present

## 2012-01-27 DIAGNOSIS — R269 Unspecified abnormalities of gait and mobility: Secondary | ICD-10-CM | POA: Diagnosis not present

## 2012-01-27 DIAGNOSIS — I959 Hypotension, unspecified: Secondary | ICD-10-CM | POA: Diagnosis not present

## 2012-01-28 DIAGNOSIS — F29 Unspecified psychosis not due to a substance or known physiological condition: Secondary | ICD-10-CM | POA: Diagnosis not present

## 2012-01-28 DIAGNOSIS — R269 Unspecified abnormalities of gait and mobility: Secondary | ICD-10-CM | POA: Diagnosis not present

## 2012-01-28 DIAGNOSIS — M6281 Muscle weakness (generalized): Secondary | ICD-10-CM | POA: Diagnosis not present

## 2012-01-28 DIAGNOSIS — K219 Gastro-esophageal reflux disease without esophagitis: Secondary | ICD-10-CM | POA: Diagnosis not present

## 2012-01-28 DIAGNOSIS — I959 Hypotension, unspecified: Secondary | ICD-10-CM | POA: Diagnosis not present

## 2012-02-02 DIAGNOSIS — M6281 Muscle weakness (generalized): Secondary | ICD-10-CM | POA: Diagnosis not present

## 2012-02-02 DIAGNOSIS — K219 Gastro-esophageal reflux disease without esophagitis: Secondary | ICD-10-CM | POA: Diagnosis not present

## 2012-02-02 DIAGNOSIS — F29 Unspecified psychosis not due to a substance or known physiological condition: Secondary | ICD-10-CM | POA: Diagnosis not present

## 2012-02-02 DIAGNOSIS — R269 Unspecified abnormalities of gait and mobility: Secondary | ICD-10-CM | POA: Diagnosis not present

## 2012-02-02 DIAGNOSIS — I959 Hypotension, unspecified: Secondary | ICD-10-CM | POA: Diagnosis not present

## 2012-02-08 DIAGNOSIS — R269 Unspecified abnormalities of gait and mobility: Secondary | ICD-10-CM | POA: Diagnosis not present

## 2012-02-08 DIAGNOSIS — K219 Gastro-esophageal reflux disease without esophagitis: Secondary | ICD-10-CM | POA: Diagnosis not present

## 2012-02-08 DIAGNOSIS — M6281 Muscle weakness (generalized): Secondary | ICD-10-CM | POA: Diagnosis not present

## 2012-02-08 DIAGNOSIS — I959 Hypotension, unspecified: Secondary | ICD-10-CM | POA: Diagnosis not present

## 2012-02-08 DIAGNOSIS — F29 Unspecified psychosis not due to a substance or known physiological condition: Secondary | ICD-10-CM | POA: Diagnosis not present

## 2012-02-15 DIAGNOSIS — R269 Unspecified abnormalities of gait and mobility: Secondary | ICD-10-CM | POA: Diagnosis not present

## 2012-02-15 DIAGNOSIS — I959 Hypotension, unspecified: Secondary | ICD-10-CM | POA: Diagnosis not present

## 2012-02-15 DIAGNOSIS — F29 Unspecified psychosis not due to a substance or known physiological condition: Secondary | ICD-10-CM | POA: Diagnosis not present

## 2012-02-15 DIAGNOSIS — M6281 Muscle weakness (generalized): Secondary | ICD-10-CM | POA: Diagnosis not present

## 2012-02-15 DIAGNOSIS — K219 Gastro-esophageal reflux disease without esophagitis: Secondary | ICD-10-CM | POA: Diagnosis not present

## 2012-03-20 DIAGNOSIS — M6281 Muscle weakness (generalized): Secondary | ICD-10-CM | POA: Diagnosis not present

## 2012-03-20 DIAGNOSIS — R609 Edema, unspecified: Secondary | ICD-10-CM | POA: Diagnosis not present

## 2012-03-20 DIAGNOSIS — I872 Venous insufficiency (chronic) (peripheral): Secondary | ICD-10-CM | POA: Diagnosis not present

## 2012-03-20 DIAGNOSIS — Z9181 History of falling: Secondary | ICD-10-CM | POA: Diagnosis not present

## 2012-03-20 DIAGNOSIS — G2581 Restless legs syndrome: Secondary | ICD-10-CM | POA: Diagnosis not present

## 2012-03-23 DIAGNOSIS — R609 Edema, unspecified: Secondary | ICD-10-CM | POA: Diagnosis not present

## 2012-03-23 DIAGNOSIS — I872 Venous insufficiency (chronic) (peripheral): Secondary | ICD-10-CM | POA: Diagnosis not present

## 2012-03-23 DIAGNOSIS — Z9181 History of falling: Secondary | ICD-10-CM | POA: Diagnosis not present

## 2012-03-23 DIAGNOSIS — M6281 Muscle weakness (generalized): Secondary | ICD-10-CM | POA: Diagnosis not present

## 2012-03-23 DIAGNOSIS — G2581 Restless legs syndrome: Secondary | ICD-10-CM | POA: Diagnosis not present

## 2012-03-24 DIAGNOSIS — M6281 Muscle weakness (generalized): Secondary | ICD-10-CM | POA: Diagnosis not present

## 2012-03-24 DIAGNOSIS — Z9181 History of falling: Secondary | ICD-10-CM | POA: Diagnosis not present

## 2012-03-24 DIAGNOSIS — G2581 Restless legs syndrome: Secondary | ICD-10-CM | POA: Diagnosis not present

## 2012-03-24 DIAGNOSIS — I872 Venous insufficiency (chronic) (peripheral): Secondary | ICD-10-CM | POA: Diagnosis not present

## 2012-03-24 DIAGNOSIS — R609 Edema, unspecified: Secondary | ICD-10-CM | POA: Diagnosis not present

## 2012-03-27 DIAGNOSIS — G2581 Restless legs syndrome: Secondary | ICD-10-CM | POA: Diagnosis not present

## 2012-03-27 DIAGNOSIS — I872 Venous insufficiency (chronic) (peripheral): Secondary | ICD-10-CM | POA: Diagnosis not present

## 2012-03-27 DIAGNOSIS — R609 Edema, unspecified: Secondary | ICD-10-CM | POA: Diagnosis not present

## 2012-03-27 DIAGNOSIS — Z9181 History of falling: Secondary | ICD-10-CM | POA: Diagnosis not present

## 2012-03-27 DIAGNOSIS — M6281 Muscle weakness (generalized): Secondary | ICD-10-CM | POA: Diagnosis not present

## 2012-03-29 DIAGNOSIS — M6281 Muscle weakness (generalized): Secondary | ICD-10-CM | POA: Diagnosis not present

## 2012-03-29 DIAGNOSIS — I872 Venous insufficiency (chronic) (peripheral): Secondary | ICD-10-CM | POA: Diagnosis not present

## 2012-03-29 DIAGNOSIS — R609 Edema, unspecified: Secondary | ICD-10-CM | POA: Diagnosis not present

## 2012-03-29 DIAGNOSIS — G2581 Restless legs syndrome: Secondary | ICD-10-CM | POA: Diagnosis not present

## 2012-03-29 DIAGNOSIS — Z9181 History of falling: Secondary | ICD-10-CM | POA: Diagnosis not present

## 2012-03-30 DIAGNOSIS — I872 Venous insufficiency (chronic) (peripheral): Secondary | ICD-10-CM | POA: Diagnosis not present

## 2012-03-30 DIAGNOSIS — R609 Edema, unspecified: Secondary | ICD-10-CM | POA: Diagnosis not present

## 2012-03-30 DIAGNOSIS — M6281 Muscle weakness (generalized): Secondary | ICD-10-CM | POA: Diagnosis not present

## 2012-03-30 DIAGNOSIS — G2581 Restless legs syndrome: Secondary | ICD-10-CM | POA: Diagnosis not present

## 2012-03-30 DIAGNOSIS — Z9181 History of falling: Secondary | ICD-10-CM | POA: Diagnosis not present

## 2012-03-31 DIAGNOSIS — G2581 Restless legs syndrome: Secondary | ICD-10-CM | POA: Diagnosis not present

## 2012-03-31 DIAGNOSIS — R609 Edema, unspecified: Secondary | ICD-10-CM | POA: Diagnosis not present

## 2012-03-31 DIAGNOSIS — Z9181 History of falling: Secondary | ICD-10-CM | POA: Diagnosis not present

## 2012-03-31 DIAGNOSIS — M6281 Muscle weakness (generalized): Secondary | ICD-10-CM | POA: Diagnosis not present

## 2012-03-31 DIAGNOSIS — I872 Venous insufficiency (chronic) (peripheral): Secondary | ICD-10-CM | POA: Diagnosis not present

## 2012-04-03 DIAGNOSIS — G2581 Restless legs syndrome: Secondary | ICD-10-CM | POA: Diagnosis not present

## 2012-04-03 DIAGNOSIS — I872 Venous insufficiency (chronic) (peripheral): Secondary | ICD-10-CM | POA: Diagnosis not present

## 2012-04-03 DIAGNOSIS — Z9181 History of falling: Secondary | ICD-10-CM | POA: Diagnosis not present

## 2012-04-03 DIAGNOSIS — R609 Edema, unspecified: Secondary | ICD-10-CM | POA: Diagnosis not present

## 2012-04-03 DIAGNOSIS — M6281 Muscle weakness (generalized): Secondary | ICD-10-CM | POA: Diagnosis not present

## 2012-04-07 DIAGNOSIS — I872 Venous insufficiency (chronic) (peripheral): Secondary | ICD-10-CM | POA: Diagnosis not present

## 2012-04-07 DIAGNOSIS — Z9181 History of falling: Secondary | ICD-10-CM | POA: Diagnosis not present

## 2012-04-07 DIAGNOSIS — M6281 Muscle weakness (generalized): Secondary | ICD-10-CM | POA: Diagnosis not present

## 2012-04-07 DIAGNOSIS — R609 Edema, unspecified: Secondary | ICD-10-CM | POA: Diagnosis not present

## 2012-04-07 DIAGNOSIS — G2581 Restless legs syndrome: Secondary | ICD-10-CM | POA: Diagnosis not present

## 2012-04-10 DIAGNOSIS — G2581 Restless legs syndrome: Secondary | ICD-10-CM | POA: Diagnosis not present

## 2012-04-10 DIAGNOSIS — Z9181 History of falling: Secondary | ICD-10-CM | POA: Diagnosis not present

## 2012-04-10 DIAGNOSIS — R609 Edema, unspecified: Secondary | ICD-10-CM | POA: Diagnosis not present

## 2012-04-10 DIAGNOSIS — I872 Venous insufficiency (chronic) (peripheral): Secondary | ICD-10-CM | POA: Diagnosis not present

## 2012-04-10 DIAGNOSIS — M6281 Muscle weakness (generalized): Secondary | ICD-10-CM | POA: Diagnosis not present

## 2012-04-11 DIAGNOSIS — R609 Edema, unspecified: Secondary | ICD-10-CM | POA: Diagnosis not present

## 2012-04-11 DIAGNOSIS — I872 Venous insufficiency (chronic) (peripheral): Secondary | ICD-10-CM | POA: Diagnosis not present

## 2012-04-11 DIAGNOSIS — Z9181 History of falling: Secondary | ICD-10-CM | POA: Diagnosis not present

## 2012-04-11 DIAGNOSIS — G2581 Restless legs syndrome: Secondary | ICD-10-CM | POA: Diagnosis not present

## 2012-04-11 DIAGNOSIS — M6281 Muscle weakness (generalized): Secondary | ICD-10-CM | POA: Diagnosis not present

## 2012-04-13 DIAGNOSIS — G2581 Restless legs syndrome: Secondary | ICD-10-CM | POA: Diagnosis not present

## 2012-05-17 DIAGNOSIS — E785 Hyperlipidemia, unspecified: Secondary | ICD-10-CM | POA: Diagnosis not present

## 2012-05-17 DIAGNOSIS — F411 Generalized anxiety disorder: Secondary | ICD-10-CM | POA: Diagnosis not present

## 2012-05-17 DIAGNOSIS — E039 Hypothyroidism, unspecified: Secondary | ICD-10-CM | POA: Diagnosis not present

## 2012-05-17 DIAGNOSIS — K5900 Constipation, unspecified: Secondary | ICD-10-CM | POA: Diagnosis not present

## 2012-05-29 ENCOUNTER — Encounter: Payer: Self-pay | Admitting: *Deleted

## 2012-06-08 ENCOUNTER — Ambulatory Visit (INDEPENDENT_AMBULATORY_CARE_PROVIDER_SITE_OTHER): Payer: Medicare Other | Admitting: Obstetrics & Gynecology

## 2012-06-08 ENCOUNTER — Encounter: Payer: Self-pay | Admitting: Obstetrics & Gynecology

## 2012-06-08 VITALS — BP 140/60 | Wt 153.0 lb

## 2012-06-08 DIAGNOSIS — B373 Candidiasis of vulva and vagina: Secondary | ICD-10-CM

## 2012-06-08 DIAGNOSIS — B3731 Acute candidiasis of vulva and vagina: Secondary | ICD-10-CM

## 2012-06-13 DIAGNOSIS — E785 Hyperlipidemia, unspecified: Secondary | ICD-10-CM | POA: Diagnosis not present

## 2012-06-13 DIAGNOSIS — S01502A Unspecified open wound of oral cavity, initial encounter: Secondary | ICD-10-CM | POA: Diagnosis not present

## 2012-06-13 DIAGNOSIS — R609 Edema, unspecified: Secondary | ICD-10-CM | POA: Diagnosis not present

## 2012-06-13 DIAGNOSIS — E039 Hypothyroidism, unspecified: Secondary | ICD-10-CM | POA: Diagnosis not present

## 2012-06-14 NOTE — Progress Notes (Signed)
Patient ID: Christine Hancock, female   DOB: February 09, 1918, 79 y.o.   MRN: [id number] PAtient presents complaining of vaginal itching and burning for a few weeks.  No odor.  No antibiotics Exam Obvious yeast vulvovaginitis No evidence of LSA  Diflucan 100 mg daily for a week Follow up in 2 weeks

## 2012-08-08 DIAGNOSIS — E039 Hypothyroidism, unspecified: Secondary | ICD-10-CM | POA: Diagnosis not present

## 2012-08-08 DIAGNOSIS — F411 Generalized anxiety disorder: Secondary | ICD-10-CM | POA: Diagnosis not present

## 2012-08-08 DIAGNOSIS — E785 Hyperlipidemia, unspecified: Secondary | ICD-10-CM | POA: Diagnosis not present

## 2012-08-10 DIAGNOSIS — R609 Edema, unspecified: Secondary | ICD-10-CM | POA: Diagnosis not present

## 2012-08-10 DIAGNOSIS — Z7901 Long term (current) use of anticoagulants: Secondary | ICD-10-CM | POA: Diagnosis not present

## 2012-08-21 DIAGNOSIS — I809 Phlebitis and thrombophlebitis of unspecified site: Secondary | ICD-10-CM | POA: Diagnosis not present

## 2012-08-21 DIAGNOSIS — I10 Essential (primary) hypertension: Secondary | ICD-10-CM | POA: Diagnosis not present

## 2012-08-30 DIAGNOSIS — M79609 Pain in unspecified limb: Secondary | ICD-10-CM | POA: Diagnosis not present

## 2012-08-30 DIAGNOSIS — B351 Tinea unguium: Secondary | ICD-10-CM | POA: Diagnosis not present

## 2012-10-12 DIAGNOSIS — E785 Hyperlipidemia, unspecified: Secondary | ICD-10-CM | POA: Diagnosis not present

## 2012-10-12 DIAGNOSIS — G609 Hereditary and idiopathic neuropathy, unspecified: Secondary | ICD-10-CM | POA: Diagnosis not present

## 2012-10-12 DIAGNOSIS — E039 Hypothyroidism, unspecified: Secondary | ICD-10-CM | POA: Diagnosis not present

## 2012-10-12 DIAGNOSIS — I10 Essential (primary) hypertension: Secondary | ICD-10-CM | POA: Diagnosis not present

## 2012-10-17 DIAGNOSIS — L659 Nonscarring hair loss, unspecified: Secondary | ICD-10-CM | POA: Diagnosis not present

## 2012-10-17 DIAGNOSIS — D2330 Other benign neoplasm of skin of unspecified part of face: Secondary | ICD-10-CM | POA: Diagnosis not present

## 2012-10-17 DIAGNOSIS — L570 Actinic keratosis: Secondary | ICD-10-CM | POA: Diagnosis not present

## 2012-10-17 DIAGNOSIS — D485 Neoplasm of uncertain behavior of skin: Secondary | ICD-10-CM | POA: Diagnosis not present

## 2012-11-08 DIAGNOSIS — I10 Essential (primary) hypertension: Secondary | ICD-10-CM | POA: Diagnosis not present

## 2012-11-14 DIAGNOSIS — I739 Peripheral vascular disease, unspecified: Secondary | ICD-10-CM | POA: Diagnosis not present

## 2012-12-06 DIAGNOSIS — Z23 Encounter for immunization: Secondary | ICD-10-CM | POA: Diagnosis not present

## 2013-01-10 DIAGNOSIS — I10 Essential (primary) hypertension: Secondary | ICD-10-CM | POA: Diagnosis not present

## 2013-01-10 DIAGNOSIS — E039 Hypothyroidism, unspecified: Secondary | ICD-10-CM | POA: Diagnosis not present

## 2013-01-10 DIAGNOSIS — D649 Anemia, unspecified: Secondary | ICD-10-CM | POA: Diagnosis not present

## 2013-01-10 DIAGNOSIS — F329 Major depressive disorder, single episode, unspecified: Secondary | ICD-10-CM | POA: Diagnosis not present

## 2013-01-11 DIAGNOSIS — E039 Hypothyroidism, unspecified: Secondary | ICD-10-CM | POA: Diagnosis not present

## 2013-01-11 DIAGNOSIS — I10 Essential (primary) hypertension: Secondary | ICD-10-CM | POA: Diagnosis not present

## 2013-01-11 DIAGNOSIS — D649 Anemia, unspecified: Secondary | ICD-10-CM | POA: Diagnosis not present

## 2013-02-20 DIAGNOSIS — I739 Peripheral vascular disease, unspecified: Secondary | ICD-10-CM | POA: Diagnosis not present

## 2013-03-01 DIAGNOSIS — E785 Hyperlipidemia, unspecified: Secondary | ICD-10-CM | POA: Diagnosis not present

## 2013-03-01 DIAGNOSIS — F411 Generalized anxiety disorder: Secondary | ICD-10-CM | POA: Diagnosis not present

## 2013-03-01 DIAGNOSIS — I10 Essential (primary) hypertension: Secondary | ICD-10-CM | POA: Diagnosis not present

## 2013-03-01 DIAGNOSIS — E039 Hypothyroidism, unspecified: Secondary | ICD-10-CM | POA: Diagnosis not present

## 2013-03-29 DIAGNOSIS — H2510 Age-related nuclear cataract, unspecified eye: Secondary | ICD-10-CM | POA: Diagnosis not present

## 2013-03-29 DIAGNOSIS — H524 Presbyopia: Secondary | ICD-10-CM | POA: Diagnosis not present

## 2013-03-29 DIAGNOSIS — H25019 Cortical age-related cataract, unspecified eye: Secondary | ICD-10-CM | POA: Diagnosis not present

## 2013-04-12 DIAGNOSIS — I872 Venous insufficiency (chronic) (peripheral): Secondary | ICD-10-CM | POA: Diagnosis not present

## 2013-04-12 DIAGNOSIS — I10 Essential (primary) hypertension: Secondary | ICD-10-CM | POA: Diagnosis not present

## 2013-04-12 DIAGNOSIS — L989 Disorder of the skin and subcutaneous tissue, unspecified: Secondary | ICD-10-CM | POA: Diagnosis not present

## 2013-04-12 DIAGNOSIS — F411 Generalized anxiety disorder: Secondary | ICD-10-CM | POA: Diagnosis not present

## 2013-05-08 DIAGNOSIS — I739 Peripheral vascular disease, unspecified: Secondary | ICD-10-CM | POA: Diagnosis not present

## 2013-06-14 DIAGNOSIS — H40019 Open angle with borderline findings, low risk, unspecified eye: Secondary | ICD-10-CM | POA: Diagnosis not present

## 2013-06-14 DIAGNOSIS — H2589 Other age-related cataract: Secondary | ICD-10-CM | POA: Diagnosis not present

## 2013-06-19 ENCOUNTER — Encounter (HOSPITAL_COMMUNITY): Payer: Self-pay | Admitting: Pharmacy Technician

## 2013-06-22 ENCOUNTER — Other Ambulatory Visit: Payer: Self-pay

## 2013-06-22 ENCOUNTER — Encounter (HOSPITAL_COMMUNITY)
Admission: RE | Admit: 2013-06-22 | Discharge: 2013-06-22 | Disposition: A | Discharge status: Home / Self Care | Payer: Medicare Other | Source: Ambulatory Visit | Attending: Ophthalmology | Admitting: Ophthalmology

## 2013-06-22 ENCOUNTER — Encounter (HOSPITAL_COMMUNITY): Payer: Self-pay

## 2013-06-22 DIAGNOSIS — Z01810 Encounter for preprocedural cardiovascular examination: Secondary | ICD-10-CM | POA: Diagnosis not present

## 2013-06-22 DIAGNOSIS — Z01812 Encounter for preprocedural laboratory examination: Secondary | ICD-10-CM | POA: Insufficient documentation

## 2013-06-22 LAB — BASIC METABOLIC PANEL
BUN: 18 mg/dL (ref 6–23)
CHLORIDE: 101 meq/L (ref 96–112)
CO2: 25 meq/L (ref 19–32)
Calcium: 9.2 mg/dL (ref 8.4–10.5)
Creatinine, Ser: 1.21 mg/dL — ABNORMAL HIGH (ref 0.50–1.10)
GFR calc Af Amer: 43 mL/min — ABNORMAL LOW (ref >90)
GFR calc non Af Amer: 37 mL/min — ABNORMAL LOW (ref >90)
GLUCOSE: 102 mg/dL — AB (ref 70–99)
POTASSIUM: 4.4 meq/L (ref 3.7–5.3)
SODIUM: 139 meq/L (ref 137–147)

## 2013-06-22 LAB — HEMOGLOBIN AND HEMATOCRIT, BLOOD
HCT: 33.8 % — ABNORMAL LOW (ref 36.0–46.0)
Hemoglobin: 11 g/dL — ABNORMAL LOW (ref 12.0–15.0)

## 2013-06-22 NOTE — Pre-Procedure Instructions (Signed)
Patient does  Not want to do mychart.

## 2013-06-22 NOTE — Patient Instructions (Signed)
Your procedure is scheduled on: 06/28/2013  Report to [facility] at  1000  AM.  Call this number if you have problems the morning of surgery: [phone]   Do not eat food or drink liquids :After Midnight.      Take these medicines the morning of surgery with A SIP OF WATER: neurontin, levothyroxine, lisinopril, procardia, prilosec, risperidone   Do not wear jewelry, make-up or nail polish.  Do not wear lotions, powders, or perfumes.   Do not shave 48 hours prior to surgery.  Do not bring valuables to the hospital.  Contacts, dentures or bridgework may not be worn into surgery.  Leave suitcase in the car. After surgery it may be brought to your room.  For patients admitted to the hospital, checkout time is 11:00 AM the day of discharge.   Patients discharged the day of surgery will not be allowed to drive home.  :     Please read over the following fact sheets that you were given: Coughing and Deep Breathing, Surgical Site Infection Prevention, Anesthesia Post-op Instructions and Care and Recovery After Surgery    Cataract A cataract is a clouding of the lens of the eye. When a lens becomes cloudy, vision is reduced based on the degree and nature of the clouding. Many cataracts reduce vision to some degree. Some cataracts make people more near-sighted as they develop. Other cataracts increase glare. Cataracts that are ignored and become worse can sometimes look white. The white color can be seen through the pupil. CAUSES   Aging. However, cataracts may occur at any age, even in newborns.   Certain drugs.   Trauma to the eye.   Certain diseases such as diabetes.   Specific eye diseases such as chronic inflammation inside the eye or a sudden attack of a rare form of glaucoma.   Inherited or acquired medical problems.  SYMPTOMS   Gradual, progressive drop in vision in the affected eye.   Severe, rapid visual loss. This most often happens when trauma is the cause.  DIAGNOSIS  To  detect a cataract, an eye doctor examines the lens. Cataracts are best diagnosed with an exam of the eyes with the pupils enlarged (dilated) by drops.  TREATMENT  For an early cataract, vision may improve by using different eyeglasses or stronger lighting. If that does not help your vision, surgery is the only effective treatment. A cataract needs to be surgically removed when vision loss interferes with your everyday activities, such as driving, reading, or watching TV. A cataract may also have to be removed if it prevents examination or treatment of another eye problem. Surgery removes the cloudy lens and usually replaces it with a substitute lens (intraocular lens, IOL).  At a time when both you and your doctor agree, the cataract will be surgically removed. If you have cataracts in both eyes, only one is usually removed at a time. This allows the operated eye to heal and be out of danger from any possible problems after surgery (such as infection or poor wound healing). In rare cases, a cataract may be doing damage to your eye. In these cases, your caregiver may advise surgical removal right away. The vast majority of people who have cataract surgery have better vision afterward. HOME CARE INSTRUCTIONS  If you are not planning surgery, you may be asked to do the following:  Use different eyeglasses.   Use stronger or brighter lighting.   Ask your eye doctor about reducing your medicine  dose or changing medicines if it is thought that a medicine caused your cataract. Changing medicines does not make the cataract go away on its own.   Become familiar with your surroundings. Poor vision can lead to injury. Avoid bumping into things on the affected side. You are at a higher risk for tripping or falling.   Exercise extreme care when driving or operating machinery.   Wear sunglasses if you are sensitive to bright light or experiencing problems with glare.  SEEK IMMEDIATE MEDICAL CARE IF:   You have  a worsening or sudden vision loss.   You notice redness, swelling, or increasing pain in the eye.   You have a fever.  Document Released: 02/08/2005 Document Revised: 01/28/2011 Document Reviewed: 10/02/2010 [facility] Patient Information 2012 [place].PATIENT INSTRUCTIONS POST-ANESTHESIA  IMMEDIATELY FOLLOWING SURGERY:  Do not drive or operate machinery for the first twenty four hours after surgery.  Do not make any important decisions for twenty four hours after surgery or while taking narcotic pain medications or sedatives.  If you develop intractable nausea and vomiting or a severe headache please notify your doctor immediately.  FOLLOW-UP:  Please make an appointment with your surgeon as instructed. You do not need to follow up with anesthesia unless specifically instructed to do so.  WOUND CARE INSTRUCTIONS (if applicable):  Keep a dry clean dressing on the anesthesia/puncture wound site if there is drainage.  Once the wound has quit draining you may leave it open to air.  Generally you should leave the bandage intact for twenty four hours unless there is drainage.  If the epidural site drains for more than 36-48 hours please call the anesthesia department.  QUESTIONS?:  Please feel free to call your physician or the hospital operator if you have any questions, and they will be happy to assist you.

## 2013-06-28 ENCOUNTER — Encounter (HOSPITAL_COMMUNITY): Payer: Medicare Other | Admitting: Anesthesiology

## 2013-06-28 ENCOUNTER — Encounter (HOSPITAL_COMMUNITY): Payer: Self-pay | Admitting: *Deleted

## 2013-06-28 ENCOUNTER — Ambulatory Visit (HOSPITAL_COMMUNITY)
Admission: RE | Admit: 2013-06-28 | Discharge: 2013-06-28 | Disposition: A | Discharge status: Home / Self Care | Payer: Medicare Other | Source: Ambulatory Visit | Attending: Ophthalmology | Admitting: Ophthalmology

## 2013-06-28 ENCOUNTER — Encounter (HOSPITAL_COMMUNITY)
Admission: RE | Disposition: A | Discharge status: Home / Self Care | Payer: Self-pay | Source: Ambulatory Visit | Attending: Ophthalmology

## 2013-06-28 ENCOUNTER — Ambulatory Visit (HOSPITAL_COMMUNITY): Payer: Medicare Other | Admitting: Anesthesiology

## 2013-06-28 DIAGNOSIS — E039 Hypothyroidism, unspecified: Secondary | ICD-10-CM | POA: Diagnosis not present

## 2013-06-28 DIAGNOSIS — H269 Unspecified cataract: Secondary | ICD-10-CM | POA: Diagnosis not present

## 2013-06-28 DIAGNOSIS — Z87891 Personal history of nicotine dependence: Secondary | ICD-10-CM | POA: Insufficient documentation

## 2013-06-28 DIAGNOSIS — F411 Generalized anxiety disorder: Secondary | ICD-10-CM | POA: Insufficient documentation

## 2013-06-28 DIAGNOSIS — Z79899 Other long term (current) drug therapy: Secondary | ICD-10-CM | POA: Diagnosis not present

## 2013-06-28 DIAGNOSIS — H2589 Other age-related cataract: Secondary | ICD-10-CM | POA: Diagnosis not present

## 2013-06-28 DIAGNOSIS — D649 Anemia, unspecified: Secondary | ICD-10-CM | POA: Diagnosis not present

## 2013-06-28 DIAGNOSIS — K219 Gastro-esophageal reflux disease without esophagitis: Secondary | ICD-10-CM | POA: Diagnosis not present

## 2013-06-28 DIAGNOSIS — I498 Other specified cardiac arrhythmias: Secondary | ICD-10-CM | POA: Diagnosis not present

## 2013-06-28 DIAGNOSIS — N289 Disorder of kidney and ureter, unspecified: Secondary | ICD-10-CM | POA: Diagnosis not present

## 2013-06-28 DIAGNOSIS — I10 Essential (primary) hypertension: Secondary | ICD-10-CM | POA: Diagnosis not present

## 2013-06-28 HISTORY — PX: CATARACT EXTRACTION W/PHACO: SHX586

## 2013-06-28 LAB — GLUCOSE, CAPILLARY: GLUCOSE-CAPILLARY: 96 mg/dL (ref 70–99)

## 2013-06-28 SURGERY — PHACOEMULSIFICATION, CATARACT, WITH IOL INSERTION
Anesthesia: Monitor Anesthesia Care | Site: Eye | Laterality: Right

## 2013-06-28 MED ORDER — NEOMYCIN-POLYMYXIN-DEXAMETH 3.5-10000-0.1 OP SUSP
OPHTHALMIC | Status: AC
  Filled 2013-06-28: qty 5

## 2013-06-28 MED ORDER — LACTATED RINGERS IV SOLN
INTRAVENOUS | Status: DC
  Administered 2013-06-28: 11:00:00 via INTRAVENOUS

## 2013-06-28 MED ORDER — TETRACAINE HCL 0.5 % OP SOLN
1.0000 [drp] | OPHTHALMIC | Status: AC
  Administered 2013-06-28 (×3): 1 [drp] via OPHTHALMIC

## 2013-06-28 MED ORDER — CYCLOPENTOLATE-PHENYLEPHRINE OP SOLN OPTIME - NO CHARGE
OPHTHALMIC | Status: AC
  Filled 2013-06-28: qty 2

## 2013-06-28 MED ORDER — FENTANYL CITRATE 0.05 MG/ML IJ SOLN
INTRAMUSCULAR | Status: AC
  Filled 2013-06-28: qty 2

## 2013-06-28 MED ORDER — MIDAZOLAM HCL 2 MG/2ML IJ SOLN
INTRAMUSCULAR | Status: AC
  Filled 2013-06-28: qty 2

## 2013-06-28 MED ORDER — MIDAZOLAM HCL 2 MG/2ML IJ SOLN
1.0000 mg | INTRAMUSCULAR | Status: DC | PRN
  Administered 2013-06-28: 2 mg via INTRAVENOUS

## 2013-06-28 MED ORDER — POVIDONE-IODINE 5 % OP SOLN
OPHTHALMIC | Status: DC | PRN
  Administered 2013-06-28: 1 via OPHTHALMIC

## 2013-06-28 MED ORDER — NEOMYCIN-POLYMYXIN-DEXAMETH 3.5-10000-0.1 OP SUSP
OPHTHALMIC | Status: DC | PRN
  Administered 2013-06-28: 2 [drp] via OPHTHALMIC

## 2013-06-28 MED ORDER — CYCLOPENTOLATE-PHENYLEPHRINE 0.2-1 % OP SOLN
1.0000 [drp] | OPHTHALMIC | Status: AC
  Administered 2013-06-28 (×3): 1 [drp] via OPHTHALMIC

## 2013-06-28 MED ORDER — PHENYLEPHRINE HCL 2.5 % OP SOLN
OPHTHALMIC | Status: AC
  Filled 2013-06-28: qty 15

## 2013-06-28 MED ORDER — LIDOCAINE HCL 3.5 % OP GEL
OPHTHALMIC | Status: AC
  Filled 2013-06-28: qty 1

## 2013-06-28 MED ORDER — LIDOCAINE HCL 3.5 % OP GEL: 1.0000 "application " | Freq: Once | OPHTHALMIC | Status: DC

## 2013-06-28 MED ORDER — PHENYLEPHRINE HCL 2.5 % OP SOLN
1.0000 [drp] | OPHTHALMIC | Status: AC
  Administered 2013-06-28 (×3): 1 [drp] via OPHTHALMIC

## 2013-06-28 MED ORDER — ONDANSETRON HCL 4 MG/2ML IJ SOLN: 4.0000 mg | Freq: Once | INTRAMUSCULAR | Status: DC | PRN

## 2013-06-28 MED ORDER — PROVISC 10 MG/ML IO SOLN
INTRAOCULAR | Status: DC | PRN
  Administered 2013-06-28: 0.85 mL via INTRAOCULAR

## 2013-06-28 MED ORDER — BSS IO SOLN
INTRAOCULAR | Status: DC | PRN
  Administered 2013-06-28: 15 mL via INTRAOCULAR

## 2013-06-28 MED ORDER — TETRACAINE HCL 0.5 % OP SOLN
OPHTHALMIC | Status: AC
  Filled 2013-06-28: qty 2

## 2013-06-28 MED ORDER — EPINEPHRINE HCL 1 MG/ML IJ SOLN
INTRAMUSCULAR | Status: AC
  Filled 2013-06-28: qty 1

## 2013-06-28 MED ORDER — FENTANYL CITRATE 0.05 MG/ML IJ SOLN
25.0000 ug | INTRAMUSCULAR | Status: DC | PRN
Start: 2013-06-28 — End: 2013-06-28

## 2013-06-28 MED ORDER — BSS IO SOLN
INTRAOCULAR | Status: DC | PRN
  Administered 2013-06-28: 12:00:00

## 2013-06-28 MED ORDER — LIDOCAINE 3.5 % OP GEL OPTIME - NO CHARGE
OPHTHALMIC | Status: DC | PRN
  Administered 2013-06-28: 2 [drp] via OPHTHALMIC

## 2013-06-28 MED ORDER — LIDOCAINE HCL (PF) 1 % IJ SOLN
INTRAMUSCULAR | Status: AC
  Filled 2013-06-28: qty 2

## 2013-06-28 MED ORDER — LIDOCAINE HCL (PF) 1 % IJ SOLN
INTRAMUSCULAR | Status: DC | PRN
  Administered 2013-06-28: .4 mL

## 2013-06-28 MED ORDER — FENTANYL CITRATE 0.05 MG/ML IJ SOLN
25.0000 ug | INTRAMUSCULAR | Status: AC
  Administered 2013-06-28: 25 ug via INTRAVENOUS

## 2013-06-28 SURGICAL SUPPLY — 35 items
CAPSULAR TENSION RING-AMO (OPHTHALMIC RELATED) IMPLANT
CLOTH BEACON ORANGE TIMEOUT ST (SAFETY) ×3 IMPLANT
EYE SHIELD UNIVERSAL CLEAR (GAUZE/BANDAGES/DRESSINGS) ×2 IMPLANT
GLOVE BIO SURGEON STRL SZ 6.5 (GLOVE) IMPLANT
GLOVE BIO SURGEONS STRL SZ 6.5 (GLOVE)
GLOVE BIOGEL PI IND STRL 6.5 (GLOVE) ×1 IMPLANT
GLOVE BIOGEL PI IND STRL 7.0 (GLOVE) IMPLANT
GLOVE BIOGEL PI IND STRL 7.5 (GLOVE) IMPLANT
GLOVE BIOGEL PI INDICATOR 6.5 (GLOVE) ×2
GLOVE BIOGEL PI INDICATOR 7.0 (GLOVE)
GLOVE BIOGEL PI INDICATOR 7.5 (GLOVE)
GLOVE ECLIPSE 6.5 STRL STRAW (GLOVE) IMPLANT
GLOVE ECLIPSE 7.0 STRL STRAW (GLOVE) IMPLANT
GLOVE ECLIPSE 7.5 STRL STRAW (GLOVE) IMPLANT
GLOVE EXAM NITRILE LRG STRL (GLOVE) IMPLANT
GLOVE EXAM NITRILE MD LF STRL (GLOVE) IMPLANT
GLOVE INDICATOR 8.5 STRL (GLOVE) ×2 IMPLANT
GLOVE SKINSENSE NS SZ6.5 (GLOVE)
GLOVE SKINSENSE NS SZ7.0 (GLOVE)
GLOVE SKINSENSE STRL SZ6.5 (GLOVE) IMPLANT
GLOVE SKINSENSE STRL SZ7.0 (GLOVE) IMPLANT
GLOVE SS N UNI LF 7.0 STRL (GLOVE) ×3 IMPLANT
KIT VITRECTOMY (OPHTHALMIC RELATED) IMPLANT
PAD ARMBOARD 7.5X6 YLW CONV (MISCELLANEOUS) ×2 IMPLANT
PROC W NO LENS (INTRAOCULAR LENS)
PROC W SPEC LENS (INTRAOCULAR LENS)
PROCESS W NO LENS (INTRAOCULAR LENS) IMPLANT
PROCESS W SPEC LENS (INTRAOCULAR LENS) IMPLANT
RING MALYGIN (MISCELLANEOUS) IMPLANT
SIGHTPATH CAT PROC W REG LENS (Ophthalmic Related) ×3 IMPLANT
SYR TB 1ML LL NO SAFETY (SYRINGE) ×2 IMPLANT
TAPE SURG TRANSPORE 1 IN (GAUZE/BANDAGES/DRESSINGS) ×1 IMPLANT
TAPE SURGICAL TRANSPORE 1 IN (GAUZE/BANDAGES/DRESSINGS) ×2
VISCOELASTIC ADDITIONAL (OPHTHALMIC RELATED) IMPLANT
WATER STERILE IRR 250ML POUR (IV SOLUTION) ×2 IMPLANT

## 2013-06-28 NOTE — Op Note (Signed)
Date of Admission: 06/28/2013  Date of Surgery: 06/28/2013   Pre-Op Dx: Cataract Right Eye  Post-Op Dx: Cataract Right  Eye,  Dx Code 366.19  Surgeon: Tonny Branch, M.D.  Assistants: None  Anesthesia: Topical with MAC  Indications: Painless, progressive loss of vision with compromise of daily activities.  Surgery: Cataract Extraction with Intraocular lens Implant Right Eye  Discription: The patient had dilating drops and viscous lidocaine placed into the Right eye in the pre-op holding area. After transfer to the operating room, a time out was performed. The patient was then prepped and draped. Beginning with a 31 degree blade a paracentesis port was made at the surgeon's 2 o'clock position. The anterior chamber was then filled with 1% non-preserved lidocaine. This was followed by filling the anterior chamber with Provisc.  A 2.63mm keratome blade was used to make a clear corneal incision at the temporal limbus.  A bent cystatome needle was used to create a continuous tear capsulotomy. Hydrodissection was performed with balanced salt solution on a Fine canula. The lens nucleus was then removed using the phacoemulsification handpiece. Residual cortex was removed with the I&A handpiece. The anterior chamber and capsular bag were refilled with Provisc. A posterior chamber intraocular lens was placed into the capsular bag with it's injector. The implant was positioned with the Kuglan hook. The Provisc was then removed from the anterior chamber and capsular bag with the I&A handpiece. Stromal hydration of the main incision and paracentesis port was performed with BSS on a Fine canula. The wounds were tested for leak which was negative. The patient tolerated the procedure well. There were no operative complications. The patient was then transferred to the recovery room in stable condition.  Complications: None  Specimen: None  EBL: None  Prosthetic device: Hoya iSert 250, power 22.0 D, SN [id number].

## 2013-06-28 NOTE — Anesthesia Preprocedure Evaluation (Signed)
Anesthesia Evaluation  Patient identified by MRN, date of birth, ID band Patient awake    Reviewed: Allergy & Precautions, H&P , NPO status , Patient's Chart, lab work & pertinent test results  Airway Mallampati: III TM Distance: <3 FB     Dental  (+) Edentulous Upper, Edentulous Lower   Pulmonary former smoker,  breath sounds clear to auscultation        Cardiovascular hypertension, Pt. on medications Rhythm:Regular Rate:Normal     Neuro/Psych Anxiety    GI/Hepatic GERD-  ,  Endo/Other  Hypothyroidism   Renal/GU Renal disease     Musculoskeletal   Abdominal   Peds  Hematology  (+) anemia ,   Anesthesia Other Findings   Reproductive/Obstetrics                           Anesthesia Physical Anesthesia Plan  ASA: III  Anesthesia Plan: MAC   Post-op Pain Management:    Induction: Intravenous  Airway Management Planned: Nasal Cannula  Additional Equipment:   Intra-op Plan:   Post-operative Plan:   Informed Consent: I have reviewed the patients History and Physical, chart, labs and discussed the procedure including the risks, benefits and alternatives for the proposed anesthesia with the patient or authorized representative who has indicated his/her understanding and acceptance.     Plan Discussed with:   Anesthesia Plan Comments:         Anesthesia Quick Evaluation

## 2013-06-28 NOTE — Anesthesia Postprocedure Evaluation (Signed)
  Anesthesia Post-op Note  Patient: Christine Hancock  Procedure(s) Performed: Procedure(s) with comments: CATARACT EXTRACTION PHACO AND INTRAOCULAR LENS PLACEMENT (IOC) (Right) - CDE:13.79  Patient Location: Short Stay  Anesthesia Type:MAC  Level of Consciousness: awake, alert , oriented and patient cooperative  Airway and Oxygen Therapy: Patient Spontanous Breathing  Post-op Pain: none  Post-op Assessment: Post-op Vital signs reviewed, Patient's Cardiovascular Status Stable, Respiratory Function Stable, Patent Airway and Pain level controlled  Post-op Vital Signs: Reviewed and stable  Last Vitals:  Filed Vitals:   06/28/13 1125  BP: 128/69  Pulse:   Temp:   Resp: 14    Complications: No apparent anesthesia complications

## 2013-06-28 NOTE — Transfer of Care (Signed)
Immediate Anesthesia Transfer of Care Note  Patient: Christine Hancock  Procedure(s) Performed: Procedure(s) with comments: CATARACT EXTRACTION PHACO AND INTRAOCULAR LENS PLACEMENT (IOC) (Right) - CDE:13.79  Patient Location: Short Stay  Anesthesia Type:MAC  Level of Consciousness: awake, alert , oriented and patient cooperative  Airway & Oxygen Therapy: Patient Spontanous Breathing  Post-op Assessment: Report given to PACU RN, Post -op Vital signs reviewed and stable and Patient moving all extremities  Post vital signs: Reviewed and stable  Complications: No apparent anesthesia complications

## 2013-06-28 NOTE — H&P (Signed)
I have reviewed the H&P, the patient was re-examined, and I have identified no interval changes in medical condition and plan of care since the history and physical of record  

## 2013-06-28 NOTE — Discharge Instructions (Signed)

## 2013-06-29 ENCOUNTER — Encounter (HOSPITAL_COMMUNITY): Payer: Self-pay | Admitting: Ophthalmology

## 2013-07-09 DIAGNOSIS — H2589 Other age-related cataract: Secondary | ICD-10-CM | POA: Diagnosis not present

## 2013-07-09 DIAGNOSIS — H5231 Anisometropia: Secondary | ICD-10-CM | POA: Diagnosis not present

## 2013-07-10 ENCOUNTER — Encounter (HOSPITAL_COMMUNITY): Payer: Self-pay | Admitting: Pharmacy Technician

## 2013-07-10 DIAGNOSIS — F411 Generalized anxiety disorder: Secondary | ICD-10-CM | POA: Diagnosis not present

## 2013-07-10 DIAGNOSIS — E039 Hypothyroidism, unspecified: Secondary | ICD-10-CM | POA: Diagnosis not present

## 2013-07-10 DIAGNOSIS — I10 Essential (primary) hypertension: Secondary | ICD-10-CM | POA: Diagnosis not present

## 2013-07-12 DIAGNOSIS — R233 Spontaneous ecchymoses: Secondary | ICD-10-CM | POA: Diagnosis not present

## 2013-07-12 DIAGNOSIS — R609 Edema, unspecified: Secondary | ICD-10-CM | POA: Diagnosis not present

## 2013-07-12 DIAGNOSIS — E039 Hypothyroidism, unspecified: Secondary | ICD-10-CM | POA: Diagnosis not present

## 2013-07-12 DIAGNOSIS — R0602 Shortness of breath: Secondary | ICD-10-CM | POA: Diagnosis not present

## 2013-07-19 DIAGNOSIS — L819 Disorder of pigmentation, unspecified: Secondary | ICD-10-CM | POA: Diagnosis not present

## 2013-07-19 DIAGNOSIS — I872 Venous insufficiency (chronic) (peripheral): Secondary | ICD-10-CM | POA: Diagnosis not present

## 2013-07-20 ENCOUNTER — Encounter (HOSPITAL_COMMUNITY)
Admission: RE | Admit: 2013-07-20 | Discharge: 2013-07-20 | Disposition: A | Discharge status: Home / Self Care | Payer: Medicare Other | Source: Ambulatory Visit | Attending: Ophthalmology | Admitting: Ophthalmology

## 2013-07-20 ENCOUNTER — Encounter (HOSPITAL_COMMUNITY): Payer: Self-pay

## 2013-07-24 DIAGNOSIS — I739 Peripheral vascular disease, unspecified: Secondary | ICD-10-CM | POA: Diagnosis not present

## 2013-07-25 MED ORDER — NEOMYCIN-POLYMYXIN-DEXAMETH 3.5-10000-0.1 OP SUSP
OPHTHALMIC | Status: AC
  Filled 2013-07-25: qty 5

## 2013-07-25 MED ORDER — PHENYLEPHRINE HCL 2.5 % OP SOLN
OPHTHALMIC | Status: AC
  Filled 2013-07-25: qty 15

## 2013-07-25 MED ORDER — CYCLOPENTOLATE-PHENYLEPHRINE OP SOLN OPTIME - NO CHARGE
OPHTHALMIC | Status: AC
  Filled 2013-07-25: qty 2

## 2013-07-25 MED ORDER — TETRACAINE HCL 0.5 % OP SOLN
OPHTHALMIC | Status: AC
  Filled 2013-07-25: qty 2

## 2013-07-26 ENCOUNTER — Encounter (HOSPITAL_COMMUNITY)
Admission: RE | Disposition: A | Discharge status: Home / Self Care | Payer: Self-pay | Source: Ambulatory Visit | Attending: Ophthalmology

## 2013-07-26 ENCOUNTER — Ambulatory Visit (HOSPITAL_COMMUNITY)
Admission: RE | Admit: 2013-07-26 | Discharge: 2013-07-26 | Disposition: A | Discharge status: Home / Self Care | Payer: Medicare Other | Source: Ambulatory Visit | Attending: Ophthalmology | Admitting: Ophthalmology

## 2013-07-26 ENCOUNTER — Encounter (HOSPITAL_COMMUNITY): Payer: Self-pay | Admitting: Ophthalmology

## 2013-07-26 ENCOUNTER — Encounter (HOSPITAL_COMMUNITY): Payer: Medicare Other | Admitting: Anesthesiology

## 2013-07-26 ENCOUNTER — Ambulatory Visit (HOSPITAL_COMMUNITY): Payer: Medicare Other | Admitting: Anesthesiology

## 2013-07-26 DIAGNOSIS — F411 Generalized anxiety disorder: Secondary | ICD-10-CM | POA: Diagnosis not present

## 2013-07-26 DIAGNOSIS — H269 Unspecified cataract: Secondary | ICD-10-CM | POA: Diagnosis not present

## 2013-07-26 DIAGNOSIS — K219 Gastro-esophageal reflux disease without esophagitis: Secondary | ICD-10-CM | POA: Diagnosis not present

## 2013-07-26 DIAGNOSIS — E039 Hypothyroidism, unspecified: Secondary | ICD-10-CM | POA: Insufficient documentation

## 2013-07-26 DIAGNOSIS — I10 Essential (primary) hypertension: Secondary | ICD-10-CM | POA: Insufficient documentation

## 2013-07-26 DIAGNOSIS — Z87891 Personal history of nicotine dependence: Secondary | ICD-10-CM | POA: Insufficient documentation

## 2013-07-26 DIAGNOSIS — D649 Anemia, unspecified: Secondary | ICD-10-CM | POA: Insufficient documentation

## 2013-07-26 DIAGNOSIS — H2510 Age-related nuclear cataract, unspecified eye: Secondary | ICD-10-CM | POA: Diagnosis not present

## 2013-07-26 DIAGNOSIS — H2589 Other age-related cataract: Secondary | ICD-10-CM | POA: Diagnosis not present

## 2013-07-26 HISTORY — PX: CATARACT EXTRACTION W/PHACO: SHX586

## 2013-07-26 SURGERY — PHACOEMULSIFICATION, CATARACT, WITH IOL INSERTION
Anesthesia: Monitor Anesthesia Care | Site: Eye | Laterality: Left

## 2013-07-26 MED ORDER — FENTANYL CITRATE 0.05 MG/ML IJ SOLN
INTRAMUSCULAR | Status: AC
  Filled 2013-07-26: qty 2

## 2013-07-26 MED ORDER — POVIDONE-IODINE 5 % OP SOLN
OPHTHALMIC | Status: DC | PRN
  Administered 2013-07-26: 1 via OPHTHALMIC

## 2013-07-26 MED ORDER — BSS IO SOLN
INTRAOCULAR | Status: DC | PRN
  Administered 2013-07-26: 15 mL via INTRAOCULAR

## 2013-07-26 MED ORDER — EPINEPHRINE HCL 1 MG/ML IJ SOLN
INTRAMUSCULAR | Status: AC
  Filled 2013-07-26: qty 1

## 2013-07-26 MED ORDER — MIDAZOLAM HCL 2 MG/2ML IJ SOLN
1.0000 mg | INTRAMUSCULAR | Status: DC | PRN
  Administered 2013-07-26 (×2): 1 mg via INTRAVENOUS

## 2013-07-26 MED ORDER — LIDOCAINE HCL (PF) 1 % IJ SOLN
INTRAMUSCULAR | Status: DC | PRN
  Administered 2013-07-26: .4 mL

## 2013-07-26 MED ORDER — FENTANYL CITRATE 0.05 MG/ML IJ SOLN
25.0000 ug | INTRAMUSCULAR | Status: AC
  Administered 2013-07-26: 25 ug via INTRAVENOUS

## 2013-07-26 MED ORDER — EPINEPHRINE HCL 1 MG/ML IJ SOLN
INTRAOCULAR | Status: DC | PRN
  Administered 2013-07-26: 11:00:00

## 2013-07-26 MED ORDER — TETRACAINE HCL 0.5 % OP SOLN
1.0000 [drp] | OPHTHALMIC | Status: AC
  Administered 2013-07-26 (×3): 1 [drp] via OPHTHALMIC

## 2013-07-26 MED ORDER — LIDOCAINE HCL 3.5 % OP GEL
1.0000 "application " | Freq: Once | OPHTHALMIC | Status: AC
  Administered 2013-07-26: 1 via OPHTHALMIC

## 2013-07-26 MED ORDER — PROVISC 10 MG/ML IO SOLN
INTRAOCULAR | Status: DC | PRN
  Administered 2013-07-26: 0.85 mL via INTRAOCULAR

## 2013-07-26 MED ORDER — CYCLOPENTOLATE-PHENYLEPHRINE 0.2-1 % OP SOLN
1.0000 [drp] | OPHTHALMIC | Status: AC
  Administered 2013-07-26 (×3): 1 [drp] via OPHTHALMIC

## 2013-07-26 MED ORDER — MIDAZOLAM HCL 2 MG/2ML IJ SOLN
INTRAMUSCULAR | Status: AC
  Filled 2013-07-26: qty 2

## 2013-07-26 MED ORDER — PHENYLEPHRINE HCL 2.5 % OP SOLN
1.0000 [drp] | OPHTHALMIC | Status: AC
  Administered 2013-07-26 (×3): 1 [drp] via OPHTHALMIC

## 2013-07-26 MED ORDER — NEOMYCIN-POLYMYXIN-DEXAMETH 3.5-10000-0.1 OP SUSP
OPHTHALMIC | Status: DC | PRN
  Administered 2013-07-26: 2 [drp] via OPHTHALMIC

## 2013-07-26 MED ORDER — LACTATED RINGERS IV SOLN
INTRAVENOUS | Status: DC
  Administered 2013-07-26: 11:00:00 via INTRAVENOUS

## 2013-07-26 SURGICAL SUPPLY — 33 items
CAPSULAR TENSION RING-AMO (OPHTHALMIC RELATED) IMPLANT
CLOTH BEACON ORANGE TIMEOUT ST (SAFETY) ×2 IMPLANT
EYE SHIELD UNIVERSAL CLEAR (GAUZE/BANDAGES/DRESSINGS) ×2 IMPLANT
GLOVE BIO SURGEON STRL SZ 6.5 (GLOVE) IMPLANT
GLOVE BIO SURGEONS STRL SZ 6.5 (GLOVE)
GLOVE BIOGEL PI IND STRL 6.5 (GLOVE) IMPLANT
GLOVE BIOGEL PI IND STRL 7.0 (GLOVE) IMPLANT
GLOVE BIOGEL PI IND STRL 7.5 (GLOVE) IMPLANT
GLOVE BIOGEL PI INDICATOR 6.5 (GLOVE)
GLOVE BIOGEL PI INDICATOR 7.0 (GLOVE) ×4
GLOVE BIOGEL PI INDICATOR 7.5 (GLOVE)
GLOVE ECLIPSE 6.5 STRL STRAW (GLOVE) IMPLANT
GLOVE ECLIPSE 7.0 STRL STRAW (GLOVE) IMPLANT
GLOVE ECLIPSE 7.5 STRL STRAW (GLOVE) IMPLANT
GLOVE EXAM NITRILE LRG STRL (GLOVE) IMPLANT
GLOVE EXAM NITRILE MD LF STRL (GLOVE) IMPLANT
GLOVE SKINSENSE NS SZ6.5 (GLOVE)
GLOVE SKINSENSE NS SZ7.0 (GLOVE)
GLOVE SKINSENSE STRL SZ6.5 (GLOVE) IMPLANT
GLOVE SKINSENSE STRL SZ7.0 (GLOVE) IMPLANT
KIT VITRECTOMY (OPHTHALMIC RELATED) IMPLANT
PAD ARMBOARD 7.5X6 YLW CONV (MISCELLANEOUS) ×2 IMPLANT
PROC W NO LENS (INTRAOCULAR LENS)
PROC W SPEC LENS (INTRAOCULAR LENS)
PROCESS W NO LENS (INTRAOCULAR LENS) IMPLANT
PROCESS W SPEC LENS (INTRAOCULAR LENS) IMPLANT
RING MALYGIN (MISCELLANEOUS) IMPLANT
SIGHTPATH CAT PROC W REG LENS (Ophthalmic Related) ×3 IMPLANT
SYR TB 1ML LL NO SAFETY (SYRINGE) ×3 IMPLANT
TAPE SURG TRANSPORE 1 IN (GAUZE/BANDAGES/DRESSINGS) IMPLANT
TAPE SURGICAL TRANSPORE 1 IN (GAUZE/BANDAGES/DRESSINGS) ×2
VISCOELASTIC ADDITIONAL (OPHTHALMIC RELATED) IMPLANT
WATER STERILE IRR 250ML POUR (IV SOLUTION) ×2 IMPLANT

## 2013-07-26 NOTE — Anesthesia Postprocedure Evaluation (Signed)
  Anesthesia Post-op Note  Patient: Christine Hancock  Procedure(s) Performed: Procedure(s) with comments: CATARACT EXTRACTION PHACO AND INTRAOCULAR LENS PLACEMENT LEFT EYE (Left) - CDE: 11.76  Patient Location: Short Stay  Anesthesia Type:MAC  Level of Consciousness: awake, alert  and oriented  Airway and Oxygen Therapy: Patient Spontanous Breathing  Post-op Pain: none  Post-op Assessment: Post-op Vital signs reviewed, Patient's Cardiovascular Status Stable, Respiratory Function Stable, Patent Airway and No signs of Nausea or vomiting  Post-op Vital Signs: Reviewed and stable  Last Vitals:  Filed Vitals:   07/26/13 1059  BP: 117/68  Pulse:   Temp:   Resp: 15    Complications: No apparent anesthesia complications

## 2013-07-26 NOTE — Anesthesia Preprocedure Evaluation (Signed)
Anesthesia Evaluation  Patient identified by MRN, date of birth, ID band Patient awake    Reviewed: Allergy & Precautions, H&P , NPO status , Patient's Chart, lab work & pertinent test results  Airway Mallampati: III TM Distance: <3 FB     Dental  (+) Edentulous Upper, Edentulous Lower   Pulmonary former smoker,  breath sounds clear to auscultation        Cardiovascular hypertension, Pt. on medications Rhythm:Regular Rate:Normal     Neuro/Psych Anxiety    GI/Hepatic GERD-  ,  Endo/Other  Hypothyroidism   Renal/GU Renal disease     Musculoskeletal   Abdominal   Peds  Hematology  (+) anemia ,   Anesthesia Other Findings   Reproductive/Obstetrics                           Anesthesia Physical Anesthesia Plan  ASA: III  Anesthesia Plan: MAC   Post-op Pain Management:    Induction: Intravenous  Airway Management Planned: Nasal Cannula  Additional Equipment:   Intra-op Plan:   Post-operative Plan:   Informed Consent: I have reviewed the patients History and Physical, chart, labs and discussed the procedure including the risks, benefits and alternatives for the proposed anesthesia with the patient or authorized representative who has indicated his/her understanding and acceptance.     Plan Discussed with:   Anesthesia Plan Comments:         Anesthesia Quick Evaluation

## 2013-07-26 NOTE — Transfer of Care (Signed)
Immediate Anesthesia Transfer of Care Note  Patient: Christine Hancock  Procedure(s) Performed: Procedure(s) with comments: CATARACT EXTRACTION PHACO AND INTRAOCULAR LENS PLACEMENT LEFT EYE (Left) - CDE: 11.76  Patient Location: Short Stay  Anesthesia Type:MAC  Level of Consciousness: awake  Airway & Oxygen Therapy: Patient Spontanous Breathing  Post-op Assessment: Report given to PACU RN  Post vital signs: Reviewed  Complications: No apparent anesthesia complications

## 2013-07-26 NOTE — H&P (Signed)
I have reviewed the H&P, the patient was re-examined, and I have identified no interval changes in medical condition and plan of care since the history and physical of record  

## 2013-07-26 NOTE — Discharge Instructions (Signed)

## 2013-07-26 NOTE — Op Note (Signed)
Date of Admission: 07/26/2013  Date of Surgery: 07/26/2013   Pre-Op Dx: Cataract Left Eye  Post-Op Dx: Cataract Left  Eye,  Dx Code 366.19  Surgeon: Tonny Branch, M.D.  Assistants: None  Anesthesia: Topical with MAC  Indications: Painless, progressive loss of vision with compromise of daily activities.  Surgery: Cataract Extraction with Intraocular lens Implant Left Eye  Discription: The patient had dilating drops and viscous lidocaine placed into the Left eye in the pre-op holding area. After transfer to the operating room, a time out was performed. The patient was then prepped and draped. Beginning with a 75 degree blade a paracentesis port was made at the surgeon's 2 o'clock position. The anterior chamber was then filled with 1% non-preserved lidocaine. This was followed by filling the anterior chamber with Provisc.  A 2.16mm keratome blade was used to make a clear corneal incision at the temporal limbus.  A bent cystatome needle was used to create a continuous tear capsulotomy. Hydrodissection was performed with balanced salt solution on a Fine canula. The lens nucleus was then removed using the phacoemulsification handpiece. Residual cortex was removed with the I&A handpiece. The anterior chamber and capsular bag were refilled with Provisc. A posterior chamber intraocular lens was placed into the capsular bag with it's injector. The implant was positioned with the Kuglan hook. The Provisc was then removed from the anterior chamber and capsular bag with the I&A handpiece. Stromal hydration of the main incision and paracentesis port was performed with BSS on a Fine canula. The wounds were tested for leak which was negative. The patient tolerated the procedure well. There were no operative complications. The patient was then transferred to the recovery room in stable condition.  Complications: None  Specimen: None  EBL: None  Prosthetic device: Hoya iSert 250, power 22.0 D, SN [id number].

## 2013-07-27 ENCOUNTER — Encounter (HOSPITAL_COMMUNITY): Payer: Self-pay | Admitting: Ophthalmology

## 2013-10-09 DIAGNOSIS — I739 Peripheral vascular disease, unspecified: Secondary | ICD-10-CM | POA: Diagnosis not present

## 2013-10-10 DIAGNOSIS — K2100 Gastro-esophageal reflux disease with esophagitis, without bleeding: Secondary | ICD-10-CM | POA: Diagnosis not present

## 2013-10-10 DIAGNOSIS — M1990 Unspecified osteoarthritis, unspecified site: Secondary | ICD-10-CM | POA: Diagnosis not present

## 2013-10-10 DIAGNOSIS — J209 Acute bronchitis, unspecified: Secondary | ICD-10-CM | POA: Diagnosis not present

## 2013-10-10 DIAGNOSIS — I10 Essential (primary) hypertension: Secondary | ICD-10-CM | POA: Diagnosis not present

## 2013-11-29 DIAGNOSIS — Z23 Encounter for immunization: Secondary | ICD-10-CM | POA: Diagnosis not present

## 2013-12-25 DIAGNOSIS — I739 Peripheral vascular disease, unspecified: Secondary | ICD-10-CM | POA: Diagnosis not present

## 2014-01-08 DIAGNOSIS — F419 Anxiety disorder, unspecified: Secondary | ICD-10-CM | POA: Diagnosis not present

## 2014-01-08 DIAGNOSIS — G64 Other disorders of peripheral nervous system: Secondary | ICD-10-CM | POA: Diagnosis not present

## 2014-01-08 DIAGNOSIS — K5900 Constipation, unspecified: Secondary | ICD-10-CM | POA: Diagnosis not present

## 2014-01-08 DIAGNOSIS — I10 Essential (primary) hypertension: Secondary | ICD-10-CM | POA: Diagnosis not present

## 2014-02-01 DIAGNOSIS — H04123 Dry eye syndrome of bilateral lacrimal glands: Secondary | ICD-10-CM | POA: Diagnosis not present

## 2014-02-28 SURGERY — Surgical Case
Anesthesia: *Unknown

## 2014-03-07 DIAGNOSIS — H04123 Dry eye syndrome of bilateral lacrimal glands: Secondary | ICD-10-CM | POA: Diagnosis not present

## 2014-03-12 DIAGNOSIS — I739 Peripheral vascular disease, unspecified: Secondary | ICD-10-CM | POA: Diagnosis not present

## 2014-04-02 DIAGNOSIS — H04123 Dry eye syndrome of bilateral lacrimal glands: Secondary | ICD-10-CM | POA: Diagnosis not present

## 2014-04-18 DIAGNOSIS — H16223 Keratoconjunctivitis sicca, not specified as Sjogren's, bilateral: Secondary | ICD-10-CM | POA: Diagnosis not present

## 2014-04-18 DIAGNOSIS — H04123 Dry eye syndrome of bilateral lacrimal glands: Secondary | ICD-10-CM | POA: Diagnosis not present

## 2014-05-14 DIAGNOSIS — I10 Essential (primary) hypertension: Secondary | ICD-10-CM | POA: Diagnosis not present

## 2014-05-14 DIAGNOSIS — R0602 Shortness of breath: Secondary | ICD-10-CM | POA: Diagnosis not present

## 2014-05-14 DIAGNOSIS — E039 Hypothyroidism, unspecified: Secondary | ICD-10-CM | POA: Diagnosis not present

## 2014-05-14 DIAGNOSIS — E785 Hyperlipidemia, unspecified: Secondary | ICD-10-CM | POA: Diagnosis not present

## 2014-05-21 DIAGNOSIS — I739 Peripheral vascular disease, unspecified: Secondary | ICD-10-CM | POA: Diagnosis not present

## 2014-05-30 DIAGNOSIS — R0682 Tachypnea, not elsewhere classified: Secondary | ICD-10-CM | POA: Diagnosis not present

## 2014-05-30 DIAGNOSIS — E785 Hyperlipidemia, unspecified: Secondary | ICD-10-CM | POA: Diagnosis not present

## 2014-05-30 DIAGNOSIS — E039 Hypothyroidism, unspecified: Secondary | ICD-10-CM | POA: Diagnosis not present

## 2014-05-30 DIAGNOSIS — R0602 Shortness of breath: Secondary | ICD-10-CM | POA: Diagnosis not present

## 2014-05-30 DIAGNOSIS — I10 Essential (primary) hypertension: Secondary | ICD-10-CM | POA: Diagnosis not present

## 2014-05-30 DIAGNOSIS — J449 Chronic obstructive pulmonary disease, unspecified: Secondary | ICD-10-CM | POA: Diagnosis not present

## 2014-08-06 DIAGNOSIS — I739 Peripheral vascular disease, unspecified: Secondary | ICD-10-CM | POA: Diagnosis not present

## 2014-08-22 DIAGNOSIS — H26493 Other secondary cataract, bilateral: Secondary | ICD-10-CM | POA: Diagnosis not present

## 2014-08-22 DIAGNOSIS — Z961 Presence of intraocular lens: Secondary | ICD-10-CM | POA: Diagnosis not present

## 2014-09-17 DIAGNOSIS — F419 Anxiety disorder, unspecified: Secondary | ICD-10-CM | POA: Diagnosis not present

## 2014-09-17 DIAGNOSIS — G2581 Restless legs syndrome: Secondary | ICD-10-CM | POA: Diagnosis not present

## 2014-09-17 DIAGNOSIS — I10 Essential (primary) hypertension: Secondary | ICD-10-CM | POA: Diagnosis not present

## 2014-09-17 DIAGNOSIS — K5900 Constipation, unspecified: Secondary | ICD-10-CM | POA: Diagnosis not present

## 2014-09-19 DIAGNOSIS — H16213 Exposure keratoconjunctivitis, bilateral: Secondary | ICD-10-CM | POA: Diagnosis not present

## 2014-09-19 DIAGNOSIS — H16223 Keratoconjunctivitis sicca, not specified as Sjogren's, bilateral: Secondary | ICD-10-CM | POA: Diagnosis not present

## 2014-10-22 DIAGNOSIS — I739 Peripheral vascular disease, unspecified: Secondary | ICD-10-CM | POA: Diagnosis not present

## 2014-12-12 DIAGNOSIS — Z23 Encounter for immunization: Secondary | ICD-10-CM | POA: Diagnosis not present

## 2014-12-19 DIAGNOSIS — H16213 Exposure keratoconjunctivitis, bilateral: Secondary | ICD-10-CM | POA: Diagnosis not present

## 2014-12-19 DIAGNOSIS — H16223 Keratoconjunctivitis sicca, not specified as Sjogren's, bilateral: Secondary | ICD-10-CM | POA: Diagnosis not present

## 2015-01-07 DIAGNOSIS — I739 Peripheral vascular disease, unspecified: Secondary | ICD-10-CM | POA: Diagnosis not present

## 2015-02-06 DIAGNOSIS — K5900 Constipation, unspecified: Secondary | ICD-10-CM | POA: Diagnosis not present

## 2015-02-06 DIAGNOSIS — N3281 Overactive bladder: Secondary | ICD-10-CM | POA: Diagnosis not present

## 2015-02-06 DIAGNOSIS — I10 Essential (primary) hypertension: Secondary | ICD-10-CM | POA: Diagnosis not present

## 2015-02-06 DIAGNOSIS — F419 Anxiety disorder, unspecified: Secondary | ICD-10-CM | POA: Diagnosis not present

## 2015-03-18 DIAGNOSIS — I739 Peripheral vascular disease, unspecified: Secondary | ICD-10-CM | POA: Diagnosis not present

## 2015-06-03 DIAGNOSIS — I739 Peripheral vascular disease, unspecified: Secondary | ICD-10-CM | POA: Diagnosis not present

## 2015-07-29 DIAGNOSIS — N3281 Overactive bladder: Secondary | ICD-10-CM | POA: Diagnosis not present

## 2015-07-29 DIAGNOSIS — I10 Essential (primary) hypertension: Secondary | ICD-10-CM | POA: Diagnosis not present

## 2015-07-29 DIAGNOSIS — K210 Gastro-esophageal reflux disease with esophagitis: Secondary | ICD-10-CM | POA: Diagnosis not present

## 2015-07-29 DIAGNOSIS — F419 Anxiety disorder, unspecified: Secondary | ICD-10-CM | POA: Diagnosis not present

## 2015-08-20 DIAGNOSIS — I739 Peripheral vascular disease, unspecified: Secondary | ICD-10-CM | POA: Diagnosis not present

## 2015-09-09 DIAGNOSIS — Z961 Presence of intraocular lens: Secondary | ICD-10-CM | POA: Diagnosis not present

## 2015-09-09 DIAGNOSIS — H04123 Dry eye syndrome of bilateral lacrimal glands: Secondary | ICD-10-CM | POA: Diagnosis not present

## 2015-09-09 DIAGNOSIS — H524 Presbyopia: Secondary | ICD-10-CM | POA: Diagnosis not present

## 2015-09-29 DIAGNOSIS — F419 Anxiety disorder, unspecified: Secondary | ICD-10-CM | POA: Diagnosis not present

## 2015-09-29 DIAGNOSIS — I10 Essential (primary) hypertension: Secondary | ICD-10-CM | POA: Diagnosis not present

## 2015-09-29 DIAGNOSIS — G64 Other disorders of peripheral nervous system: Secondary | ICD-10-CM | POA: Diagnosis not present

## 2015-09-29 DIAGNOSIS — E039 Hypothyroidism, unspecified: Secondary | ICD-10-CM | POA: Diagnosis not present

## 2015-10-15 DIAGNOSIS — H9191 Unspecified hearing loss, right ear: Secondary | ICD-10-CM | POA: Diagnosis not present

## 2015-10-15 DIAGNOSIS — H6121 Impacted cerumen, right ear: Secondary | ICD-10-CM | POA: Diagnosis not present

## 2015-10-29 DIAGNOSIS — K5900 Constipation, unspecified: Secondary | ICD-10-CM | POA: Diagnosis not present

## 2015-10-29 DIAGNOSIS — R1084 Generalized abdominal pain: Secondary | ICD-10-CM | POA: Diagnosis not present

## 2015-11-04 DIAGNOSIS — I739 Peripheral vascular disease, unspecified: Secondary | ICD-10-CM | POA: Diagnosis not present

## 2015-11-25 DIAGNOSIS — L600 Ingrowing nail: Secondary | ICD-10-CM | POA: Diagnosis not present

## 2015-11-25 DIAGNOSIS — M79671 Pain in right foot: Secondary | ICD-10-CM | POA: Diagnosis not present

## 2015-11-25 DIAGNOSIS — L03031 Cellulitis of right toe: Secondary | ICD-10-CM | POA: Diagnosis not present

## 2015-11-25 DIAGNOSIS — L03032 Cellulitis of left toe: Secondary | ICD-10-CM | POA: Diagnosis not present

## 2015-12-11 DIAGNOSIS — Z23 Encounter for immunization: Secondary | ICD-10-CM | POA: Diagnosis not present

## 2015-12-25 DIAGNOSIS — M79672 Pain in left foot: Secondary | ICD-10-CM | POA: Diagnosis not present

## 2015-12-25 DIAGNOSIS — L03032 Cellulitis of left toe: Secondary | ICD-10-CM | POA: Diagnosis not present

## 2015-12-25 DIAGNOSIS — L600 Ingrowing nail: Secondary | ICD-10-CM | POA: Diagnosis not present

## 2015-12-25 DIAGNOSIS — M79675 Pain in left toe(s): Secondary | ICD-10-CM | POA: Diagnosis not present

## 2016-01-12 DIAGNOSIS — I739 Peripheral vascular disease, unspecified: Secondary | ICD-10-CM | POA: Diagnosis not present

## 2016-01-13 DIAGNOSIS — R601 Generalized edema: Secondary | ICD-10-CM | POA: Diagnosis not present

## 2016-01-13 DIAGNOSIS — I10 Essential (primary) hypertension: Secondary | ICD-10-CM | POA: Diagnosis not present

## 2016-01-13 DIAGNOSIS — F419 Anxiety disorder, unspecified: Secondary | ICD-10-CM | POA: Diagnosis not present

## 2016-03-22 DIAGNOSIS — I739 Peripheral vascular disease, unspecified: Secondary | ICD-10-CM | POA: Diagnosis not present

## 2016-03-31 DIAGNOSIS — I10 Essential (primary) hypertension: Secondary | ICD-10-CM | POA: Diagnosis not present

## 2016-03-31 DIAGNOSIS — K5900 Constipation, unspecified: Secondary | ICD-10-CM | POA: Diagnosis not present

## 2016-03-31 DIAGNOSIS — F419 Anxiety disorder, unspecified: Secondary | ICD-10-CM | POA: Diagnosis not present

## 2016-03-31 DIAGNOSIS — R601 Generalized edema: Secondary | ICD-10-CM | POA: Diagnosis not present

## 2016-04-02 DIAGNOSIS — Z79899 Other long term (current) drug therapy: Secondary | ICD-10-CM | POA: Diagnosis not present

## 2016-04-02 DIAGNOSIS — K5900 Constipation, unspecified: Secondary | ICD-10-CM | POA: Diagnosis not present

## 2016-04-02 DIAGNOSIS — R7989 Other specified abnormal findings of blood chemistry: Secondary | ICD-10-CM | POA: Diagnosis not present

## 2016-04-02 DIAGNOSIS — M79605 Pain in left leg: Secondary | ICD-10-CM | POA: Diagnosis not present

## 2016-04-02 DIAGNOSIS — M79662 Pain in left lower leg: Secondary | ICD-10-CM | POA: Diagnosis not present

## 2016-04-02 DIAGNOSIS — R600 Localized edema: Secondary | ICD-10-CM | POA: Diagnosis not present

## 2016-04-03 DIAGNOSIS — R600 Localized edema: Secondary | ICD-10-CM | POA: Diagnosis not present

## 2016-04-03 DIAGNOSIS — M79605 Pain in left leg: Secondary | ICD-10-CM | POA: Diagnosis not present

## 2016-04-04 DIAGNOSIS — M79605 Pain in left leg: Secondary | ICD-10-CM | POA: Diagnosis not present

## 2016-04-04 DIAGNOSIS — R600 Localized edema: Secondary | ICD-10-CM | POA: Diagnosis not present

## 2016-04-05 DIAGNOSIS — I517 Cardiomegaly: Secondary | ICD-10-CM | POA: Diagnosis not present

## 2016-04-05 DIAGNOSIS — M79605 Pain in left leg: Secondary | ICD-10-CM | POA: Diagnosis not present

## 2016-04-05 DIAGNOSIS — R600 Localized edema: Secondary | ICD-10-CM | POA: Diagnosis not present

## 2016-04-06 DIAGNOSIS — M79605 Pain in left leg: Secondary | ICD-10-CM | POA: Diagnosis not present

## 2016-04-06 DIAGNOSIS — R600 Localized edema: Secondary | ICD-10-CM | POA: Diagnosis not present

## 2016-04-07 DIAGNOSIS — M79605 Pain in left leg: Secondary | ICD-10-CM | POA: Diagnosis not present

## 2016-04-07 DIAGNOSIS — R600 Localized edema: Secondary | ICD-10-CM | POA: Diagnosis not present

## 2016-04-12 DIAGNOSIS — F29 Unspecified psychosis not due to a substance or known physiological condition: Secondary | ICD-10-CM | POA: Diagnosis not present

## 2016-04-12 DIAGNOSIS — I10 Essential (primary) hypertension: Secondary | ICD-10-CM | POA: Diagnosis not present

## 2016-04-12 DIAGNOSIS — M6281 Muscle weakness (generalized): Secondary | ICD-10-CM | POA: Diagnosis not present

## 2016-04-12 DIAGNOSIS — Z8672 Personal history of thrombophlebitis: Secondary | ICD-10-CM | POA: Diagnosis not present

## 2016-04-12 DIAGNOSIS — N179 Acute kidney failure, unspecified: Secondary | ICD-10-CM | POA: Diagnosis not present

## 2016-04-12 DIAGNOSIS — Z9181 History of falling: Secondary | ICD-10-CM | POA: Diagnosis not present

## 2016-04-12 DIAGNOSIS — R262 Difficulty in walking, not elsewhere classified: Secondary | ICD-10-CM | POA: Diagnosis not present

## 2016-04-12 DIAGNOSIS — G5790 Unspecified mononeuropathy of unspecified lower limb: Secondary | ICD-10-CM | POA: Diagnosis not present

## 2016-04-12 DIAGNOSIS — R791 Abnormal coagulation profile: Secondary | ICD-10-CM | POA: Diagnosis not present

## 2016-04-15 DIAGNOSIS — R262 Difficulty in walking, not elsewhere classified: Secondary | ICD-10-CM | POA: Diagnosis not present

## 2016-04-15 DIAGNOSIS — I10 Essential (primary) hypertension: Secondary | ICD-10-CM | POA: Diagnosis not present

## 2016-04-15 DIAGNOSIS — F29 Unspecified psychosis not due to a substance or known physiological condition: Secondary | ICD-10-CM | POA: Diagnosis not present

## 2016-04-15 DIAGNOSIS — M6281 Muscle weakness (generalized): Secondary | ICD-10-CM | POA: Diagnosis not present

## 2016-04-15 DIAGNOSIS — R791 Abnormal coagulation profile: Secondary | ICD-10-CM | POA: Diagnosis not present

## 2016-04-15 DIAGNOSIS — G5790 Unspecified mononeuropathy of unspecified lower limb: Secondary | ICD-10-CM | POA: Diagnosis not present

## 2016-04-19 DIAGNOSIS — R601 Generalized edema: Secondary | ICD-10-CM | POA: Diagnosis not present

## 2016-04-19 DIAGNOSIS — I10 Essential (primary) hypertension: Secondary | ICD-10-CM | POA: Diagnosis not present

## 2016-04-19 DIAGNOSIS — F419 Anxiety disorder, unspecified: Secondary | ICD-10-CM | POA: Diagnosis not present

## 2016-04-19 DIAGNOSIS — E039 Hypothyroidism, unspecified: Secondary | ICD-10-CM | POA: Diagnosis not present

## 2016-04-20 DIAGNOSIS — M6281 Muscle weakness (generalized): Secondary | ICD-10-CM | POA: Diagnosis not present

## 2016-04-20 DIAGNOSIS — I10 Essential (primary) hypertension: Secondary | ICD-10-CM | POA: Diagnosis not present

## 2016-04-20 DIAGNOSIS — R791 Abnormal coagulation profile: Secondary | ICD-10-CM | POA: Diagnosis not present

## 2016-04-20 DIAGNOSIS — R262 Difficulty in walking, not elsewhere classified: Secondary | ICD-10-CM | POA: Diagnosis not present

## 2016-04-20 DIAGNOSIS — F29 Unspecified psychosis not due to a substance or known physiological condition: Secondary | ICD-10-CM | POA: Diagnosis not present

## 2016-04-20 DIAGNOSIS — G5790 Unspecified mononeuropathy of unspecified lower limb: Secondary | ICD-10-CM | POA: Diagnosis not present

## 2016-04-20 DIAGNOSIS — R600 Localized edema: Secondary | ICD-10-CM | POA: Diagnosis not present

## 2016-04-22 DIAGNOSIS — R791 Abnormal coagulation profile: Secondary | ICD-10-CM | POA: Diagnosis not present

## 2016-04-22 DIAGNOSIS — F29 Unspecified psychosis not due to a substance or known physiological condition: Secondary | ICD-10-CM | POA: Diagnosis not present

## 2016-04-22 DIAGNOSIS — G5790 Unspecified mononeuropathy of unspecified lower limb: Secondary | ICD-10-CM | POA: Diagnosis not present

## 2016-04-22 DIAGNOSIS — I10 Essential (primary) hypertension: Secondary | ICD-10-CM | POA: Diagnosis not present

## 2016-04-22 DIAGNOSIS — R262 Difficulty in walking, not elsewhere classified: Secondary | ICD-10-CM | POA: Diagnosis not present

## 2016-04-22 DIAGNOSIS — M6281 Muscle weakness (generalized): Secondary | ICD-10-CM | POA: Diagnosis not present

## 2016-04-27 DIAGNOSIS — M6281 Muscle weakness (generalized): Secondary | ICD-10-CM | POA: Diagnosis not present

## 2016-04-27 DIAGNOSIS — G5790 Unspecified mononeuropathy of unspecified lower limb: Secondary | ICD-10-CM | POA: Diagnosis not present

## 2016-04-27 DIAGNOSIS — F29 Unspecified psychosis not due to a substance or known physiological condition: Secondary | ICD-10-CM | POA: Diagnosis not present

## 2016-04-27 DIAGNOSIS — R262 Difficulty in walking, not elsewhere classified: Secondary | ICD-10-CM | POA: Diagnosis not present

## 2016-04-27 DIAGNOSIS — R791 Abnormal coagulation profile: Secondary | ICD-10-CM | POA: Diagnosis not present

## 2016-04-27 DIAGNOSIS — I10 Essential (primary) hypertension: Secondary | ICD-10-CM | POA: Diagnosis not present

## 2016-04-29 DIAGNOSIS — R262 Difficulty in walking, not elsewhere classified: Secondary | ICD-10-CM | POA: Diagnosis not present

## 2016-04-29 DIAGNOSIS — I10 Essential (primary) hypertension: Secondary | ICD-10-CM | POA: Diagnosis not present

## 2016-04-29 DIAGNOSIS — G5790 Unspecified mononeuropathy of unspecified lower limb: Secondary | ICD-10-CM | POA: Diagnosis not present

## 2016-04-29 DIAGNOSIS — M6281 Muscle weakness (generalized): Secondary | ICD-10-CM | POA: Diagnosis not present

## 2016-04-29 DIAGNOSIS — F29 Unspecified psychosis not due to a substance or known physiological condition: Secondary | ICD-10-CM | POA: Diagnosis not present

## 2016-04-29 DIAGNOSIS — R791 Abnormal coagulation profile: Secondary | ICD-10-CM | POA: Diagnosis not present

## 2016-05-05 DIAGNOSIS — R791 Abnormal coagulation profile: Secondary | ICD-10-CM | POA: Diagnosis not present

## 2016-05-05 DIAGNOSIS — I10 Essential (primary) hypertension: Secondary | ICD-10-CM | POA: Diagnosis not present

## 2016-05-05 DIAGNOSIS — R262 Difficulty in walking, not elsewhere classified: Secondary | ICD-10-CM | POA: Diagnosis not present

## 2016-05-05 DIAGNOSIS — M6281 Muscle weakness (generalized): Secondary | ICD-10-CM | POA: Diagnosis not present

## 2016-05-05 DIAGNOSIS — F29 Unspecified psychosis not due to a substance or known physiological condition: Secondary | ICD-10-CM | POA: Diagnosis not present

## 2016-05-05 DIAGNOSIS — G5790 Unspecified mononeuropathy of unspecified lower limb: Secondary | ICD-10-CM | POA: Diagnosis not present

## 2016-05-31 DIAGNOSIS — I739 Peripheral vascular disease, unspecified: Secondary | ICD-10-CM | POA: Diagnosis not present

## 2016-06-01 DIAGNOSIS — Z961 Presence of intraocular lens: Secondary | ICD-10-CM | POA: Diagnosis not present

## 2016-06-01 DIAGNOSIS — H10503 Unspecified blepharoconjunctivitis, bilateral: Secondary | ICD-10-CM | POA: Diagnosis not present

## 2016-06-01 DIAGNOSIS — H00029 Hordeolum internum unspecified eye, unspecified eyelid: Secondary | ICD-10-CM | POA: Diagnosis not present

## 2016-07-05 DIAGNOSIS — Z87891 Personal history of nicotine dependence: Secondary | ICD-10-CM | POA: Diagnosis not present

## 2016-07-05 DIAGNOSIS — I82411 Acute embolism and thrombosis of right femoral vein: Secondary | ICD-10-CM | POA: Diagnosis not present

## 2016-07-05 DIAGNOSIS — Z79899 Other long term (current) drug therapy: Secondary | ICD-10-CM | POA: Diagnosis not present

## 2016-07-05 DIAGNOSIS — M170 Bilateral primary osteoarthritis of knee: Secondary | ICD-10-CM | POA: Diagnosis not present

## 2016-07-05 DIAGNOSIS — M79671 Pain in right foot: Secondary | ICD-10-CM | POA: Diagnosis not present

## 2016-07-05 DIAGNOSIS — I82811 Embolism and thrombosis of superficial veins of right lower extremities: Secondary | ICD-10-CM | POA: Diagnosis not present

## 2016-07-05 DIAGNOSIS — I10 Essential (primary) hypertension: Secondary | ICD-10-CM | POA: Diagnosis not present

## 2016-07-05 DIAGNOSIS — Z86718 Personal history of other venous thrombosis and embolism: Secondary | ICD-10-CM | POA: Diagnosis not present

## 2016-07-05 DIAGNOSIS — K219 Gastro-esophageal reflux disease without esophagitis: Secondary | ICD-10-CM | POA: Diagnosis not present

## 2016-07-05 DIAGNOSIS — E785 Hyperlipidemia, unspecified: Secondary | ICD-10-CM | POA: Diagnosis not present

## 2016-07-05 DIAGNOSIS — R9431 Abnormal electrocardiogram [ECG] [EKG]: Secondary | ICD-10-CM | POA: Diagnosis not present

## 2016-07-05 DIAGNOSIS — R05 Cough: Secondary | ICD-10-CM | POA: Diagnosis not present

## 2016-07-05 DIAGNOSIS — I447 Left bundle-branch block, unspecified: Secondary | ICD-10-CM | POA: Diagnosis not present

## 2016-07-05 DIAGNOSIS — E039 Hypothyroidism, unspecified: Secondary | ICD-10-CM | POA: Diagnosis not present

## 2016-07-05 DIAGNOSIS — K5900 Constipation, unspecified: Secondary | ICD-10-CM | POA: Diagnosis not present

## 2016-07-05 DIAGNOSIS — F29 Unspecified psychosis not due to a substance or known physiological condition: Secondary | ICD-10-CM | POA: Diagnosis not present

## 2016-07-06 DIAGNOSIS — I82411 Acute embolism and thrombosis of right femoral vein: Secondary | ICD-10-CM | POA: Diagnosis not present

## 2016-07-21 DIAGNOSIS — F419 Anxiety disorder, unspecified: Secondary | ICD-10-CM | POA: Diagnosis not present

## 2016-07-21 DIAGNOSIS — I10 Essential (primary) hypertension: Secondary | ICD-10-CM | POA: Diagnosis not present

## 2016-07-21 DIAGNOSIS — I82409 Acute embolism and thrombosis of unspecified deep veins of unspecified lower extremity: Secondary | ICD-10-CM | POA: Diagnosis not present

## 2016-07-21 DIAGNOSIS — G64 Other disorders of peripheral nervous system: Secondary | ICD-10-CM | POA: Diagnosis not present

## 2016-07-28 DIAGNOSIS — L600 Ingrowing nail: Secondary | ICD-10-CM | POA: Diagnosis not present

## 2016-07-28 DIAGNOSIS — M79675 Pain in left toe(s): Secondary | ICD-10-CM | POA: Diagnosis not present

## 2016-07-28 DIAGNOSIS — I739 Peripheral vascular disease, unspecified: Secondary | ICD-10-CM | POA: Diagnosis not present

## 2016-08-30 DIAGNOSIS — I739 Peripheral vascular disease, unspecified: Secondary | ICD-10-CM | POA: Diagnosis not present

## 2016-09-15 ENCOUNTER — Ambulatory Visit (HOSPITAL_COMMUNITY): Payer: Medicare Other

## 2016-09-22 NOTE — Progress Notes (Incomplete)
[place], [place] [address]   CLINIC:  Medical Oncology/Hematology  REASON FOR CONSULTATION: Recurrent DVT    REFERRAL FROM:  Dr. Jearl Klinefelter ([place]) [phone]  PRIMARY CARE PROVIDER: Sinda Du, [place] [address] [phone]    HISTORY OF PRESENT ILLNESS:  Ms. Christine COBAUGH 82 y.o. 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  She thinks she was "put on a pill" for the blood clot at that time, but cannot recall the name.  Most recent DVT occurred in 06/2016 to (R) leg; diagnosed at [facility] per patient (we do not have complete records available for review at the time of this consultation; records reviewed post-visit). States that she did not have any surgery prior to right lower extremity DVT.  She was started on loading dose Eliquis and now is on treatment dose of Eliquis 5 mg po BID.  Current resident at [facility] in [place].    Denies any history of hormone-replacement therapy.  No recent surgeries or fractures/immobilization devices. No recent long travel. No current tobacco use. No family history of thromboembolic events that she is aware of.  No family history of malignancy as well.    History limited as patient has difficulty remembering details of her medical history.    Addendum:  -Records from [facility]. Patient was started on Lovenox in 06/2016 at time of (R) LE DVT. Transitioned to Eliquis.    INTERVAL HISTORY:  Presents today for consultation for recurrent lower extremity DVTs, most recent occurring in 06/2016 and diagnosed at [facility]. Denies any LE pain or edema.  Denies any cough, shortness of breath, or chest pain.  Appetite and energy levels 100%.  Denies any frank bleeding episodes including blood in her stools, dark/tarry stools, hematuria, vaginal  bleeding, nosebleeds, or gingival bleeding.  Remains on Eliquis (per [facility] medication record) at 5 mg BID.   Her biggest concerns today are intermittent ulcerations to her tongue and gums from her dentures.  She also expresses concerns about her cholesterol and if she should stop eating powdered eggs. Encouraged her to discuss these concerns with Dr. Luan Pulling, her PCP, as we do not have cholesterol laboratory results. She may also need referral back to dentist for what may be ill-fitting dentures.   Otherwise, she is largely without complaints today.    REVIEW OF SYSTEMS: Review of Systems  Constitutional: Negative.  Negative for chills, fatigue and fever.  HENT:   Positive for mouth sores. Negative for lump/mass and nosebleeds.   Eyes: Negative.   Respiratory: Negative.  Negative for cough and shortness of breath.   Cardiovascular: Negative.  Negative for chest pain and leg swelling.  Gastrointestinal: Negative.  Negative for abdominal pain, blood in stool, constipation, diarrhea, nausea and vomiting.  Endocrine: Negative.   Genitourinary: Negative.  Negative for dysuria and hematuria.   Musculoskeletal: Negative.  Negative for arthralgias.  Skin: Negative.  Negative for rash.  Neurological: Negative.  Negative for dizziness and headaches.  Hematological: Negative.  Negative for adenopathy. Does not bruise/bleed easily.  Psychiatric/Behavioral: Negative for depression and sleep disturbance. The patient is nervous/anxious.      PAST MEDICAL & SURGICAL HISTORY:  Past Medical History:  Diagnosis Date  . Anxiety   . GERD (gastroesophageal reflux disease)   . Hypercholesterolemia   . Hypotension   . Hypothyroidism   .  Phlebitis    Past Surgical History:  Procedure Laterality Date  . ABDOMINAL HYSTERECTOMY     partial  . CATARACT EXTRACTION W/PHACO Right 06/28/2013   Procedure: CATARACT EXTRACTION PHACO AND INTRAOCULAR LENS PLACEMENT (IOC);  Surgeon: Tonny Branch, MD;  Location: [facility];  Service: Ophthalmology;  Laterality: Right;  CDE:13.79  . CATARACT EXTRACTION W/PHACO Left 07/26/2013   Procedure: CATARACT EXTRACTION PHACO AND INTRAOCULAR LENS PLACEMENT LEFT EYE;  Surgeon: Tonny Branch, MD;  Location: [facility];  Service: Ophthalmology;  Laterality: Left;  CDE: 11.76  . CHOLECYSTECTOMY    . ESOPHAGEAL DILATION    . TONSILLECTOMY       SOCIAL HISTORY:  -Widowed; was married to her husband from 67 until his death in 92.   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  She is originally from [facility], but lived in [place] with her husband for several years for his job.  -No children -She is the 3rd oldest of 9 children.  1 sister and 1 brother are currently alive and well, both in their 82's.  Her mother died at the age of 76. Her father died at 27 of heart disease.  She is aware that 1 sister died in her 1's of heart disease well, but is not sure how her other siblings died.   -No family history of cancer that she is aware.  -No family history of blood disorders that she is aware.  -Former smoker; no current tobacco use.   Social History   Social History  . Marital status: Widowed    Spouse name: N/A  . Number of children: 0  . Years of education: N/A   Occupational History  . retired Automotive engineer Retired   Social History Main Topics  . Smoking status: Former Smoker    Packs/day: 0.25    Years: 15.00    Types: Cigarettes    Quit date: 10/22/2005  . Smokeless tobacco: Never Used  . Alcohol use No  . Drug use: No  . Sexual activity: No   Other Topics Concern  . Not on file   Social History Narrative   Moved from [place] 27 yrs ago.  Lives w/ brother.     CURRENT MEDICATIONS:  Current Outpatient Prescriptions on File Prior to Visit  Medication Sig Dispense Refill  . levothyroxine (SYNTHROID, LEVOTHROID) 88 MCG tablet Take 88 mcg by mouth daily before breakfast.    . NIFEdipine (PROCARDIA-XL/ADALAT CC) 30  MG 24 hr tablet Take 30 mg by mouth daily.    Marland Kitchen omeprazole (PRILOSEC) 20 MG capsule Take 20 mg by mouth daily.    . risperiDONE (RISPERDAL) 0.25 MG tablet Take 0.25 mg by mouth daily.    . risperiDONE (RISPERDAL) 0.5 MG tablet Take 0.5 mg by mouth at bedtime.     Marland Kitchen rOPINIRole (REQUIP) 0.5 MG tablet Take 0.5 mg by mouth at bedtime.     . traZODone (DESYREL) 50 MG tablet Take 25 mg by mouth at bedtime.     No current facility-administered medications on file prior to visit.      ALLERGIES: No Known Allergies   PERFORMANCE STATUS: 2-3 - Resides at [facility]; requires assistance with bathing. Is able to feed and dress herself.  Ambulates with walker.    PHYSICAL EXAM:  Vitals:   09/23/16 0853  BP: (!) 121/59  Pulse: 92  Resp: 20   Filed Weights   09/23/16 0853  Weight: 173 lb (78.5 kg)  Physical Exam  Constitutional: She is oriented to person, place, and time and well-developed, well-nourished, and in no distress.  HENT:  Head: Normocephalic.  Mouth/Throat: Oropharynx is clear and moist. No oropharyngeal exudate.  Eyes: Pupils are equal, round, and reactive to light. Conjunctivae are normal. No scleral icterus.  Neck: Normal range of motion. Neck supple.  Cardiovascular: Normal rate and regular rhythm.   Pulmonary/Chest: Effort normal and breath sounds normal. No respiratory distress. She has no wheezes.  Abdominal: Soft. Bowel sounds are normal. There is no tenderness. There is no rebound.  Musculoskeletal: Normal range of motion. She exhibits edema (Trace ankle edema bilaterally; left > right.  No calf pain or edema ).  Lymphadenopathy:    She has no cervical adenopathy.       Right: No supraclavicular adenopathy present.       Left: No supraclavicular adenopathy present.  Neurological: She is alert and oriented to person, place, and time. No cranial nerve deficit. Gait normal.  Steady gait with walker   Skin: Skin is warm and dry. No rash noted.    Psychiatric: Mood, memory, affect and judgment normal.  Nursing note and vitals reviewed.      LABORATORY DATA:   DIAGNOSTIC IMAGING:  (L) LE venous ultrasound: 12/22/09         ASSESSMENT & PLAN:   Recurrent DVTs:  -Appears to have had provoked (L) LE DVT in 2011 after a surgery; treatment with oral anticoagulation per patient (unclear what medication was used).  Most recent (R) LE DVT in 03/2016 appears to be unprovoked, but certainly her immobility puts her at high risk.  Started on Eliquis and remains on 5 mg BID at this time (per medication record at [facility]).  She is largely asymptomatic from DVT with no complaints of pain and only trace ankle edema bilaterally.  -No known family history of malignancy or hypercoagulable disorders.   -Tolerating Eliquis well with no reported bleeding episodes.   -Discussed with Dr. Talbert Cage. Given patient's age and performance status, we will forgo any additional hypercoagulable or occult malignancy work-up as it will not change our recommendations for management of recurrent DVTs at this time.   -Recommend lifelong anticoagulation with Eliquis 5 mg po BID given recurrent DVT.  Stressed the importance of falls precautions and bleeding precautions with patient.  She is at high-risk for falls which may cause serious injury on anticoagulation. However, her gait is steady with walker and she remains somewhat mobile. Continue anticoagulation as tolerated.   -No follow-up is necessary, unless there are further questions or concerns.  Plan of care discussed with patient and she agrees with plan.  Written recommendations provided to [facility] facility at time of patient discharge from clinic today.         Dispo:  -Continue lifelong anticoagulation with Eliquis 5 mg po BID.  -Return to cancer center as needed; no additional follow-up is necessary at this time.    -This patient was seen & evaluated by Dr. Twana First, who formulated the above plan  of care. This was a shared visit consultation.  Please see her attestation documentation for additional details.    A total of 30 minutes was spent in face-to-face care of this patient, with greater than 50% of that time spent in counseling and care coordination.    Mike Craze, NP [place] [phone]

## 2016-09-23 ENCOUNTER — Encounter (HOSPITAL_COMMUNITY): Payer: Self-pay | Admitting: Adult Health

## 2016-09-23 ENCOUNTER — Encounter (HOSPITAL_COMMUNITY): Payer: Medicare Other | Attending: Adult Health | Admitting: Adult Health

## 2016-09-23 VITALS — BP 121/59 | HR 92 | Resp 20 | Ht 62.0 in | Wt 173.0 lb

## 2016-09-23 DIAGNOSIS — I82403 Acute embolism and thrombosis of unspecified deep veins of lower extremity, bilateral: Secondary | ICD-10-CM

## 2016-09-23 NOTE — Patient Instructions (Signed)
[facility] at [facility] Discharge Instructions  RECOMMENDATIONS MADE BY THE CONSULTANT AND ANY TEST RESULTS WILL BE SENT TO YOUR REFERRING PHYSICIAN.  You were seen today by Gretchen Dawson NP.   Thank you for choosing [facility] at [facility] to provide your oncology and hematology care.  To afford each patient quality time with our provider, please arrive at least 15 minutes before your scheduled appointment time.    If you have a lab appointment with the Cancer Center please come in thru the  Main Entrance and check in at the main information desk  You need to re-schedule your appointment should you arrive 10 or more minutes late.  We strive to give you quality time with our providers, and arriving late affects you and other patients whose appointments are after yours.  Also, if you no show three or more times for appointments you may be dismissed from the clinic at the providers discretion.     Again, thank you for choosing [facility].  Our hope is that these requests will decrease the amount of time that you wait before being seen by our physicians.       _____________________________________________________________  Should you have questions after your visit to [facility], please contact our office at [phone] between the hours of 8:30 a.m. and 4:30 p.m.  Voicemails left after 4:30 p.m. will not be returned until the following business day.  For prescription refill requests, have your pharmacy contact our office.       Resources For Cancer Patients and their Caregivers ? American Cancer Society: Can assist with transportation, wigs, general needs, runs Look Good Feel Better.        1-888-227-6333 ? Cancer Care: Provides financial assistance, online support groups, medication/co-pay assistance.  1-800-813-HOPE (4673) ? [facility] Assists [place] cancer patients and  their families through emotional , educational and financial support.  336-427-4357 ? [place] DSS Where to apply for food stamps, Medicaid and utility assistance. 336-342-1394 ? [facility]: Transportation to medical appointments. 336-347-2287 ? Social Security Administration: May apply for disability if have a Stage IV cancer. 336-342-7796 1-800-772-1213 ? [place] Aging, Disability and Transit Services: Assists with nutrition, care and transit needs. 336-349-2343  Cancer Center Support Programs: @10RELATIVEDAYS@ > Cancer Support Group  2nd Tuesday of the month 1pm-2pm, Journey Room  > Creative Journey  3rd Tuesday of the month 1130am-1pm, Journey Room  > Look Good Feel Better  1st Wednesday of the month 10am-12 noon, Journey Room (Call American Cancer Society to register 1-800-395-5775)    

## 2016-10-20 DIAGNOSIS — R601 Generalized edema: Secondary | ICD-10-CM | POA: Diagnosis not present

## 2016-10-20 DIAGNOSIS — F419 Anxiety disorder, unspecified: Secondary | ICD-10-CM | POA: Diagnosis not present

## 2016-10-20 DIAGNOSIS — E039 Hypothyroidism, unspecified: Secondary | ICD-10-CM | POA: Diagnosis not present

## 2016-10-20 DIAGNOSIS — I10 Essential (primary) hypertension: Secondary | ICD-10-CM | POA: Diagnosis not present

## 2016-10-27 ENCOUNTER — Encounter (HOSPITAL_COMMUNITY): Payer: Self-pay | Admitting: Emergency Medicine

## 2016-10-27 ENCOUNTER — Emergency Department (HOSPITAL_COMMUNITY): Payer: Medicare Other

## 2016-10-27 ENCOUNTER — Observation Stay (HOSPITAL_COMMUNITY)
Admission: EM | Admit: 2016-10-27 | Discharge: 2016-10-30 | Disposition: A | Discharge status: Home / Self Care | Payer: Medicare Other | Attending: Pulmonary Disease | Admitting: Pulmonary Disease

## 2016-10-27 DIAGNOSIS — I5033 Acute on chronic diastolic (congestive) heart failure: Secondary | ICD-10-CM | POA: Diagnosis not present

## 2016-10-27 DIAGNOSIS — I5032 Chronic diastolic (congestive) heart failure: Secondary | ICD-10-CM | POA: Diagnosis not present

## 2016-10-27 DIAGNOSIS — R0682 Tachypnea, not elsewhere classified: Secondary | ICD-10-CM | POA: Diagnosis not present

## 2016-10-27 DIAGNOSIS — J9801 Acute bronchospasm: Secondary | ICD-10-CM

## 2016-10-27 DIAGNOSIS — E039 Hypothyroidism, unspecified: Secondary | ICD-10-CM | POA: Diagnosis not present

## 2016-10-27 DIAGNOSIS — F419 Anxiety disorder, unspecified: Secondary | ICD-10-CM | POA: Diagnosis not present

## 2016-10-27 DIAGNOSIS — E860 Dehydration: Secondary | ICD-10-CM | POA: Diagnosis present

## 2016-10-27 DIAGNOSIS — N179 Acute kidney failure, unspecified: Secondary | ICD-10-CM | POA: Diagnosis not present

## 2016-10-27 DIAGNOSIS — R0902 Hypoxemia: Secondary | ICD-10-CM | POA: Diagnosis not present

## 2016-10-27 DIAGNOSIS — Z7901 Long term (current) use of anticoagulants: Secondary | ICD-10-CM | POA: Diagnosis not present

## 2016-10-27 DIAGNOSIS — N390 Urinary tract infection, site not specified: Secondary | ICD-10-CM | POA: Diagnosis not present

## 2016-10-27 DIAGNOSIS — R0602 Shortness of breath: Secondary | ICD-10-CM | POA: Diagnosis not present

## 2016-10-27 DIAGNOSIS — Z87891 Personal history of nicotine dependence: Secondary | ICD-10-CM | POA: Diagnosis not present

## 2016-10-27 DIAGNOSIS — N189 Chronic kidney disease, unspecified: Secondary | ICD-10-CM | POA: Insufficient documentation

## 2016-10-27 DIAGNOSIS — R062 Wheezing: Secondary | ICD-10-CM | POA: Diagnosis not present

## 2016-10-27 DIAGNOSIS — Z79899 Other long term (current) drug therapy: Secondary | ICD-10-CM | POA: Diagnosis not present

## 2016-10-27 DIAGNOSIS — J9811 Atelectasis: Secondary | ICD-10-CM | POA: Diagnosis not present

## 2016-10-27 HISTORY — DX: Acute embolism and thrombosis of unspecified deep veins of unspecified lower extremity: I82.409

## 2016-10-27 LAB — COMPREHENSIVE METABOLIC PANEL
ALBUMIN: 3.5 g/dL (ref 3.5–5.0)
ALT: 16 U/L (ref 14–54)
ANION GAP: 9 (ref 5–15)
AST: 23 U/L (ref 15–41)
Alkaline Phosphatase: 116 U/L (ref 38–126)
BUN: 26 mg/dL — ABNORMAL HIGH (ref 6–20)
CO2: 21 mmol/L — AB (ref 22–32)
Calcium: 8.7 mg/dL — ABNORMAL LOW (ref 8.9–10.3)
Chloride: 105 mmol/L (ref 101–111)
Creatinine, Ser: 1.46 mg/dL — ABNORMAL HIGH (ref 0.44–1.00)
GFR calc Af Amer: 34 mL/min — ABNORMAL LOW (ref >60)
GFR calc non Af Amer: 29 mL/min — ABNORMAL LOW (ref >60)
GLUCOSE: 161 mg/dL — AB (ref 65–99)
Potassium: 3.9 mmol/L (ref 3.5–5.1)
Sodium: 135 mmol/L (ref 135–145)
Total Bilirubin: 0.3 mg/dL (ref 0.3–1.2)
Total Protein: 7.4 g/dL (ref 6.5–8.1)

## 2016-10-27 LAB — CBC WITH DIFFERENTIAL/PLATELET
BASOS PCT: 0 %
Basophils Absolute: 0 10*3/uL (ref 0.0–0.1)
EOS ABS: 0.1 10*3/uL (ref 0.0–0.7)
Eosinophils Relative: 2 %
HCT: 30.2 % — ABNORMAL LOW (ref 36.0–46.0)
Hemoglobin: 10 g/dL — ABNORMAL LOW (ref 12.0–15.0)
Lymphocytes Relative: 29 %
Lymphs Abs: 2.1 10*3/uL (ref 0.7–4.0)
MCH: 29.9 pg (ref 26.0–34.0)
MCHC: 33.1 g/dL (ref 30.0–36.0)
MCV: 90.4 fL (ref 78.0–100.0)
Monocytes Absolute: 0.7 10*3/uL (ref 0.1–1.0)
Monocytes Relative: 9 %
NEUTROS PCT: 60 %
Neutro Abs: 4.4 10*3/uL (ref 1.7–7.7)
Platelets: 267 10*3/uL (ref 150–400)
RBC: 3.34 MIL/uL — ABNORMAL LOW (ref 3.87–5.11)
RDW: 14.5 % (ref 11.5–15.5)
WBC: 7.2 10*3/uL (ref 4.0–10.5)

## 2016-10-27 LAB — URINALYSIS, ROUTINE W REFLEX MICROSCOPIC
Bilirubin Urine: NEGATIVE
Glucose, UA: NEGATIVE mg/dL
Ketones, ur: NEGATIVE mg/dL
NITRITE: NEGATIVE
Protein, ur: NEGATIVE mg/dL
Specific Gravity, Urine: 1.004 — ABNORMAL LOW (ref 1.005–1.030)
pH: 5 (ref 5.0–8.0)

## 2016-10-27 LAB — TROPONIN I

## 2016-10-27 LAB — LACTIC ACID, PLASMA
LACTIC ACID, VENOUS: 1.5 mmol/L (ref 0.5–1.9)
Lactic Acid, Venous: 2.6 mmol/L (ref 0.5–1.9)

## 2016-10-27 LAB — TSH: TSH: 6.076 u[IU]/mL — AB (ref 0.350–4.500)

## 2016-10-27 LAB — BRAIN NATRIURETIC PEPTIDE: B Natriuretic Peptide: 54 pg/mL (ref 0.0–100.0)

## 2016-10-27 MED ORDER — PRAVASTATIN SODIUM 40 MG PO TABS
40.0000 mg | ORAL_TABLET | Freq: Every day | ORAL | Status: DC
  Administered 2016-10-27 – 2016-10-29 (×3): 40 mg via ORAL
  Filled 2016-10-27 (×3): qty 1

## 2016-10-27 MED ORDER — TRAZODONE HCL 50 MG PO TABS
25.0000 mg | ORAL_TABLET | Freq: Every day | ORAL | Status: DC
  Administered 2016-10-27 – 2016-10-29 (×3): 25 mg via ORAL
  Filled 2016-10-27 (×3): qty 1

## 2016-10-27 MED ORDER — NIFEDIPINE ER OSMOTIC RELEASE 30 MG PO TB24
30.0000 mg | ORAL_TABLET | Freq: Every day | ORAL | Status: DC
  Administered 2016-10-28 – 2016-10-30 (×3): 30 mg via ORAL
  Filled 2016-10-27 (×3): qty 1

## 2016-10-27 MED ORDER — RISPERIDONE 0.5 MG PO TABS
0.2500 mg | ORAL_TABLET | Freq: Every day | ORAL | Status: DC
  Administered 2016-10-28 – 2016-10-30 (×3): 0.25 mg via ORAL
  Filled 2016-10-27 (×3): qty 1

## 2016-10-27 MED ORDER — SODIUM CHLORIDE 0.9 % IV SOLN
INTRAVENOUS | Status: DC
  Administered 2016-10-27 – 2016-10-28 (×2): via INTRAVENOUS

## 2016-10-27 MED ORDER — CARBOXYMETHYLCELLULOSE SODIUM 0.5 % OP SOLN: 1.0000 [drp] | Freq: Four times a day (QID) | OPHTHALMIC | Status: DC

## 2016-10-27 MED ORDER — APIXABAN 5 MG PO TABS
5.0000 mg | ORAL_TABLET | Freq: Two times a day (BID) | ORAL | Status: DC
  Administered 2016-10-27 – 2016-10-30 (×6): 5 mg via ORAL
  Filled 2016-10-27 (×6): qty 1

## 2016-10-27 MED ORDER — POLYETHYLENE GLYCOL 3350 17 GM/SCOOP PO POWD
17.0000 g | Freq: Every day | ORAL | Status: DC
  Filled 2016-10-27: qty 255

## 2016-10-27 MED ORDER — SODIUM CHLORIDE 0.9 % IV BOLUS (SEPSIS)
500.0000 mL | Freq: Once | INTRAVENOUS | Status: AC
  Administered 2016-10-27: 500 mL via INTRAVENOUS

## 2016-10-27 MED ORDER — DEXTROSE 5 % IV SOLN
1.0000 g | INTRAVENOUS | Status: DC
  Administered 2016-10-28 – 2016-10-29 (×2): 1 g via INTRAVENOUS
  Filled 2016-10-27 (×4): qty 10

## 2016-10-27 MED ORDER — PANTOPRAZOLE SODIUM 40 MG PO TBEC
40.0000 mg | DELAYED_RELEASE_TABLET | Freq: Every day | ORAL | Status: DC
Start: 2016-10-28 — End: 2016-10-30
  Administered 2016-10-28 – 2016-10-30 (×3): 40 mg via ORAL
  Filled 2016-10-27 (×3): qty 1

## 2016-10-27 MED ORDER — GABAPENTIN 600 MG PO TABS
600.0000 mg | ORAL_TABLET | Freq: Every day | ORAL | Status: DC
  Filled 2016-10-27 (×2): qty 1

## 2016-10-27 MED ORDER — FLUTICASONE PROPIONATE 50 MCG/ACT NA SUSP
1.0000 | Freq: Every day | NASAL | Status: DC
  Administered 2016-10-28 – 2016-10-30 (×3): 1 via NASAL
  Filled 2016-10-27: qty 16

## 2016-10-27 MED ORDER — LEVOTHYROXINE SODIUM 88 MCG PO TABS
88.0000 ug | ORAL_TABLET | Freq: Every day | ORAL | Status: DC
  Administered 2016-10-28 – 2016-10-30 (×3): 88 ug via ORAL
  Filled 2016-10-27 (×3): qty 1

## 2016-10-27 MED ORDER — LINACLOTIDE 145 MCG PO CAPS
290.0000 ug | ORAL_CAPSULE | Freq: Every day | ORAL | Status: DC
  Administered 2016-10-28 – 2016-10-30 (×3): 290 ug via ORAL
  Filled 2016-10-27 (×3): qty 2

## 2016-10-27 MED ORDER — ACETAMINOPHEN 650 MG RE SUPP: 650.0000 mg | Freq: Four times a day (QID) | RECTAL | Status: DC | PRN

## 2016-10-27 MED ORDER — IPRATROPIUM-ALBUTEROL 0.5-2.5 (3) MG/3ML IN SOLN
3.0000 mL | Freq: Once | RESPIRATORY_TRACT | Status: AC
  Administered 2016-10-27: 3 mL via RESPIRATORY_TRACT
  Filled 2016-10-27: qty 3

## 2016-10-27 MED ORDER — ACETAMINOPHEN 325 MG PO TABS
650.0000 mg | ORAL_TABLET | Freq: Four times a day (QID) | ORAL | Status: DC | PRN
  Administered 2016-10-28: 650 mg via ORAL
  Filled 2016-10-27: qty 2

## 2016-10-27 MED ORDER — POLYVINYL ALCOHOL 1.4 % OP SOLN
1.0000 [drp] | Freq: Four times a day (QID) | OPHTHALMIC | Status: DC
  Administered 2016-10-27 – 2016-10-30 (×10): 1 [drp] via OPHTHALMIC
  Filled 2016-10-27: qty 15

## 2016-10-27 MED ORDER — DEXTROSE 5 % IV SOLN
1.0000 g | Freq: Once | INTRAVENOUS | Status: AC
  Administered 2016-10-27: 1 g via INTRAVENOUS
  Filled 2016-10-27: qty 10

## 2016-10-27 MED ORDER — RISPERIDONE 0.5 MG PO TABS
0.5000 mg | ORAL_TABLET | Freq: Every day | ORAL | Status: DC
  Administered 2016-10-27 – 2016-10-29 (×3): 0.5 mg via ORAL
  Filled 2016-10-27 (×3): qty 1

## 2016-10-27 MED ORDER — SODIUM CHLORIDE 0.9% FLUSH: 3.0000 mL | Freq: Two times a day (BID) | INTRAVENOUS | Status: DC

## 2016-10-27 MED ORDER — ROPINIROLE HCL 0.25 MG PO TABS
0.5000 mg | ORAL_TABLET | Freq: Every day | ORAL | Status: DC
  Administered 2016-10-27 – 2016-10-29 (×3): 0.5 mg via ORAL
  Filled 2016-10-27 (×3): qty 2

## 2016-10-27 MED ORDER — ALBUTEROL SULFATE (2.5 MG/3ML) 0.083% IN NEBU
2.5000 mg | INHALATION_SOLUTION | Freq: Once | RESPIRATORY_TRACT | Status: AC
  Administered 2016-10-27: 2.5 mg via RESPIRATORY_TRACT
  Filled 2016-10-27: qty 3

## 2016-10-27 MED ORDER — OXYBUTYNIN CHLORIDE 5 MG PO TABS
5.0000 mg | ORAL_TABLET | Freq: Two times a day (BID) | ORAL | Status: DC
  Administered 2016-10-27 – 2016-10-30 (×6): 5 mg via ORAL
  Filled 2016-10-27 (×6): qty 1

## 2016-10-27 MED ORDER — FLUTICASONE FUROATE 27.5 MCG/SPRAY NA SUSP: 1.0000 | Freq: Every day | NASAL | Status: DC

## 2016-10-27 NOTE — ED Provider Notes (Signed)
[facility] DEPT Provider Note   CSN: [id number] Arrival date & time: 10/27/16  1358     History   Chief Complaint Chief Complaint  Patient presents with  . Shortness of Breath    HPI Christine Hancock is a 83 y.o. female.  HPI  Pt was seen at 1415. Per EMS, NH report and pt: Pt c/o gradual onset and persistence of constant "wheezing" and "a little cough" for "a while now." EMS states pt's O2 Sat was 86% R/A on their arrival to scene; improved to 97% on O2 2L N/C. Pt states she walked to the [place] because she had "a different sensation" in her bladder for the past 4 to 5 days. Denies CP/palpitations, no abd pain, no N/V/D, no back pain, no hematuria, no fevers.    Past Medical History:  Diagnosis Date  . Anxiety   . DVT, lower extremity, recurrent ([place])   . GERD (gastroesophageal reflux disease)   . Hypercholesterolemia   . Hypotension   . Hypothyroidism   . Phlebitis     Patient Active Problem List   Diagnosis Date Noted  . Dehydration 10/24/2011  . Acute on chronic renal failure ([place]) 10/24/2011  . Failure to thrive in adult 10/24/2011  . Anemia 10/24/2011  . Anxiety 10/24/2011  . Hypothyroidism 10/24/2011    Past Surgical History:  Procedure Laterality Date  . ABDOMINAL HYSTERECTOMY     partial  . CATARACT EXTRACTION W/PHACO Right 06/28/2013   Procedure: CATARACT EXTRACTION PHACO AND INTRAOCULAR LENS PLACEMENT (IOC);  Surgeon: Tonny Branch, MD;  Location: [facility];  Service: Ophthalmology;  Laterality: Right;  CDE:13.79  . CATARACT EXTRACTION W/PHACO Left 07/26/2013   Procedure: CATARACT EXTRACTION PHACO AND INTRAOCULAR LENS PLACEMENT LEFT EYE;  Surgeon: Tonny Branch, MD;  Location: [facility];  Service: Ophthalmology;  Laterality: Left;  CDE: 11.76  . CHOLECYSTECTOMY    . ESOPHAGEAL DILATION    . TONSILLECTOMY      OB History    No data available       Home Medications    Prior to Admission medications   Medication Sig Start Date End Date Taking?  Authorizing Provider  carboxymethylcellulose (REFRESH PLUS) 0.5 % SOLN Place 1 drop into both eyes 4 (four) times daily.   Yes [provider]  ELIQUIS 5 MG TABS tablet Take 5 mg by mouth 2 (two) times daily.  09/07/16  Yes [provider]  fluticasone (VERAMYST) 27.5 MCG/SPRAY nasal spray Place 1 spray into the nose daily as needed for rhinitis.   Yes [provider]  furosemide (LASIX) 40 MG tablet Take 40 mg by mouth daily.   Yes [provider]  gabapentin (NEURONTIN) 300 MG capsule Take 600 mg by mouth at bedtime.   Yes [provider]  gabapentin (NEURONTIN) 600 MG tablet Take 600 mg by mouth at bedtime.  09/07/16  Yes [provider]  ketoconazole (NIZORAL) 2 % shampoo Apply 1 application topically once a week. As needed for antifungal.   Yes [provider]  levothyroxine (SYNTHROID, LEVOTHROID) 88 MCG tablet Take 88 mcg by mouth daily before breakfast.   Yes [provider]  LINZESS 290 MCG CAPS capsule Take 290 mcg by mouth daily before breakfast.  09/07/16  Yes [provider]  NIFEdipine (PROCARDIA-XL/ADALAT CC) 30 MG 24 hr tablet Take 30 mg by mouth daily.   Yes [provider]  omeprazole (PRILOSEC) 20 MG capsule Take 20 mg by mouth daily.   Yes [provider]  oxybutynin (DITROPAN) 5 MG tablet Take 5 mg by mouth 2 (two) times daily.  09/07/16  Yes [provider]  polyethylene glycol powder (GLYCOLAX/MIRALAX) powder Take 17 g by mouth daily.  09/20/16  Yes [provider]  pravastatin (PRAVACHOL) 40 MG tablet Take 40 mg by mouth at bedtime.  09/07/16  Yes [provider]  risperiDONE (RISPERDAL) 0.25 MG tablet Take 0.25 mg by mouth daily.   Yes [provider]  risperiDONE (RISPERDAL) 0.5 MG tablet Take 0.5 mg by mouth at bedtime.    Yes [provider]  rOPINIRole (REQUIP) 0.5 MG tablet Take 0.5 mg by mouth at bedtime.    Yes [provider]  tobramycin-dexamethasone (TOBRADEX) ophthalmic ointment Place 1 application into both eyes 3 (three) times daily.   Yes [provider]  traZODone (DESYREL) 50 MG tablet Take 25 mg by mouth at bedtime.   Yes [provider]    Family History Family History  Problem Relation Age of Onset  . Diabetes Mother   . Diabetes Brother   . Anemia Brother   . Anemia Sister   . Coronary artery disease Other     Social History Social History  Substance Use Topics  . Smoking status: Former Smoker    Packs/day: 0.25    Years: 15.00    Types: Cigarettes    Quit date: 10/22/2005  . Smokeless tobacco: Never Used  . Alcohol use No     Allergies   Patient has no known allergies.   Review of Systems Review of Systems ROS: Statement: All systems negative except as marked or noted in the HPI; Constitutional: Negative for fever and chills. ; ; Eyes: Negative for eye pain, redness and discharge. ; ; ENMT: Negative for ear pain, hoarseness, nasal congestion, sinus pressure and sore throat. ; ; Cardiovascular: Negative for chest pain, palpitations, diaphoresis, dyspnea and peripheral edema. ; ; Respiratory: +cough, wheezing. Negative for stridor. ; ; Gastrointestinal: Negative for nausea, vomiting, diarrhea, abdominal pain, blood in stool, hematemesis, jaundice and rectal bleeding. . ; ; Genitourinary: +dysuria. Negative for flank pain and hematuria. ; ; Musculoskeletal: Negative for back pain and neck pain. Negative for swelling and trauma.; ; Skin: Negative for pruritus, rash, abrasions, blisters, bruising and skin lesion.; ; Neuro: Negative for headache, lightheadedness and neck stiffness. Negative for weakness, altered level of consciousness, altered mental status, extremity weakness, paresthesias, involuntary movement, seizure and syncope.       Physical Exam Updated Vital Signs BP 125/63   Pulse 88   Temp 98.2 F (36.8 C) (Oral)   Resp 20   Ht 5\' 2"  (1.575 m)   Wt 82.6  kg (182 lb)   SpO2 98%   BMI 33.29 kg/m   Physical Exam 1420: Physical examination:  Nursing notes reviewed; Vital signs and O2 SAT reviewed;  Constitutional: Well developed, Well nourished, Well hydrated, In no acute distress; Head:  Normocephalic, atraumatic; Eyes: EOMI, PERRL, No scleral icterus; ENMT: Mouth and pharynx normal, Mucous membranes moist; Neck: Supple, Full range of motion, No lymphadenopathy; Cardiovascular: Regular rate and rhythm, No gallop; Respiratory: Breath sounds diminished & equal bilaterally, scattered wheezes bilat. No audible wheezing. Speaking full sentences with ease, Normal respiratory effort/excursion; Chest: Nontender, Movement normal; Abdomen: Soft, Nontender, Nondistended, Normal bowel sounds; Genitourinary: No CVA tenderness; Extremities: Pulses normal, No tenderness, +1 pedal edema bilat. No calf asymmetry.; Neuro: AA&Ox3, tangential historian. Major CN grossly intact. No facial droop. Speech clear. No gross focal motor or sensory  deficits in extremities.; Skin: Color normal, Warm, Dry.   ED Treatments / Results  Labs (all labs ordered are listed, but only abnormal results are displayed)   EKG  EKG Interpretation  Date/Time:  Wednesday October 27 2016 14:08:46 EDT Ventricular Rate:  81 PR Interval:    QRS Duration: 122 QT Interval:  384 QTC Calculation: 446 R Axis:   -73 Text Interpretation:  Sinus rhythm Left axis deviation RBBB and LAFB Probable left ventricular hypertrophy When compared with ECG of 06/22/2013 Right bundle branch block is now Present Confirmed by Francine Graven [id number]) on 10/27/2016 2:29:40 PM       Radiology   Procedures Procedures (including critical care time)  Medications Ordered in ED Medications  albuterol (PROVENTIL) (2.5 MG/3ML) 0.083% nebulizer solution 2.5 mg (2.5 mg Nebulization Given 10/27/16 1520)  ipratropium-albuterol (DUONEB) 0.5-2.5 (3) MG/3ML nebulizer solution 3 mL (3 mLs Nebulization Given 10/27/16 1520)       Initial Impression / Assessment and Plan / ED Course  I have reviewed the triage vital signs and the nursing notes.  Pertinent labs & imaging results that were available during my care of the patient were reviewed by me and considered in my medical decision making (see chart for details).  MDM Reviewed: previous chart, nursing note and vitals Reviewed previous: labs and ECG Interpretation: labs, x-ray, ECG and CT scan    Results for orders placed or performed during the hospital encounter of 10/27/16  Comprehensive metabolic panel  Result Value Ref Range   Sodium 135 135 - 145 mmol/L   Potassium 3.9 3.5 - 5.1 mmol/L   Chloride 105 101 - 111 mmol/L   CO2 21 (L) 22 - 32 mmol/L   Glucose, Bld 161 (H) 65 - 99 mg/dL   BUN 26 (H) 6 - 20 mg/dL   Creatinine, Ser 1.46 (H) 0.44 - 1.00 mg/dL   Calcium 8.7 (L) 8.9 - 10.3 mg/dL   Total Protein 7.4 6.5 - 8.1 g/dL   Albumin 3.5 3.5 - 5.0 g/dL   AST 23 15 - 41 U/L   ALT 16 14 - 54 U/L   Alkaline Phosphatase 116 38 - 126 U/L   Total Bilirubin 0.3 0.3 - 1.2 mg/dL   GFR calc non Af Amer 29 (L) >60 mL/min   GFR calc Af Amer 34 (L) >60 mL/min   Anion gap 9 5 - 15  Brain natriuretic peptide  Result Value Ref Range   B Natriuretic Peptide 54.0 0.0 - 100.0 pg/mL  Troponin I  Result Value Ref Range   Troponin I <0.03 <0.03 ng/mL  Lactic acid, plasma  Result Value Ref Range   Lactic Acid, Venous 1.5 0.5 - 1.9 mmol/L  Lactic acid, plasma  Result Value Ref Range   Lactic Acid, Venous 2.6 (HH) 0.5 - 1.9 mmol/L  CBC with Differential  Result Value Ref Range   WBC 7.2 4.0 - 10.5 K/uL   RBC 3.34 (L) 3.87 - 5.11 MIL/uL   Hemoglobin 10.0 (L) 12.0 - 15.0 g/dL   HCT 30.2 (L) 36.0 - 46.0 %   MCV 90.4 78.0 - 100.0 fL   MCH 29.9 26.0 - 34.0 pg   MCHC 33.1 30.0 - 36.0 g/dL   RDW 14.5 11.5 - 15.5 %   Platelets 267 150 - 400 K/uL   Neutrophils Relative % 60 %   Neutro Abs 4.4 1.7 - 7.7 K/uL   Lymphocytes Relative 29 %   Lymphs Abs 2.1 0.7 -  4.0 K/uL  Monocytes Relative 9 %   Monocytes Absolute 0.7 0.1 - 1.0 K/uL   Eosinophils Relative 2 %   Eosinophils Absolute 0.1 0.0 - 0.7 K/uL   Basophils Relative 0 %   Basophils Absolute 0.0 0.0 - 0.1 K/uL  Urinalysis, Routine w reflex microscopic  Result Value Ref Range   Color, Urine STRAW (A) YELLOW   APPearance CLEAR CLEAR   Specific Gravity, Urine 1.004 (L) 1.005 - 1.030   pH 5.0 5.0 - 8.0   Glucose, UA NEGATIVE NEGATIVE mg/dL   Hgb urine dipstick SMALL (A) NEGATIVE   Bilirubin Urine NEGATIVE NEGATIVE   Ketones, ur NEGATIVE NEGATIVE mg/dL   Protein, ur NEGATIVE NEGATIVE mg/dL   Nitrite NEGATIVE NEGATIVE   Leukocytes, UA LARGE (A) NEGATIVE   RBC / HPF 0-5 0 - 5 RBC/hpf   WBC, UA TOO NUMEROUS TO COUNT 0 - 5 WBC/hpf   Bacteria, UA RARE (A) NONE SEEN   Squamous Epithelial / LPF 0-5 (A) NONE SEEN   Ct Chest Wo Contrast Result Date: 10/27/2016 CLINICAL DATA:  Persistent cough and shortness of breath today. EXAM: CT CHEST WITHOUT CONTRAST TECHNIQUE: Multidetector CT imaging of the chest was performed following the standard protocol without IV contrast. COMPARISON:  CT scan dated 04/05/2016 FINDINGS: Cardiovascular: Extensive aortic atherosclerosis. Extensive coronary artery calcifications. Overall heart size is normal. No pericardial effusion. Normal heart size. No pericardial effusion. Mediastinum/Nodes: No enlarged mediastinal or axillary lymph nodes. Thyroid gland and trachea demonstrate no significant findings. Small hiatal hernia. Lungs/Pleura: Slight atelectasis at the lung bases, right greater than left. No infiltrates or effusions. Upper Abdomen: Multiple bilateral renal cysts. Musculoskeletal: No chest wall mass or suspicious bone lesions identified. IMPRESSION: No acute abnormalities other than slight bibasilar atelectasis. Aortic atherosclerosis. Extensive coronary artery calcifications. Electronically Signed   By: Lorriane Shire M.D.   On: 10/27/2016 17:49   Dg Chest Port 1  View Result Date: 10/27/2016 CLINICAL DATA:  Short of breath and wheezing EXAM: PORTABLE CHEST 1 VIEW COMPARISON:  07/05/2016 FINDINGS: The heart size and mediastinal contours are within normal limits. Both lungs are clear. The visualized skeletal structures are unremarkable. IMPRESSION: No active disease. Electronically Signed   By: Franchot Gallo M.D.   On: 10/27/2016 14:47    2020:   BUN/rCr near baseline.  Pt given short neb on arrival for wheezing with lungs now CTA bilat, Sats 99% R/A. Pt ambulated with O2 Sats remaining 95%< on R/A. Denied SOB/CP. +UTI, UC pending; IV rocephin given. 2nd lactic acid trending upward; will dose judicious IVF, admit. T/C to [facility]. [place] Comment, case discussed, including:  HPI, pertinent PM/SHx, VS/PE, dx testing, ED course and treatment:  Agreeable to admit.      Final Clinical Impressions(s) / ED Diagnoses   Final diagnoses:  None    New Prescriptions New Prescriptions   No medications on file     Francine Graven, DO 11/01/16 1520

## 2016-10-27 NOTE — H&P (Signed)
History and Physical    Christine Hancock GDJ:[id number] DOB: 1917-06-30 DOA: 10/27/2016  PCP: Sinda Du, MD  Patient coming from: assisted living.    Chief Complaint:  SOB and wheezing.   HPI: Christine Hancock is an 83 y.o. female with hx of anxiety, DVT on Eliquis, HLD, hypothyrodism, presented to the ER with SOB.  She has no chest pain, fever, chills or coughs.  Work up in the ER included a clear CXR, no leukocytosis, Hb stable at 10g per dL, and her renal Fx showed Cr of 1.46.  Her UA showed TNTC WBCs.  She was given a neb Tx, and no longer SOB.  There is no wheezing.  She was given IV Rocephin in the ER for her UTI, however, her lactic acid rose slightly from 1.5 to 2.6.  She was going to be discharged, however, solely because of this elevation of lactic acid, Dr Thurnell Garbe asked me to admit her for further evaluation.    ED Course:  See above.  Rewiew of Systems:  Constitutional: Negative for malaise, fever and chills. No significant weight loss or weight gain Eyes: Negative for eye pain, redness and discharge, diplopia, visual changes, or flashes of light. ENMT: Negative for ear pain, hoarseness, nasal congestion, sinus pressure and sore throat. No headaches; tinnitus, drooling, or problem swallowing. Cardiovascular: Negative for chest pain, palpitations, diaphoresis, and peripheral edema. ; No orthopnea, PND Respiratory: Negative for cough, hemoptysis, wheezing and stridor. No pleuritic chestpain. Gastrointestinal: Negative for diarrhea, constipation,  melena, blood in stool, hematemesis, jaundice and rectal bleeding.    Genitourinary: Negative for frequency, dysuria, incontinence,flank pain and hematuria; Musculoskeletal: Negative for back pain and neck pain. Negative for swelling and trauma.;  Skin: . Negative for pruritus, rash, abrasions, bruising and skin lesion.; ulcerations Neuro: Negative for headache, lightheadedness and neck stiffness. Negative for weakness, altered level of  consciousness , altered mental status, extremity weakness, burning feet, involuntary movement, seizure and syncope.  Psych: negative for anxiety, depression, insomnia, tearfulness, panic attacks, hallucinations, paranoia, suicidal or homicidal ideation    Past Medical History:  Diagnosis Date  . Anxiety   . DVT, lower extremity, recurrent ([place])   . GERD (gastroesophageal reflux disease)   . Hypercholesterolemia   . Hypotension   . Hypothyroidism   . Phlebitis     Past Surgical History:  Procedure Laterality Date  . ABDOMINAL HYSTERECTOMY     partial  . CATARACT EXTRACTION W/PHACO Right 06/28/2013   Procedure: CATARACT EXTRACTION PHACO AND INTRAOCULAR LENS PLACEMENT (IOC);  Surgeon: Tonny Branch, MD;  Location: [facility];  Service: Ophthalmology;  Laterality: Right;  CDE:13.79  . CATARACT EXTRACTION W/PHACO Left 07/26/2013   Procedure: CATARACT EXTRACTION PHACO AND INTRAOCULAR LENS PLACEMENT LEFT EYE;  Surgeon: Tonny Branch, MD;  Location: [facility];  Service: Ophthalmology;  Laterality: Left;  CDE: 11.76  . CHOLECYSTECTOMY    . ESOPHAGEAL DILATION    . TONSILLECTOMY       reports that she quit smoking about 11 years ago. Her smoking use included Cigarettes. She has a 3.75 pack-year smoking history. She has never used smokeless tobacco. She reports that she does not drink alcohol or use drugs.  No Known Allergies  Family History  Problem Relation Age of Onset  . Diabetes Mother   . Diabetes Brother   . Anemia Brother   . Anemia Sister   . Coronary artery disease Other      Prior to Admission medications   Medication Sig Start Date End Date  Taking? Authorizing Provider  carboxymethylcellulose (REFRESH PLUS) 0.5 % SOLN Place 1 drop into both eyes 4 (four) times daily.   Yes [provider]  ELIQUIS 5 MG TABS tablet Take 5 mg by mouth 2 (two) times daily.  09/07/16  Yes [provider]  fluticasone (VERAMYST) 27.5 MCG/SPRAY nasal spray Place 1 spray into the nose daily as  needed for rhinitis.   Yes [provider]  furosemide (LASIX) 40 MG tablet Take 40 mg by mouth daily.   Yes [provider]  gabapentin (NEURONTIN) 300 MG capsule Take 600 mg by mouth at bedtime.   Yes [provider]  gabapentin (NEURONTIN) 600 MG tablet Take 600 mg by mouth at bedtime.  09/07/16  Yes [provider]  ketoconazole (NIZORAL) 2 % shampoo Apply 1 application topically once a week. As needed for antifungal.   Yes [provider]  levothyroxine (SYNTHROID, LEVOTHROID) 88 MCG tablet Take 88 mcg by mouth daily before breakfast.   Yes [provider]  LINZESS 290 MCG CAPS capsule Take 290 mcg by mouth daily before breakfast.  09/07/16  Yes [provider]  NIFEdipine (PROCARDIA-XL/ADALAT CC) 30 MG 24 hr tablet Take 30 mg by mouth daily.   Yes [provider]  omeprazole (PRILOSEC) 20 MG capsule Take 20 mg by mouth daily.   Yes [provider]  oxybutynin (DITROPAN) 5 MG tablet Take 5 mg by mouth 2 (two) times daily.  09/07/16  Yes [provider]  polyethylene glycol powder (GLYCOLAX/MIRALAX) powder Take 17 g by mouth daily.  09/20/16  Yes [provider]  pravastatin (PRAVACHOL) 40 MG tablet Take 40 mg by mouth at bedtime.  09/07/16  Yes [provider]  risperiDONE (RISPERDAL) 0.25 MG tablet Take 0.25 mg by mouth daily.   Yes [provider]  risperiDONE (RISPERDAL) 0.5 MG tablet Take 0.5 mg by mouth at bedtime.    Yes [provider]  rOPINIRole (REQUIP) 0.5 MG tablet Take 0.5 mg by mouth at bedtime.    Yes [provider]  tobramycin-dexamethasone (TOBRADEX) ophthalmic ointment Place 1 application into both eyes 3 (three) times daily.   Yes [provider]  traZODone (DESYREL) 50 MG tablet Take 25 mg by mouth at bedtime.   Yes [provider]    Physical Exam: Vitals:   10/27/16 1700 10/27/16 1730 10/27/16 1746 10/27/16 1948  BP:  131/63 (!) 118/54    Pulse:    96  Resp: 12 (!) 26  19  Temp:      TempSrc:      SpO2:   99% 97%  Weight:      Height:          Constitutional: NAD, calm, comfortable Vitals:   10/27/16 1700 10/27/16 1730 10/27/16 1746 10/27/16 1948  BP: 131/63 (!) 118/54    Pulse:    96  Resp: 12 (!) 26  19  Temp:      TempSrc:      SpO2:   99% 97%  Weight:      Height:       Eyes: PERRL, lids and conjunctivae normal ENMT: Mucous membranes are moist. Posterior pharynx clear of any exudate or lesions.Normal dentition.  Neck: normal, supple, no masses, no thyromegaly Respiratory: clear to auscultation bilaterally, no wheezing, no crackles. Normal respiratory effort. No accessory muscle use.  Cardiovascular: Regular rate and rhythm, no murmurs / rubs / gallops. No extremity edema. 2+ pedal pulses. No carotid bruits.  Abdomen: no  tenderness, no masses palpated. No hepatosplenomegaly. Bowel sounds positive.  Musculoskeletal: no clubbing / cyanosis. No joint deformity upper and lower extremities. Good ROM, no contractures. Normal muscle tone.  Skin: no rashes, lesions, ulcers. No induration Neurologic: CN 2-12 grossly intact. Sensation intact, DTR normal. Strength 5/5 in all 4.  Psychiatric: Normal judgment and insight. Alert and oriented x 3. Normal mood.    Labs on Admission: I have personally reviewed following labs and imaging studies CBC:  Recent Labs Lab 10/27/16 1442  WBC 7.2  NEUTROABS 4.4  HGB 10.0*  HCT 30.2*  MCV 90.4  PLT 144   Basic Metabolic Panel:  Recent Labs Lab 10/27/16 1442  NA 135  K 3.9  CL 105  CO2 21*  GLUCOSE 161*  BUN 26*  CREATININE 1.46*  CALCIUM 8.7*   GFR: Estimated Creatinine Clearance: 21.9 mL/min (A) (by C-G formula based on SCr of 1.46 mg/dL (H)). Liver Function Tests:  Recent Labs Lab 10/27/16 1442  AST 23  ALT 16  ALKPHOS 116  BILITOT 0.3  PROT 7.4  ALBUMIN 3.5   Cardiac Enzymes:  Recent Labs Lab 10/27/16 1442  TROPONINI  <0.03   Urine analysis:    Component Value Date/Time   COLORURINE STRAW (A) 10/27/2016 1422   APPEARANCEUR CLEAR 10/27/2016 1422   LABSPEC 1.004 (L) 10/27/2016 1422   PHURINE 5.0 10/27/2016 1422   GLUCOSEU NEGATIVE 10/27/2016 1422   HGBUR SMALL (A) 10/27/2016 1422   BILIRUBINUR NEGATIVE 10/27/2016 1422   KETONESUR NEGATIVE 10/27/2016 1422   PROTEINUR NEGATIVE 10/27/2016 1422   UROBILINOGEN 0.2 10/23/2011 1500   NITRITE NEGATIVE 10/27/2016 1422   LEUKOCYTESUR LARGE (A) 10/27/2016 1422   Radiological Exams on Admission: Ct Chest Wo Contrast  Result Date: 10/27/2016 CLINICAL DATA:  Persistent cough and shortness of breath today. EXAM: CT CHEST WITHOUT CONTRAST TECHNIQUE: Multidetector CT imaging of the chest was performed following the standard protocol without IV contrast. COMPARISON:  CT scan dated 04/05/2016 FINDINGS: Cardiovascular: Extensive aortic atherosclerosis. Extensive coronary artery calcifications. Overall heart size is normal. No pericardial effusion. Normal heart size. No pericardial effusion. Mediastinum/Nodes: No enlarged mediastinal or axillary lymph nodes. Thyroid gland and trachea demonstrate no significant findings. Small hiatal hernia. Lungs/Pleura: Slight atelectasis at the lung bases, right greater than left. No infiltrates or effusions. Upper Abdomen: Multiple bilateral renal cysts. Musculoskeletal: No chest wall mass or suspicious bone lesions identified. IMPRESSION: No acute abnormalities other than slight bibasilar atelectasis. Aortic atherosclerosis. Extensive coronary artery calcifications. Electronically Signed   By: Lorriane Shire M.D.   On: 10/27/2016 17:49   Dg Chest Port 1 View  Result Date: 10/27/2016 CLINICAL DATA:  Short of breath and wheezing EXAM: PORTABLE CHEST 1 VIEW COMPARISON:  07/05/2016 FINDINGS: The heart size and mediastinal contours are within normal limits. Both lungs are clear. The visualized skeletal structures are unremarkable. IMPRESSION: No  active disease. Electronically Signed   By: Franchot Gallo M.D.   On: 10/27/2016 14:47   Assessment/Plan Principal Problem:   Acute on chronic renal failure (HCC) Active Problems:   Dehydration   Anxiety   Hypothyroidism   Acute lower UTI   AKI (acute kidney injury) ([place])   PLAN:   Elevated Lactic acid:  I think the reason for her elevated lactic acid is dehydration.  She has no fever, chills, leukocytosis or hypotension, and she appears quite well.    UTI:  Will continue with Rocephin for her UTI.  Culture obtained.  Hypothyroidism:  Will continue with supplement.  Check TSH.  AKI:  Very mild.  Hold Lasix and give IVF.   Will repeat Cr in the morning.   DVT prophylaxis: on Eliquis.  Code Status: DNR, confirmed with her tonight.  She has the golden rod form.  Family Communication: None.  Disposition Plan: assisted living Consults called: None.  Admission status: OBS.    Cerenity Goshorn MD FACP. [facility]  If 7PM-7AM, please contact night-coverage [email or web address] Password [facility]  10/27/2016, 8:49 PM

## 2016-10-27 NOTE — ED Notes (Signed)
Pt ambulated well. O2 stayed at 95%.

## 2016-10-27 NOTE — ED Notes (Signed)
ED Provider at bedside. 

## 2016-10-27 NOTE — ED Triage Notes (Signed)
Sob while walking to nurses station at [facility] this morning.  Pt states she felt a different sensation in her bladder and was going to let nurse know.  Pt was 86% on ra upon ems arrival.  Pt on 2l via [place], sat's 97%.  A&o x 4.

## 2016-10-27 NOTE — ED Notes (Signed)
Date and time results received: 10/27/16 2013 (use smartphrase ".now" to insert current time)  Test: Lactic Critical Value: 2.6  Name of Provider Notified: Thurnell Garbe  Orders Received? Or Actions Taken?:

## 2016-10-28 DIAGNOSIS — N390 Urinary tract infection, site not specified: Secondary | ICD-10-CM | POA: Diagnosis not present

## 2016-10-28 DIAGNOSIS — R0902 Hypoxemia: Secondary | ICD-10-CM | POA: Diagnosis not present

## 2016-10-28 LAB — CBC
HCT: 29.7 % — ABNORMAL LOW (ref 36.0–46.0)
HEMOGLOBIN: 9.8 g/dL — AB (ref 12.0–15.0)
MCH: 29.7 pg (ref 26.0–34.0)
MCHC: 33 g/dL (ref 30.0–36.0)
MCV: 90 fL (ref 78.0–100.0)
Platelets: 258 10*3/uL (ref 150–400)
RBC: 3.3 MIL/uL — AB (ref 3.87–5.11)
RDW: 14.4 % (ref 11.5–15.5)
WBC: 6.2 10*3/uL (ref 4.0–10.5)

## 2016-10-28 LAB — BASIC METABOLIC PANEL
ANION GAP: 8 (ref 5–15)
BUN: 23 mg/dL — ABNORMAL HIGH (ref 6–20)
CHLORIDE: 108 mmol/L (ref 101–111)
CO2: 22 mmol/L (ref 22–32)
CREATININE: 1.16 mg/dL — AB (ref 0.44–1.00)
Calcium: 8.4 mg/dL — ABNORMAL LOW (ref 8.9–10.3)
GFR calc non Af Amer: 38 mL/min — ABNORMAL LOW (ref >60)
GFR, EST AFRICAN AMERICAN: 44 mL/min — AB (ref >60)
GLUCOSE: 117 mg/dL — AB (ref 65–99)
Potassium: 3.9 mmol/L (ref 3.5–5.1)
Sodium: 138 mmol/L (ref 135–145)

## 2016-10-28 LAB — LACTIC ACID, PLASMA: Lactic Acid, Venous: 2.2 mmol/L (ref 0.5–1.9)

## 2016-10-28 MED ORDER — CEFUROXIME AXETIL 250 MG PO TABS: 250.0000 mg | ORAL_TABLET | Freq: Two times a day (BID) | ORAL | 1 refills | Status: DC

## 2016-10-28 MED ORDER — GABAPENTIN 300 MG PO CAPS
600.0000 mg | ORAL_CAPSULE | Freq: Every day | ORAL | Status: DC
  Administered 2016-10-28 – 2016-10-29 (×2): 600 mg via ORAL
  Filled 2016-10-28 (×2): qty 2

## 2016-10-28 MED ORDER — POLYETHYLENE GLYCOL 3350 17 G PO PACK
17.0000 g | PACK | Freq: Every day | ORAL | Status: DC
  Administered 2016-10-28 – 2016-10-30 (×3): 17 g via ORAL
  Filled 2016-10-28 (×3): qty 1

## 2016-10-28 MED ORDER — ALBUTEROL SULFATE HFA 108 (90 BASE) MCG/ACT IN AERS: 2.0000 | INHALATION_SPRAY | Freq: Four times a day (QID) | RESPIRATORY_TRACT | 2 refills | Status: AC | PRN

## 2016-10-28 NOTE — Discharge Summary (Signed)
Physician Discharge Summary  Patient ID: Christine Hancock MRN: [id number] DOB/AGE: 09-22-1917 83 y.o. Primary Care Physician:Tsutomu Barfoot, Percell Miller, MD Admit date: 10/27/2016 Discharge date: 10/28/2016    Discharge Diagnoses:   Principal Problem:   Acute on chronic renal failure [facility]) Active Problems:   Dehydration   Anxiety   Hypothyroidism   Acute lower UTI   AKI (acute kidney injury) ([place]) Acute on chronic diastolic heart failure  Allergies as of 10/28/2016   No Known Allergies     Medication List    TAKE these medications   albuterol 108 (90 Base) MCG/ACT inhaler Commonly known as:  PROVENTIL HFA;VENTOLIN HFA Inhale 2 puffs into the lungs every 6 (six) hours as needed for wheezing or shortness of breath.   carboxymethylcellulose 0.5 % Soln Commonly known as:  REFRESH PLUS Place 1 drop into both eyes 4 (four) times daily.   cefUROXime 250 MG tablet Commonly known as:  CEFTIN Take 1 tablet (250 mg total) by mouth 2 (two) times daily with a meal.   ELIQUIS 5 MG Tabs tablet Generic drug:  apixaban Take 5 mg by mouth 2 (two) times daily.   fluticasone 27.5 MCG/SPRAY nasal spray Commonly known as:  VERAMYST Place 1 spray into the nose daily as needed for rhinitis.   furosemide 40 MG tablet Commonly known as:  LASIX Take 40 mg by mouth daily.   gabapentin 300 MG capsule Commonly known as:  NEURONTIN Take 600 mg by mouth at bedtime.   gabapentin 600 MG tablet Commonly known as:  NEURONTIN Take 600 mg by mouth at bedtime.   ketoconazole 2 % shampoo Commonly known as:  NIZORAL Apply 1 application topically once a week. As needed for antifungal.   levothyroxine 88 MCG tablet Commonly known as:  SYNTHROID, LEVOTHROID Take 88 mcg by mouth daily before breakfast.   LINZESS 290 MCG Caps capsule Generic drug:  linaclotide Take 290 mcg by mouth daily before breakfast.   NIFEdipine 30 MG 24 hr tablet Commonly known as:  PROCARDIA-XL/ADALAT CC Take 30 mg by mouth daily.    omeprazole 20 MG capsule Commonly known as:  PRILOSEC Take 20 mg by mouth daily.   oxybutynin 5 MG tablet Commonly known as:  DITROPAN Take 5 mg by mouth 2 (two) times daily.   polyethylene glycol powder powder Commonly known as:  GLYCOLAX/MIRALAX Take 17 g by mouth daily.   pravastatin 40 MG tablet Commonly known as:  PRAVACHOL Take 40 mg by mouth at bedtime.   risperiDONE 0.5 MG tablet Commonly known as:  RISPERDAL Take 0.5 mg by mouth at bedtime.   risperiDONE 0.25 MG tablet Commonly known as:  RISPERDAL Take 0.25 mg by mouth daily.   rOPINIRole 0.5 MG tablet Commonly known as:  REQUIP Take 0.5 mg by mouth at bedtime.   tobramycin-dexamethasone ophthalmic ointment Commonly known as:  TOBRADEX Place 1 application into both eyes 3 (three) times daily.   traZODone 50 MG tablet Commonly known as:  DESYREL Take 25 mg by mouth at bedtime.            Discharge Care Instructions        Start     Ordered   10/28/16 0000  albuterol (PROVENTIL HFA;VENTOLIN HFA) 108 (90 Base) MCG/ACT inhaler  Every 6 hours PRN     10/28/16 0833   10/28/16 0000  cefUROXime (CEFTIN) 250 MG tablet  2 times daily with meals     10/28/16 0833      Discharged Condition:Improved  Consults: None  Significant Diagnostic Studies: Ct Chest Wo Contrast  Result Date: 10/27/2016 CLINICAL DATA:  Persistent cough and shortness of breath today. EXAM: CT CHEST WITHOUT CONTRAST TECHNIQUE: Multidetector CT imaging of the chest was performed following the standard protocol without IV contrast. COMPARISON:  CT scan dated 04/05/2016 FINDINGS: Cardiovascular: Extensive aortic atherosclerosis. Extensive coronary artery calcifications. Overall heart size is normal. No pericardial effusion. Normal heart size. No pericardial effusion. Mediastinum/Nodes: No enlarged mediastinal or axillary lymph nodes. Thyroid gland and trachea demonstrate no significant findings. Small hiatal hernia. Lungs/Pleura: Slight  atelectasis at the lung bases, right greater than left. No infiltrates or effusions. Upper Abdomen: Multiple bilateral renal cysts. Musculoskeletal: No chest wall mass or suspicious bone lesions identified. IMPRESSION: No acute abnormalities other than slight bibasilar atelectasis. Aortic atherosclerosis. Extensive coronary artery calcifications. Electronically Signed   By: Lorriane Shire M.D.   On: 10/27/2016 17:49   Dg Chest Port 1 View  Result Date: 10/27/2016 CLINICAL DATA:  Short of breath and wheezing EXAM: PORTABLE CHEST 1 VIEW COMPARISON:  07/05/2016 FINDINGS: The heart size and mediastinal contours are within normal limits. Both lungs are clear. The visualized skeletal structures are unremarkable. IMPRESSION: No active disease. Electronically Signed   By: Franchot Gallo M.D.   On: 10/27/2016 14:47    Lab Results: Basic Metabolic Panel:  Recent Labs  10/27/16 1442 10/28/16 0424  NA 135 138  K 3.9 3.9  CL 105 108  CO2 21* 22  GLUCOSE 161* 117*  BUN 26* 23*  CREATININE 1.46* 1.16*  CALCIUM 8.7* 8.4*   Liver Function Tests:  Recent Labs  10/27/16 1442  AST 23  ALT 16  ALKPHOS 116  BILITOT 0.3  PROT 7.4  ALBUMIN 3.5     CBC:  Recent Labs  10/27/16 1442 10/28/16 0424  WBC 7.2 6.2  NEUTROABS 4.4  --   HGB 10.0* 9.8*  HCT 30.2* 29.7*  MCV 90.4 90.0  PLT 267 258    No results found for this or any previous visit (from the past 240 hour(s)).   Hospital Course: This is a 83 year old who lives in a assisted living facility and who came to the emergency department because of shortness of breath. She was given a nebulizer treatment in the emergency department and her breathing improved. She was found to have evidence of urinary tract infection and was started on Rocephin. Her lactate level went up. She appeared to be dehydrated and had acute kidney injury. She did not appear to be septic. She was given IV fluids her acute kidney injury improved her lactate was better .  However she had increased shortness of breath with ambulation. She had echocardiogram that shows what appears to be chronic diastolic heart failure and it was felt that she probably had an acute exacerbation from fluid resuscitation. She received IV diuresis and improved. She is ready for transfer back to the assisted living facility  Discharge Exam: Blood pressure 122/61, pulse 81, temperature 98.1 F (36.7 C), temperature source Oral, resp. rate 20, height 5\' 2"  (1.575 m), weight 80.7 kg (178 lb), SpO2 96 %. She is awake and alert. Chest is clear. Heart is regular. Trace edema of her legs  Disposition: Return to assisted living facility she will be on oral antibiotic and she will have albuterol inhaler available. Continue oral diuresis      Signed: Aveah Castell L   10/28/2016, 8:39 AM

## 2016-10-28 NOTE — Progress Notes (Signed)
Subjective: She was brought into the hospital because of shortness of breath and wheezing. She was given a nebulizer treatment and improved. She says she feels well this morning. It looks like she has a urinary tract infection. She had elevated lactate level and that will need to be repeated.  Objective: Vital signs in last 24 hours: Temp:  [98 F (36.7 C)-98.2 F (36.8 C)] 98.1 F (36.7 C) (09/06 0400) Pulse Rate:  [78-105] 81 (09/06 0400) Resp:  [12-30] 20 (09/06 0400) BP: (91-151)/(49-65) 122/61 (09/06 0400) SpO2:  [95 %-100 %] 96 % (09/06 0400) Weight:  [80.7 kg (178 lb)-82.6 kg (182 lb)] 80.7 kg (178 lb) (09/05 2208) Weight change:     Intake/Output from previous day: 09/05 0701 - 09/06 0700 In: 945 [P.O.:120; I.V.:775; IV Piggyback:50] Out: 700 [Urine:700]  PHYSICAL EXAM General appearance: alert, cooperative and no distress Resp: clear to auscultation bilaterally Cardio: regular rate and rhythm, S1, S2 normal, no murmur, click, rub or gallop GI: soft, non-tender; bowel sounds normal; no masses,  no organomegaly Extremities: Trace edema which is much better than usual Skin warm and dry  Lab Results:  Results for orders placed or performed during the hospital encounter of 10/27/16 (from the past 48 hour(s))  Urinalysis, Routine w reflex microscopic     Status: Abnormal   Collection Time: 10/27/16  2:22 PM  Result Value Ref Range   Color, Urine STRAW (A) YELLOW   APPearance CLEAR CLEAR   Specific Gravity, Urine 1.004 (L) 1.005 - 1.030   pH 5.0 5.0 - 8.0   Glucose, UA NEGATIVE NEGATIVE mg/dL   Hgb urine dipstick SMALL (A) NEGATIVE   Bilirubin Urine NEGATIVE NEGATIVE   Ketones, ur NEGATIVE NEGATIVE mg/dL   Protein, ur NEGATIVE NEGATIVE mg/dL   Nitrite NEGATIVE NEGATIVE   Leukocytes, UA LARGE (A) NEGATIVE   RBC / HPF 0-5 0 - 5 RBC/hpf   WBC, UA TOO NUMEROUS TO COUNT 0 - 5 WBC/hpf   Bacteria, UA RARE (A) NONE SEEN   Squamous Epithelial / LPF 0-5 (A) NONE SEEN   Comprehensive metabolic panel     Status: Abnormal   Collection Time: 10/27/16  2:42 PM  Result Value Ref Range   Sodium 135 135 - 145 mmol/L   Potassium 3.9 3.5 - 5.1 mmol/L   Chloride 105 101 - 111 mmol/L   CO2 21 (L) 22 - 32 mmol/L   Glucose, Bld 161 (H) 65 - 99 mg/dL   BUN 26 (H) 6 - 20 mg/dL   Creatinine, Ser 1.46 (H) 0.44 - 1.00 mg/dL   Calcium 8.7 (L) 8.9 - 10.3 mg/dL   Total Protein 7.4 6.5 - 8.1 g/dL   Albumin 3.5 3.5 - 5.0 g/dL   AST 23 15 - 41 U/L   ALT 16 14 - 54 U/L   Alkaline Phosphatase 116 38 - 126 U/L   Total Bilirubin 0.3 0.3 - 1.2 mg/dL   GFR calc non Af Amer 29 (L) >60 mL/min   GFR calc Af Amer 34 (L) >60 mL/min    Comment: (NOTE) The eGFR has been calculated using the CKD EPI equation. This calculation has not been validated in all clinical situations. eGFR's persistently <60 mL/min signify possible Chronic Kidney Disease.    Anion gap 9 5 - 15  Brain natriuretic peptide     Status: None   Collection Time: 10/27/16  2:42 PM  Result Value Ref Range   B Natriuretic Peptide 54.0 0.0 - 100.0 pg/mL  Troponin I  Status: None   Collection Time: 10/27/16  2:42 PM  Result Value Ref Range   Troponin I <0.03 <0.03 ng/mL  Lactic acid, plasma     Status: None   Collection Time: 10/27/16  2:42 PM  Result Value Ref Range   Lactic Acid, Venous 1.5 0.5 - 1.9 mmol/L  CBC with Differential     Status: Abnormal   Collection Time: 10/27/16  2:42 PM  Result Value Ref Range   WBC 7.2 4.0 - 10.5 K/uL   RBC 3.34 (L) 3.87 - 5.11 MIL/uL   Hemoglobin 10.0 (L) 12.0 - 15.0 g/dL   HCT 30.2 (L) 36.0 - 46.0 %   MCV 90.4 78.0 - 100.0 fL   MCH 29.9 26.0 - 34.0 pg   MCHC 33.1 30.0 - 36.0 g/dL   RDW 14.5 11.5 - 15.5 %   Platelets 267 150 - 400 K/uL   Neutrophils Relative % 60 %   Neutro Abs 4.4 1.7 - 7.7 K/uL   Lymphocytes Relative 29 %   Lymphs Abs 2.1 0.7 - 4.0 K/uL   Monocytes Relative 9 %   Monocytes Absolute 0.7 0.1 - 1.0 K/uL   Eosinophils Relative 2 %    Eosinophils Absolute 0.1 0.0 - 0.7 K/uL   Basophils Relative 0 %   Basophils Absolute 0.0 0.0 - 0.1 K/uL  TSH     Status: Abnormal   Collection Time: 10/27/16  2:42 PM  Result Value Ref Range   TSH 6.076 (H) 0.350 - 4.500 uIU/mL    Comment: Performed by a 3rd Generation assay with a functional sensitivity of <=0.01 uIU/mL.  Lactic acid, plasma     Status: Abnormal   Collection Time: 10/27/16  7:26 PM  Result Value Ref Range   Lactic Acid, Venous 2.6 (HH) 0.5 - 1.9 mmol/L    Comment: CRITICAL RESULT CALLED TO, READ BACK BY AND VERIFIED WITH: NORMAN,B AT 2015 ON 9.5.2018 BY ISLEY,B   Basic metabolic panel     Status: Abnormal   Collection Time: 10/28/16  4:24 AM  Result Value Ref Range   Sodium 138 135 - 145 mmol/L   Potassium 3.9 3.5 - 5.1 mmol/L   Chloride 108 101 - 111 mmol/L   CO2 22 22 - 32 mmol/L   Glucose, Bld 117 (H) 65 - 99 mg/dL   BUN 23 (H) 6 - 20 mg/dL   Creatinine, Ser 1.16 (H) 0.44 - 1.00 mg/dL   Calcium 8.4 (L) 8.9 - 10.3 mg/dL   GFR calc non Af Amer 38 (L) >60 mL/min   GFR calc Af Amer 44 (L) >60 mL/min    Comment: (NOTE) The eGFR has been calculated using the CKD EPI equation. This calculation has not been validated in all clinical situations. eGFR's persistently <60 mL/min signify possible Chronic Kidney Disease.    Anion gap 8 5 - 15  CBC     Status: Abnormal   Collection Time: 10/28/16  4:24 AM  Result Value Ref Range   WBC 6.2 4.0 - 10.5 K/uL   RBC 3.30 (L) 3.87 - 5.11 MIL/uL   Hemoglobin 9.8 (L) 12.0 - 15.0 g/dL   HCT 29.7 (L) 36.0 - 46.0 %   MCV 90.0 78.0 - 100.0 fL   MCH 29.7 26.0 - 34.0 pg   MCHC 33.0 30.0 - 36.0 g/dL   RDW 14.4 11.5 - 15.5 %   Platelets 258 150 - 400 K/uL    ABGS No results for input(s): PHART, PO2ART, TCO2, HCO3 in the  last 72 hours.  Invalid input(s): PCO2 CULTURES No results found for this or any previous visit (from the past 240 hour(s)). Studies/Results: Ct Chest Wo Contrast  Result Date: 10/27/2016 CLINICAL DATA:   Persistent cough and shortness of breath today. EXAM: CT CHEST WITHOUT CONTRAST TECHNIQUE: Multidetector CT imaging of the chest was performed following the standard protocol without IV contrast. COMPARISON:  CT scan dated 04/05/2016 FINDINGS: Cardiovascular: Extensive aortic atherosclerosis. Extensive coronary artery calcifications. Overall heart size is normal. No pericardial effusion. Normal heart size. No pericardial effusion. Mediastinum/Nodes: No enlarged mediastinal or axillary lymph nodes. Thyroid gland and trachea demonstrate no significant findings. Small hiatal hernia. Lungs/Pleura: Slight atelectasis at the lung bases, right greater than left. No infiltrates or effusions. Upper Abdomen: Multiple bilateral renal cysts. Musculoskeletal: No chest wall mass or suspicious bone lesions identified. IMPRESSION: No acute abnormalities other than slight bibasilar atelectasis. Aortic atherosclerosis. Extensive coronary artery calcifications. Electronically Signed   By: Lorriane Shire M.D.   On: 10/27/2016 17:49   Dg Chest Port 1 View  Result Date: 10/27/2016 CLINICAL DATA:  Short of breath and wheezing EXAM: PORTABLE CHEST 1 VIEW COMPARISON:  07/05/2016 FINDINGS: The heart size and mediastinal contours are within normal limits. Both lungs are clear. The visualized skeletal structures are unremarkable. IMPRESSION: No active disease. Electronically Signed   By: Franchot Gallo M.D.   On: 10/27/2016 14:47    Medications:  Prior to Admission:  Prescriptions Prior to Admission  Medication Sig Dispense Refill Last Dose  . carboxymethylcellulose (REFRESH PLUS) 0.5 % SOLN Place 1 drop into both eyes 4 (four) times daily.   10/27/2016 at Unknown time  . ELIQUIS 5 MG TABS tablet Take 5 mg by mouth 2 (two) times daily.    10/26/2016 at 2100  . fluticasone (VERAMYST) 27.5 MCG/SPRAY nasal spray Place 1 spray into the nose daily as needed for rhinitis.   unknown  . furosemide (LASIX) 40 MG tablet Take 40 mg by mouth  daily.   10/27/2016 at Unknown time  . gabapentin (NEURONTIN) 300 MG capsule Take 600 mg by mouth at bedtime.   10/26/2016 at Unknown time  . gabapentin (NEURONTIN) 600 MG tablet Take 600 mg by mouth at bedtime.    10/26/2016 at Unknown time  . ketoconazole (NIZORAL) 2 % shampoo Apply 1 application topically once a week. As needed for antifungal.   unknown  . levothyroxine (SYNTHROID, LEVOTHROID) 88 MCG tablet Take 88 mcg by mouth daily before breakfast.   10/27/2016 at Unknown time  . LINZESS 290 MCG CAPS capsule Take 290 mcg by mouth daily before breakfast.    10/27/2016 at Unknown time  . NIFEdipine (PROCARDIA-XL/ADALAT CC) 30 MG 24 hr tablet Take 30 mg by mouth daily.   10/27/2016 at Unknown time  . omeprazole (PRILOSEC) 20 MG capsule Take 20 mg by mouth daily.   10/27/2016 at Unknown time  . oxybutynin (DITROPAN) 5 MG tablet Take 5 mg by mouth 2 (two) times daily.    10/27/2016 at Unknown time  . polyethylene glycol powder (GLYCOLAX/MIRALAX) powder Take 17 g by mouth daily.    10/27/2016 at Unknown time  . pravastatin (PRAVACHOL) 40 MG tablet Take 40 mg by mouth at bedtime.    10/26/2016 at Unknown time  . risperiDONE (RISPERDAL) 0.25 MG tablet Take 0.25 mg by mouth daily.   10/27/2016 at Unknown time  . risperiDONE (RISPERDAL) 0.5 MG tablet Take 0.5 mg by mouth at bedtime.    10/26/2016 at Unknown time  . rOPINIRole (REQUIP)  0.5 MG tablet Take 0.5 mg by mouth at bedtime.    10/26/2016 at Unknown time  . tobramycin-dexamethasone (TOBRADEX) ophthalmic ointment Place 1 application into both eyes 3 (three) times daily.   10/27/2016 at Unknown time  . traZODone (DESYREL) 50 MG tablet Take 25 mg by mouth at bedtime.   10/26/2016 at Unknown time   Scheduled: . apixaban  5 mg Oral BID  . fluticasone  1 spray Each Nare Daily  . gabapentin  600 mg Oral QHS  . levothyroxine  88 mcg Oral QAC breakfast  . linaclotide  290 mcg Oral QAC breakfast  . NIFEdipine  30 mg Oral Daily  . oxybutynin  5 mg Oral BID  . pantoprazole  40 mg  Oral Daily  . polyethylene glycol  17 g Oral Daily  . polyvinyl alcohol  1 drop Both Eyes QID  . pravastatin  40 mg Oral QHS  . risperiDONE  0.25 mg Oral Daily  . risperiDONE  0.5 mg Oral QHS  . rOPINIRole  0.5 mg Oral QHS  . sodium chloride flush  3 mL Intravenous Q12H  . traZODone  25 mg Oral QHS   Continuous: . sodium chloride 100 mL/hr at 10/27/16 2015  . cefTRIAXone (ROCEPHIN)  IV     RWE:[id number] **OR** acetaminophen  Assesment:She was admitted with dehydration, acute on chronic renal failure, urinary tract infection and anxiety. She had mild elevation of lactate which may be related to being dehydrated. This will be repeated. She says she feels much better. She had shortness of breath which is actually what led her to come to the emergency department but that has resolved. She is basically back at baseline and I think she can probably be transferred back to her assisted living facility on antibiotic provided that her lactate level etc. are better. Her renal function is better Principal Problem:   Acute on chronic renal failure [facility]) Active Problems:   Dehydration   Anxiety   Hypothyroidism   Acute lower UTI   AKI (acute kidney injury) ([place])    Plan: As above    LOS: 0 days   Christine Hancock L 10/28/2016, 8:23 AM

## 2016-10-28 NOTE — Care Management Obs Status (Signed)
[facility] NOTIFICATION   Patient Details  Name: Christine Hancock MRN: [id number] Date of Birth: 1917-11-29   Medicare Observation Status Notification Given:  Yes    Sherald Barge, RN 10/28/2016, 1:30 PM

## 2016-10-29 ENCOUNTER — Observation Stay (HOSPITAL_BASED_OUTPATIENT_CLINIC_OR_DEPARTMENT_OTHER): Payer: Medicare Other

## 2016-10-29 DIAGNOSIS — R0902 Hypoxemia: Secondary | ICD-10-CM | POA: Diagnosis not present

## 2016-10-29 DIAGNOSIS — R0600 Dyspnea, unspecified: Secondary | ICD-10-CM

## 2016-10-29 DIAGNOSIS — N390 Urinary tract infection, site not specified: Secondary | ICD-10-CM | POA: Diagnosis not present

## 2016-10-29 LAB — CBC WITH DIFFERENTIAL/PLATELET
Basophils Absolute: 0 K/uL (ref 0.0–0.1)
Basophils Relative: 0 %
Eosinophils Absolute: 0.1 K/uL (ref 0.0–0.7)
Eosinophils Relative: 2 %
HCT: 30.3 % — ABNORMAL LOW (ref 36.0–46.0)
Hemoglobin: 10 g/dL — ABNORMAL LOW (ref 12.0–15.0)
Lymphocytes Relative: 33 %
Lymphs Abs: 2.2 K/uL (ref 0.7–4.0)
MCH: 29.8 pg (ref 26.0–34.0)
MCHC: 33 g/dL (ref 30.0–36.0)
MCV: 90.2 fL (ref 78.0–100.0)
Monocytes Absolute: 0.7 K/uL (ref 0.1–1.0)
Monocytes Relative: 10 %
Neutro Abs: 3.6 K/uL (ref 1.7–7.7)
Neutrophils Relative %: 55 %
Platelets: 252 K/uL (ref 150–400)
RBC: 3.36 MIL/uL — ABNORMAL LOW (ref 3.87–5.11)
RDW: 14.5 % (ref 11.5–15.5)
WBC: 6.6 K/uL (ref 4.0–10.5)

## 2016-10-29 LAB — BASIC METABOLIC PANEL
Anion gap: 7 (ref 5–15)
BUN: 20 mg/dL (ref 6–20)
CALCIUM: 8.5 mg/dL — AB (ref 8.9–10.3)
CO2: 21 mmol/L — ABNORMAL LOW (ref 22–32)
Chloride: 109 mmol/L (ref 101–111)
Creatinine, Ser: 1.1 mg/dL — ABNORMAL HIGH (ref 0.44–1.00)
GFR calc Af Amer: 47 mL/min — ABNORMAL LOW (ref >60)
GFR, EST NON AFRICAN AMERICAN: 41 mL/min — AB (ref >60)
GLUCOSE: 114 mg/dL — AB (ref 65–99)
POTASSIUM: 3.8 mmol/L (ref 3.5–5.1)
Sodium: 137 mmol/L (ref 135–145)

## 2016-10-29 LAB — ECHOCARDIOGRAM COMPLETE
Height: 62 in
Weight: 2848 [oz_av]

## 2016-10-29 LAB — LACTIC ACID, PLASMA: Lactic Acid, Venous: 1.3 mmol/L (ref 0.5–1.9)

## 2016-10-29 MED ORDER — IPRATROPIUM-ALBUTEROL 0.5-2.5 (3) MG/3ML IN SOLN
3.0000 mL | RESPIRATORY_TRACT | Status: DC
  Administered 2016-10-29 – 2016-10-30 (×6): 3 mL via RESPIRATORY_TRACT
  Filled 2016-10-29 (×5): qty 3

## 2016-10-29 MED ORDER — FUROSEMIDE 10 MG/ML IJ SOLN
40.0000 mg | Freq: Once | INTRAMUSCULAR | Status: AC
  Administered 2016-10-29: 40 mg via INTRAVENOUS
  Filled 2016-10-29: qty 4

## 2016-10-29 NOTE — Progress Notes (Signed)
Subjective: I thought she might be able to return to her assisted living facility yesterday but she still had an elevated lactate level so she was held. She did get up and move around yesterday and had significant shortness of breath. She has been having a lot of swelling of her lower extremities which had been thought to be due to venous stasis but with her shortness of breath she'll need workup for heart failure. She has no complaints this morning. She says she wants to go home.  Objective: Vital signs in last 24 hours: Temp:  [98 F (36.7 C)-98.2 F (36.8 C)] 98.2 F (36.8 C) (09/07 0452) Pulse Rate:  [84-93] 84 (09/07 0452) Resp:  [20] 20 (09/07 0452) BP: (122-132)/(57-72) 122/57 (09/07 0452) SpO2:  [91 %-97 %] 91 % (09/07 0452) Weight change:     Intake/Output from previous day: 09/06 0701 - 09/07 0700 In: 2710 [P.O.:360; I.V.:2300; IV Piggyback:50] Out: 1100 [Urine:1100]  PHYSICAL EXAM General appearance: alert, cooperative and mild distress Resp: clear to auscultation bilaterally Cardio: regular rate and rhythm, S1, S2 normal, no murmur, click, rub or gallop GI: soft, non-tender; bowel sounds normal; no masses,  no organomegaly Extremities: She has bilateral trace edema Skin warm and dry  Lab Results:  Results for orders placed or performed during the hospital encounter of 10/27/16 (from the past 48 hour(s))  Urinalysis, Routine w reflex microscopic     Status: Abnormal   Collection Time: 10/27/16  2:22 PM  Result Value Ref Range   Color, Urine STRAW (A) YELLOW   APPearance CLEAR CLEAR   Specific Gravity, Urine 1.004 (L) 1.005 - 1.030   pH 5.0 5.0 - 8.0   Glucose, UA NEGATIVE NEGATIVE mg/dL   Hgb urine dipstick SMALL (A) NEGATIVE   Bilirubin Urine NEGATIVE NEGATIVE   Ketones, ur NEGATIVE NEGATIVE mg/dL   Protein, ur NEGATIVE NEGATIVE mg/dL   Nitrite NEGATIVE NEGATIVE   Leukocytes, UA LARGE (A) NEGATIVE   RBC / HPF 0-5 0 - 5 RBC/hpf   WBC, UA TOO NUMEROUS TO COUNT  0 - 5 WBC/hpf   Bacteria, UA RARE (A) NONE SEEN   Squamous Epithelial / LPF 0-5 (A) NONE SEEN  Comprehensive metabolic panel     Status: Abnormal   Collection Time: 10/27/16  2:42 PM  Result Value Ref Range   Sodium 135 135 - 145 mmol/L   Potassium 3.9 3.5 - 5.1 mmol/L   Chloride 105 101 - 111 mmol/L   CO2 21 (L) 22 - 32 mmol/L   Glucose, Bld 161 (H) 65 - 99 mg/dL   BUN 26 (H) 6 - 20 mg/dL   Creatinine, Ser 1.46 (H) 0.44 - 1.00 mg/dL   Calcium 8.7 (L) 8.9 - 10.3 mg/dL   Total Protein 7.4 6.5 - 8.1 g/dL   Albumin 3.5 3.5 - 5.0 g/dL   AST 23 15 - 41 U/L   ALT 16 14 - 54 U/L   Alkaline Phosphatase 116 38 - 126 U/L   Total Bilirubin 0.3 0.3 - 1.2 mg/dL   GFR calc non Af Amer 29 (L) >60 mL/min   GFR calc Af Amer 34 (L) >60 mL/min    Comment: (NOTE) The eGFR has been calculated using the CKD EPI equation. This calculation has not been validated in all clinical situations. eGFR's persistently <60 mL/min signify possible Chronic Kidney Disease.    Anion gap 9 5 - 15  Brain natriuretic peptide     Status: None   Collection Time: 10/27/16  2:42 PM  Result Value Ref Range   B Natriuretic Peptide 54.0 0.0 - 100.0 pg/mL  Troponin I     Status: None   Collection Time: 10/27/16  2:42 PM  Result Value Ref Range   Troponin I <0.03 <0.03 ng/mL  Lactic acid, plasma     Status: None   Collection Time: 10/27/16  2:42 PM  Result Value Ref Range   Lactic Acid, Venous 1.5 0.5 - 1.9 mmol/L  CBC with Differential     Status: Abnormal   Collection Time: 10/27/16  2:42 PM  Result Value Ref Range   WBC 7.2 4.0 - 10.5 K/uL   RBC 3.34 (L) 3.87 - 5.11 MIL/uL   Hemoglobin 10.0 (L) 12.0 - 15.0 g/dL   HCT 30.2 (L) 36.0 - 46.0 %   MCV 90.4 78.0 - 100.0 fL   MCH 29.9 26.0 - 34.0 pg   MCHC 33.1 30.0 - 36.0 g/dL   RDW 14.5 11.5 - 15.5 %   Platelets 267 150 - 400 K/uL   Neutrophils Relative % 60 %   Neutro Abs 4.4 1.7 - 7.7 K/uL   Lymphocytes Relative 29 %   Lymphs Abs 2.1 0.7 - 4.0 K/uL   Monocytes  Relative 9 %   Monocytes Absolute 0.7 0.1 - 1.0 K/uL   Eosinophils Relative 2 %   Eosinophils Absolute 0.1 0.0 - 0.7 K/uL   Basophils Relative 0 %   Basophils Absolute 0.0 0.0 - 0.1 K/uL  TSH     Status: Abnormal   Collection Time: 10/27/16  2:42 PM  Result Value Ref Range   TSH 6.076 (H) 0.350 - 4.500 uIU/mL    Comment: Performed by a 3rd Generation assay with a functional sensitivity of <=0.01 uIU/mL.  Lactic acid, plasma     Status: Abnormal   Collection Time: 10/27/16  7:26 PM  Result Value Ref Range   Lactic Acid, Venous 2.6 (HH) 0.5 - 1.9 mmol/L    Comment: CRITICAL RESULT CALLED TO, READ BACK BY AND VERIFIED WITH: NORMAN,B AT 2015 ON 9.5.2018 BY ISLEY,B   Basic metabolic panel     Status: Abnormal   Collection Time: 10/28/16  4:24 AM  Result Value Ref Range   Sodium 138 135 - 145 mmol/L   Potassium 3.9 3.5 - 5.1 mmol/L   Chloride 108 101 - 111 mmol/L   CO2 22 22 - 32 mmol/L   Glucose, Bld 117 (H) 65 - 99 mg/dL   BUN 23 (H) 6 - 20 mg/dL   Creatinine, Ser 1.16 (H) 0.44 - 1.00 mg/dL   Calcium 8.4 (L) 8.9 - 10.3 mg/dL   GFR calc non Af Amer 38 (L) >60 mL/min   GFR calc Af Amer 44 (L) >60 mL/min    Comment: (NOTE) The eGFR has been calculated using the CKD EPI equation. This calculation has not been validated in all clinical situations. eGFR's persistently <60 mL/min signify possible Chronic Kidney Disease.    Anion gap 8 5 - 15  CBC     Status: Abnormal   Collection Time: 10/28/16  4:24 AM  Result Value Ref Range   WBC 6.2 4.0 - 10.5 K/uL   RBC 3.30 (L) 3.87 - 5.11 MIL/uL   Hemoglobin 9.8 (L) 12.0 - 15.0 g/dL   HCT 29.7 (L) 36.0 - 46.0 %   MCV 90.0 78.0 - 100.0 fL   MCH 29.7 26.0 - 34.0 pg   MCHC 33.0 30.0 - 36.0 g/dL   RDW 14.4 11.5 -  15.5 %   Platelets 258 150 - 400 K/uL  Lactic acid, plasma     Status: Abnormal   Collection Time: 10/28/16  9:40 AM  Result Value Ref Range   Lactic Acid, Venous 2.2 (HH) 0.5 - 1.9 mmol/L    Comment: CRITICAL RESULT CALLED TO,  READ BACK BY AND VERIFIED WITH: DISCHOMON M. @ 1044 ON [id number] BY HENDERSON L.   CBC with Differential/Platelet     Status: Abnormal   Collection Time: 10/29/16  4:16 AM  Result Value Ref Range   WBC 6.6 4.0 - 10.5 K/uL   RBC 3.36 (L) 3.87 - 5.11 MIL/uL   Hemoglobin 10.0 (L) 12.0 - 15.0 g/dL   HCT 30.3 (L) 36.0 - 46.0 %   MCV 90.2 78.0 - 100.0 fL   MCH 29.8 26.0 - 34.0 pg   MCHC 33.0 30.0 - 36.0 g/dL   RDW 14.5 11.5 - 15.5 %   Platelets 252 150 - 400 K/uL   Neutrophils Relative % 55 %   Neutro Abs 3.6 1.7 - 7.7 K/uL   Lymphocytes Relative 33 %   Lymphs Abs 2.2 0.7 - 4.0 K/uL   Monocytes Relative 10 %   Monocytes Absolute 0.7 0.1 - 1.0 K/uL   Eosinophils Relative 2 %   Eosinophils Absolute 0.1 0.0 - 0.7 K/uL   Basophils Relative 0 %   Basophils Absolute 0.0 0.0 - 0.1 K/uL  Basic metabolic panel     Status: Abnormal   Collection Time: 10/29/16  4:16 AM  Result Value Ref Range   Sodium 137 135 - 145 mmol/L   Potassium 3.8 3.5 - 5.1 mmol/L   Chloride 109 101 - 111 mmol/L   CO2 21 (L) 22 - 32 mmol/L   Glucose, Bld 114 (H) 65 - 99 mg/dL   BUN 20 6 - 20 mg/dL   Creatinine, Ser 1.10 (H) 0.44 - 1.00 mg/dL   Calcium 8.5 (L) 8.9 - 10.3 mg/dL   GFR calc non Af Amer 41 (L) >60 mL/min   GFR calc Af Amer 47 (L) >60 mL/min    Comment: (NOTE) The eGFR has been calculated using the CKD EPI equation. This calculation has not been validated in all clinical situations. eGFR's persistently <60 mL/min signify possible Chronic Kidney Disease.    Anion gap 7 5 - 15  Lactic acid, plasma     Status: None   Collection Time: 10/29/16  4:16 AM  Result Value Ref Range   Lactic Acid, Venous 1.3 0.5 - 1.9 mmol/L    ABGS No results for input(s): PHART, PO2ART, TCO2, HCO3 in the last 72 hours.  Invalid input(s): PCO2 CULTURES No results found for this or any previous visit (from the past 240 hour(s)). Studies/Results: Ct Chest Wo Contrast  Result Date: 10/27/2016 CLINICAL DATA:  Persistent  cough and shortness of breath today. EXAM: CT CHEST WITHOUT CONTRAST TECHNIQUE: Multidetector CT imaging of the chest was performed following the standard protocol without IV contrast. COMPARISON:  CT scan dated 04/05/2016 FINDINGS: Cardiovascular: Extensive aortic atherosclerosis. Extensive coronary artery calcifications. Overall heart size is normal. No pericardial effusion. Normal heart size. No pericardial effusion. Mediastinum/Nodes: No enlarged mediastinal or axillary lymph nodes. Thyroid gland and trachea demonstrate no significant findings. Small hiatal hernia. Lungs/Pleura: Slight atelectasis at the lung bases, right greater than left. No infiltrates or effusions. Upper Abdomen: Multiple bilateral renal cysts. Musculoskeletal: No chest wall mass or suspicious bone lesions identified. IMPRESSION: No acute abnormalities other than slight bibasilar atelectasis. Aortic atherosclerosis.  Extensive coronary artery calcifications. Electronically Signed   By: Lorriane Shire M.D.   On: 10/27/2016 17:49   Dg Chest Port 1 View  Result Date: 10/27/2016 CLINICAL DATA:  Short of breath and wheezing EXAM: PORTABLE CHEST 1 VIEW COMPARISON:  07/05/2016 FINDINGS: The heart size and mediastinal contours are within normal limits. Both lungs are clear. The visualized skeletal structures are unremarkable. IMPRESSION: No active disease. Electronically Signed   By: Franchot Gallo M.D.   On: 10/27/2016 14:47    Medications:  Prior to Admission:  Prescriptions Prior to Admission  Medication Sig Dispense Refill Last Dose  . carboxymethylcellulose (REFRESH PLUS) 0.5 % SOLN Place 1 drop into both eyes 4 (four) times daily.   10/27/2016 at Unknown time  . ELIQUIS 5 MG TABS tablet Take 5 mg by mouth 2 (two) times daily.    10/26/2016 at 2100  . fluticasone (VERAMYST) 27.5 MCG/SPRAY nasal spray Place 1 spray into the nose daily as needed for rhinitis.   unknown  . furosemide (LASIX) 40 MG tablet Take 40 mg by mouth daily.    10/27/2016 at Unknown time  . gabapentin (NEURONTIN) 300 MG capsule Take 600 mg by mouth at bedtime.   10/26/2016 at Unknown time  . gabapentin (NEURONTIN) 600 MG tablet Take 600 mg by mouth at bedtime.    10/26/2016 at Unknown time  . ketoconazole (NIZORAL) 2 % shampoo Apply 1 application topically once a week. As needed for antifungal.   unknown  . levothyroxine (SYNTHROID, LEVOTHROID) 88 MCG tablet Take 88 mcg by mouth daily before breakfast.   10/27/2016 at Unknown time  . LINZESS 290 MCG CAPS capsule Take 290 mcg by mouth daily before breakfast.    10/27/2016 at Unknown time  . NIFEdipine (PROCARDIA-XL/ADALAT CC) 30 MG 24 hr tablet Take 30 mg by mouth daily.   10/27/2016 at Unknown time  . omeprazole (PRILOSEC) 20 MG capsule Take 20 mg by mouth daily.   10/27/2016 at Unknown time  . oxybutynin (DITROPAN) 5 MG tablet Take 5 mg by mouth 2 (two) times daily.    10/27/2016 at Unknown time  . polyethylene glycol powder (GLYCOLAX/MIRALAX) powder Take 17 g by mouth daily.    10/27/2016 at Unknown time  . pravastatin (PRAVACHOL) 40 MG tablet Take 40 mg by mouth at bedtime.    10/26/2016 at Unknown time  . risperiDONE (RISPERDAL) 0.25 MG tablet Take 0.25 mg by mouth daily.   10/27/2016 at Unknown time  . risperiDONE (RISPERDAL) 0.5 MG tablet Take 0.5 mg by mouth at bedtime.    10/26/2016 at Unknown time  . rOPINIRole (REQUIP) 0.5 MG tablet Take 0.5 mg by mouth at bedtime.    10/26/2016 at Unknown time  . tobramycin-dexamethasone (TOBRADEX) ophthalmic ointment Place 1 application into both eyes 3 (three) times daily.   10/27/2016 at Unknown time  . traZODone (DESYREL) 50 MG tablet Take 25 mg by mouth at bedtime.   10/26/2016 at Unknown time   Scheduled: . apixaban  5 mg Oral BID  . fluticasone  1 spray Each Nare Daily  . gabapentin  600 mg Oral QHS  . ipratropium-albuterol  3 mL Nebulization Q4H  . levothyroxine  88 mcg Oral QAC breakfast  . linaclotide  290 mcg Oral QAC breakfast  . NIFEdipine  30 mg Oral Daily  . oxybutynin   5 mg Oral BID  . pantoprazole  40 mg Oral Daily  . polyethylene glycol  17 g Oral Daily  . polyvinyl alcohol  1 drop  Both Eyes QID  . pravastatin  40 mg Oral QHS  . risperiDONE  0.25 mg Oral Daily  . risperiDONE  0.5 mg Oral QHS  . rOPINIRole  0.5 mg Oral QHS  . sodium chloride flush  3 mL Intravenous Q12H  . traZODone  25 mg Oral QHS   Continuous: . sodium chloride 10 mL/hr at 10/29/16 0738  . cefTRIAXone (ROCEPHIN)  IV Stopped (10/28/16 1740)   UJW:[id number] **OR** acetaminophen  Assesment:She came to the hospital with shortness of breath. That seemed to resolve but she had more trouble with it again yesterday. She is on antibiotics for urinary tract infection. She had acute kidney injury which has resolved. She was dehydrated which has resolved. She was more short of breath yesterday so she's going to have echocardiogram. Discontinue IV fluids. Give her a dose of IV Lasix. Nebulizer treatments. Principal Problem:   Acute on chronic renal failure (HCC) Active Problems:   Dehydration   Anxiety   Hypothyroidism   Acute lower UTI   AKI (acute kidney injury) ([place])    Plan: As above delay discharge back to assisted living facility until at least tomorrow    LOS: 0 days   Chandell Attridge L 10/29/2016, 8:24 AM

## 2016-10-29 NOTE — Progress Notes (Signed)
*  PRELIMINARY RESULTS* Echocardiogram 2D Echocardiogram has been performed.  Leavy Cella 10/29/2016, 11:21 AM

## 2016-10-30 DIAGNOSIS — I5033 Acute on chronic diastolic (congestive) heart failure: Secondary | ICD-10-CM | POA: Diagnosis not present

## 2016-10-30 DIAGNOSIS — R0902 Hypoxemia: Secondary | ICD-10-CM | POA: Diagnosis not present

## 2016-10-30 DIAGNOSIS — N390 Urinary tract infection, site not specified: Secondary | ICD-10-CM | POA: Diagnosis not present

## 2016-10-30 LAB — URINE CULTURE: Culture: >=100000 — AB

## 2016-10-30 MED ORDER — IPRATROPIUM-ALBUTEROL 0.5-2.5 (3) MG/3ML IN SOLN: 3.0000 mL | Freq: Three times a day (TID) | RESPIRATORY_TRACT | Status: DC

## 2016-10-30 NOTE — Progress Notes (Signed)
Subjective: She says she feels okay. No new complaints. She received IV Lasix yesterday and she says her breathing is better. She had echocardiogram yesterday that shows diastolic dysfunction  Objective: Vital signs in last 24 hours: Temp:  [98 F (36.7 C)-98.6 F (37 C)] 98.6 F (37 C) (09/08 0539) Pulse Rate:  [87-93] 87 (09/08 0539) Resp:  [18] 18 (09/08 0539) BP: (122-155)/(53-78) 122/53 (09/08 0539) SpO2:  [93 %-96 %] 93 % (09/08 0753) Weight change:  Last BM Date: 10/29/16  Intake/Output from previous day: 09/07 0701 - 09/08 0700 In: 977 [P.O.:360; I.V.:567; IV Piggyback:50] Out: 1700 [Urine:1700]  PHYSICAL EXAM General appearance: alert, cooperative and no distress Resp: clear to auscultation bilaterally Cardio: regular rate and rhythm, S1, S2 normal, no murmur, click, rub or gallop GI: soft, non-tender; bowel sounds normal; no masses,  no organomegaly Extremities: extremities normal, atraumatic, no cyanosis or edema Skin warm and dry  Lab Results:  Results for orders placed or performed during the hospital encounter of 10/27/16 (from the past 48 hour(s))  Lactic acid, plasma     Status: Abnormal   Collection Time: 10/28/16  9:40 AM  Result Value Ref Range   Lactic Acid, Venous 2.2 (HH) 0.5 - 1.9 mmol/L    Comment: CRITICAL RESULT CALLED TO, READ BACK BY AND VERIFIED WITH: DISCHOMON M. @ 1044 ON [id number] BY HENDERSON L.   CBC with Differential/Platelet     Status: Abnormal   Collection Time: 10/29/16  4:16 AM  Result Value Ref Range   WBC 6.6 4.0 - 10.5 K/uL   RBC 3.36 (L) 3.87 - 5.11 MIL/uL   Hemoglobin 10.0 (L) 12.0 - 15.0 g/dL   HCT 30.3 (L) 36.0 - 46.0 %   MCV 90.2 78.0 - 100.0 fL   MCH 29.8 26.0 - 34.0 pg   MCHC 33.0 30.0 - 36.0 g/dL   RDW 14.5 11.5 - 15.5 %   Platelets 252 150 - 400 K/uL   Neutrophils Relative % 55 %   Neutro Abs 3.6 1.7 - 7.7 K/uL   Lymphocytes Relative 33 %   Lymphs Abs 2.2 0.7 - 4.0 K/uL   Monocytes Relative 10 %   Monocytes  Absolute 0.7 0.1 - 1.0 K/uL   Eosinophils Relative 2 %   Eosinophils Absolute 0.1 0.0 - 0.7 K/uL   Basophils Relative 0 %   Basophils Absolute 0.0 0.0 - 0.1 K/uL  Basic metabolic panel     Status: Abnormal   Collection Time: 10/29/16  4:16 AM  Result Value Ref Range   Sodium 137 135 - 145 mmol/L   Potassium 3.8 3.5 - 5.1 mmol/L   Chloride 109 101 - 111 mmol/L   CO2 21 (L) 22 - 32 mmol/L   Glucose, Bld 114 (H) 65 - 99 mg/dL   BUN 20 6 - 20 mg/dL   Creatinine, Ser 1.10 (H) 0.44 - 1.00 mg/dL   Calcium 8.5 (L) 8.9 - 10.3 mg/dL   GFR calc non Af Amer 41 (L) >60 mL/min   GFR calc Af Amer 47 (L) >60 mL/min    Comment: (NOTE) The eGFR has been calculated using the CKD EPI equation. This calculation has not been validated in all clinical situations. eGFR's persistently <60 mL/min signify possible Chronic Kidney Disease.    Anion gap 7 5 - 15  Lactic acid, plasma     Status: None   Collection Time: 10/29/16  4:16 AM  Result Value Ref Range   Lactic Acid, Venous 1.3 0.5 - 1.9  mmol/L    ABGS No results for input(s): PHART, PO2ART, TCO2, HCO3 in the last 72 hours.  Invalid input(s): PCO2 CULTURES Recent Results (from the past 240 hour(s))  Urine culture     Status: Abnormal   Collection Time: 10/27/16  2:23 PM  Result Value Ref Range Status   Specimen Description URINE, RANDOM  Final   Special Requests NONE  Final   Culture >=100,000 COLONIES/mL CITROBACTER FREUNDII (A)  Final   Report Status 10/30/2016 FINAL  Final   Organism ID, Bacteria CITROBACTER FREUNDII (A)  Final      Susceptibility   Citrobacter freundii - MIC*    CEFAZOLIN >=64 RESISTANT Resistant     CEFTRIAXONE <=1 SENSITIVE Sensitive     CIPROFLOXACIN <=0.25 SENSITIVE Sensitive     GENTAMICIN <=1 SENSITIVE Sensitive     IMIPENEM <=0.25 SENSITIVE Sensitive     NITROFURANTOIN <=16 SENSITIVE Sensitive     TRIMETH/SULFA <=20 SENSITIVE Sensitive     PIP/TAZO <=4 SENSITIVE Sensitive     * >=100,000 COLONIES/mL  CITROBACTER FREUNDII   Studies/Results: No results found.  Medications:  Prior to Admission:  Prescriptions Prior to Admission  Medication Sig Dispense Refill Last Dose  . carboxymethylcellulose (REFRESH PLUS) 0.5 % SOLN Place 1 drop into both eyes 4 (four) times daily.   10/27/2016 at Unknown time  . ELIQUIS 5 MG TABS tablet Take 5 mg by mouth 2 (two) times daily.    10/26/2016 at 2100  . fluticasone (VERAMYST) 27.5 MCG/SPRAY nasal spray Place 1 spray into the nose daily as needed for rhinitis.   unknown  . furosemide (LASIX) 40 MG tablet Take 40 mg by mouth daily.   10/27/2016 at Unknown time  . gabapentin (NEURONTIN) 300 MG capsule Take 600 mg by mouth at bedtime.   10/26/2016 at Unknown time  . gabapentin (NEURONTIN) 600 MG tablet Take 600 mg by mouth at bedtime.    10/26/2016 at Unknown time  . ketoconazole (NIZORAL) 2 % shampoo Apply 1 application topically once a week. As needed for antifungal.   unknown  . levothyroxine (SYNTHROID, LEVOTHROID) 88 MCG tablet Take 88 mcg by mouth daily before breakfast.   10/27/2016 at Unknown time  . LINZESS 290 MCG CAPS capsule Take 290 mcg by mouth daily before breakfast.    10/27/2016 at Unknown time  . NIFEdipine (PROCARDIA-XL/ADALAT CC) 30 MG 24 hr tablet Take 30 mg by mouth daily.   10/27/2016 at Unknown time  . omeprazole (PRILOSEC) 20 MG capsule Take 20 mg by mouth daily.   10/27/2016 at Unknown time  . oxybutynin (DITROPAN) 5 MG tablet Take 5 mg by mouth 2 (two) times daily.    10/27/2016 at Unknown time  . polyethylene glycol powder (GLYCOLAX/MIRALAX) powder Take 17 g by mouth daily.    10/27/2016 at Unknown time  . pravastatin (PRAVACHOL) 40 MG tablet Take 40 mg by mouth at bedtime.    10/26/2016 at Unknown time  . risperiDONE (RISPERDAL) 0.25 MG tablet Take 0.25 mg by mouth daily.   10/27/2016 at Unknown time  . risperiDONE (RISPERDAL) 0.5 MG tablet Take 0.5 mg by mouth at bedtime.    10/26/2016 at Unknown time  . rOPINIRole (REQUIP) 0.5 MG tablet Take 0.5 mg by  mouth at bedtime.    10/26/2016 at Unknown time  . tobramycin-dexamethasone (TOBRADEX) ophthalmic ointment Place 1 application into both eyes 3 (three) times daily.   10/27/2016 at Unknown time  . traZODone (DESYREL) 50 MG tablet Take 25 mg by mouth at  bedtime.   10/26/2016 at Unknown time   Scheduled: . apixaban  5 mg Oral BID  . fluticasone  1 spray Each Nare Daily  . gabapentin  600 mg Oral QHS  . ipratropium-albuterol  3 mL Nebulization TID  . levothyroxine  88 mcg Oral QAC breakfast  . linaclotide  290 mcg Oral QAC breakfast  . NIFEdipine  30 mg Oral Daily  . oxybutynin  5 mg Oral BID  . pantoprazole  40 mg Oral Daily  . polyethylene glycol  17 g Oral Daily  . polyvinyl alcohol  1 drop Both Eyes QID  . pravastatin  40 mg Oral QHS  . risperiDONE  0.25 mg Oral Daily  . risperiDONE  0.5 mg Oral QHS  . rOPINIRole  0.5 mg Oral QHS  . sodium chloride flush  3 mL Intravenous Q12H  . traZODone  25 mg Oral QHS   Continuous: . sodium chloride 10 mL/hr at 10/29/16 0738  . cefTRIAXone (ROCEPHIN)  IV Stopped (10/29/16 1730)   LPF:[id number] **OR** acetaminophen  Assesment:She was admitted with urinary tract infection and that is being adequately treated with current medication. She had acute on chronic renal failure and dehydration. At baseline she has what appears to be chronic diastolic heart failure and she received IV Lasix yesterday. She may have had something of an acute exacerbation with her increased shortness of breath. That seems better today. At baseline she has pretty severe anxiety. At baseline she has chronic venous stasis and chronic swelling of her legs which is better. She has hypothyroidism at baseline on treatment Principal Problem:   Acute on chronic renal failure (HCC) Active Problems:   Dehydration   Anxiety   Hypothyroidism   Acute lower UTI   AKI (acute kidney injury) ([place])    Plan: I'm going to have her get up and ambulate and if she does well with that she  should be able to be discharged    LOS: 0 days   , L 10/30/2016, 8:40 AM

## 2016-10-30 NOTE — Progress Notes (Signed)
Pt ambulated 150 ft taking one break.  O2 sat at rest before walk, 98%.  Lowest sat during walk, 88%, but mostly maintained in the mid 90s.  Sat at rest after walk 97%.  Pt tolerated well.

## 2016-10-30 NOTE — Progress Notes (Signed)
Report called to [place] at [place].

## 2016-11-04 DIAGNOSIS — Z7901 Long term (current) use of anticoagulants: Secondary | ICD-10-CM | POA: Diagnosis not present

## 2016-11-04 DIAGNOSIS — I110 Hypertensive heart disease with heart failure: Secondary | ICD-10-CM | POA: Diagnosis not present

## 2016-11-04 DIAGNOSIS — I5032 Chronic diastolic (congestive) heart failure: Secondary | ICD-10-CM | POA: Diagnosis not present

## 2016-11-04 DIAGNOSIS — G589 Mononeuropathy, unspecified: Secondary | ICD-10-CM | POA: Diagnosis not present

## 2016-11-04 DIAGNOSIS — I809 Phlebitis and thrombophlebitis of unspecified site: Secondary | ICD-10-CM | POA: Diagnosis not present

## 2016-11-04 DIAGNOSIS — Z87440 Personal history of urinary (tract) infections: Secondary | ICD-10-CM | POA: Diagnosis not present

## 2016-11-09 DIAGNOSIS — I809 Phlebitis and thrombophlebitis of unspecified site: Secondary | ICD-10-CM | POA: Diagnosis not present

## 2016-11-09 DIAGNOSIS — Z7901 Long term (current) use of anticoagulants: Secondary | ICD-10-CM | POA: Diagnosis not present

## 2016-11-09 DIAGNOSIS — I5032 Chronic diastolic (congestive) heart failure: Secondary | ICD-10-CM | POA: Diagnosis not present

## 2016-11-09 DIAGNOSIS — I110 Hypertensive heart disease with heart failure: Secondary | ICD-10-CM | POA: Diagnosis not present

## 2016-11-09 DIAGNOSIS — G589 Mononeuropathy, unspecified: Secondary | ICD-10-CM | POA: Diagnosis not present

## 2016-11-09 DIAGNOSIS — Z87440 Personal history of urinary (tract) infections: Secondary | ICD-10-CM | POA: Diagnosis not present

## 2016-11-12 DIAGNOSIS — Z7901 Long term (current) use of anticoagulants: Secondary | ICD-10-CM | POA: Diagnosis not present

## 2016-11-12 DIAGNOSIS — I110 Hypertensive heart disease with heart failure: Secondary | ICD-10-CM | POA: Diagnosis not present

## 2016-11-12 DIAGNOSIS — I809 Phlebitis and thrombophlebitis of unspecified site: Secondary | ICD-10-CM | POA: Diagnosis not present

## 2016-11-12 DIAGNOSIS — Z87440 Personal history of urinary (tract) infections: Secondary | ICD-10-CM | POA: Diagnosis not present

## 2016-11-12 DIAGNOSIS — G589 Mononeuropathy, unspecified: Secondary | ICD-10-CM | POA: Diagnosis not present

## 2016-11-12 DIAGNOSIS — I5032 Chronic diastolic (congestive) heart failure: Secondary | ICD-10-CM | POA: Diagnosis not present

## 2016-11-15 DIAGNOSIS — Z7901 Long term (current) use of anticoagulants: Secondary | ICD-10-CM | POA: Diagnosis not present

## 2016-11-15 DIAGNOSIS — G589 Mononeuropathy, unspecified: Secondary | ICD-10-CM | POA: Diagnosis not present

## 2016-11-15 DIAGNOSIS — I110 Hypertensive heart disease with heart failure: Secondary | ICD-10-CM | POA: Diagnosis not present

## 2016-11-15 DIAGNOSIS — Z87440 Personal history of urinary (tract) infections: Secondary | ICD-10-CM | POA: Diagnosis not present

## 2016-11-15 DIAGNOSIS — I809 Phlebitis and thrombophlebitis of unspecified site: Secondary | ICD-10-CM | POA: Diagnosis not present

## 2016-11-15 DIAGNOSIS — I5032 Chronic diastolic (congestive) heart failure: Secondary | ICD-10-CM | POA: Diagnosis not present

## 2016-11-17 DIAGNOSIS — I110 Hypertensive heart disease with heart failure: Secondary | ICD-10-CM | POA: Diagnosis not present

## 2016-11-17 DIAGNOSIS — Z7901 Long term (current) use of anticoagulants: Secondary | ICD-10-CM | POA: Diagnosis not present

## 2016-11-17 DIAGNOSIS — Z87440 Personal history of urinary (tract) infections: Secondary | ICD-10-CM | POA: Diagnosis not present

## 2016-11-17 DIAGNOSIS — I809 Phlebitis and thrombophlebitis of unspecified site: Secondary | ICD-10-CM | POA: Diagnosis not present

## 2016-11-17 DIAGNOSIS — G589 Mononeuropathy, unspecified: Secondary | ICD-10-CM | POA: Diagnosis not present

## 2016-11-17 DIAGNOSIS — I5032 Chronic diastolic (congestive) heart failure: Secondary | ICD-10-CM | POA: Diagnosis not present

## 2016-11-22 DIAGNOSIS — I739 Peripheral vascular disease, unspecified: Secondary | ICD-10-CM | POA: Diagnosis not present

## 2016-11-23 DIAGNOSIS — G589 Mononeuropathy, unspecified: Secondary | ICD-10-CM | POA: Diagnosis not present

## 2016-11-23 DIAGNOSIS — I5032 Chronic diastolic (congestive) heart failure: Secondary | ICD-10-CM | POA: Diagnosis not present

## 2016-11-23 DIAGNOSIS — I110 Hypertensive heart disease with heart failure: Secondary | ICD-10-CM | POA: Diagnosis not present

## 2016-11-23 DIAGNOSIS — Z87440 Personal history of urinary (tract) infections: Secondary | ICD-10-CM | POA: Diagnosis not present

## 2016-11-23 DIAGNOSIS — Z7901 Long term (current) use of anticoagulants: Secondary | ICD-10-CM | POA: Diagnosis not present

## 2016-11-23 DIAGNOSIS — I809 Phlebitis and thrombophlebitis of unspecified site: Secondary | ICD-10-CM | POA: Diagnosis not present

## 2016-11-26 DIAGNOSIS — I110 Hypertensive heart disease with heart failure: Secondary | ICD-10-CM | POA: Diagnosis not present

## 2016-11-26 DIAGNOSIS — G589 Mononeuropathy, unspecified: Secondary | ICD-10-CM | POA: Diagnosis not present

## 2016-11-26 DIAGNOSIS — Z87440 Personal history of urinary (tract) infections: Secondary | ICD-10-CM | POA: Diagnosis not present

## 2016-11-26 DIAGNOSIS — I809 Phlebitis and thrombophlebitis of unspecified site: Secondary | ICD-10-CM | POA: Diagnosis not present

## 2016-11-26 DIAGNOSIS — I5032 Chronic diastolic (congestive) heart failure: Secondary | ICD-10-CM | POA: Diagnosis not present

## 2016-11-26 DIAGNOSIS — Z7901 Long term (current) use of anticoagulants: Secondary | ICD-10-CM | POA: Diagnosis not present

## 2016-11-30 DIAGNOSIS — I110 Hypertensive heart disease with heart failure: Secondary | ICD-10-CM | POA: Diagnosis not present

## 2016-11-30 DIAGNOSIS — Z87440 Personal history of urinary (tract) infections: Secondary | ICD-10-CM | POA: Diagnosis not present

## 2016-11-30 DIAGNOSIS — Z7901 Long term (current) use of anticoagulants: Secondary | ICD-10-CM | POA: Diagnosis not present

## 2016-11-30 DIAGNOSIS — I5032 Chronic diastolic (congestive) heart failure: Secondary | ICD-10-CM | POA: Diagnosis not present

## 2016-11-30 DIAGNOSIS — I809 Phlebitis and thrombophlebitis of unspecified site: Secondary | ICD-10-CM | POA: Diagnosis not present

## 2016-11-30 DIAGNOSIS — G589 Mononeuropathy, unspecified: Secondary | ICD-10-CM | POA: Diagnosis not present

## 2016-12-03 DIAGNOSIS — G589 Mononeuropathy, unspecified: Secondary | ICD-10-CM | POA: Diagnosis not present

## 2016-12-03 DIAGNOSIS — I110 Hypertensive heart disease with heart failure: Secondary | ICD-10-CM | POA: Diagnosis not present

## 2016-12-03 DIAGNOSIS — Z7901 Long term (current) use of anticoagulants: Secondary | ICD-10-CM | POA: Diagnosis not present

## 2016-12-03 DIAGNOSIS — I809 Phlebitis and thrombophlebitis of unspecified site: Secondary | ICD-10-CM | POA: Diagnosis not present

## 2016-12-03 DIAGNOSIS — Z87440 Personal history of urinary (tract) infections: Secondary | ICD-10-CM | POA: Diagnosis not present

## 2016-12-03 DIAGNOSIS — I5032 Chronic diastolic (congestive) heart failure: Secondary | ICD-10-CM | POA: Diagnosis not present

## 2016-12-06 DIAGNOSIS — Z7901 Long term (current) use of anticoagulants: Secondary | ICD-10-CM | POA: Diagnosis not present

## 2016-12-06 DIAGNOSIS — I110 Hypertensive heart disease with heart failure: Secondary | ICD-10-CM | POA: Diagnosis not present

## 2016-12-06 DIAGNOSIS — I809 Phlebitis and thrombophlebitis of unspecified site: Secondary | ICD-10-CM | POA: Diagnosis not present

## 2016-12-06 DIAGNOSIS — Z87440 Personal history of urinary (tract) infections: Secondary | ICD-10-CM | POA: Diagnosis not present

## 2016-12-06 DIAGNOSIS — G589 Mononeuropathy, unspecified: Secondary | ICD-10-CM | POA: Diagnosis not present

## 2016-12-06 DIAGNOSIS — I5032 Chronic diastolic (congestive) heart failure: Secondary | ICD-10-CM | POA: Diagnosis not present

## 2016-12-09 DIAGNOSIS — I5032 Chronic diastolic (congestive) heart failure: Secondary | ICD-10-CM | POA: Diagnosis not present

## 2016-12-09 DIAGNOSIS — I110 Hypertensive heart disease with heart failure: Secondary | ICD-10-CM | POA: Diagnosis not present

## 2016-12-09 DIAGNOSIS — Z87440 Personal history of urinary (tract) infections: Secondary | ICD-10-CM | POA: Diagnosis not present

## 2016-12-09 DIAGNOSIS — G589 Mononeuropathy, unspecified: Secondary | ICD-10-CM | POA: Diagnosis not present

## 2016-12-09 DIAGNOSIS — I809 Phlebitis and thrombophlebitis of unspecified site: Secondary | ICD-10-CM | POA: Diagnosis not present

## 2016-12-09 DIAGNOSIS — Z7901 Long term (current) use of anticoagulants: Secondary | ICD-10-CM | POA: Diagnosis not present

## 2016-12-14 DIAGNOSIS — I5032 Chronic diastolic (congestive) heart failure: Secondary | ICD-10-CM | POA: Diagnosis not present

## 2016-12-14 DIAGNOSIS — Z7901 Long term (current) use of anticoagulants: Secondary | ICD-10-CM | POA: Diagnosis not present

## 2016-12-14 DIAGNOSIS — I809 Phlebitis and thrombophlebitis of unspecified site: Secondary | ICD-10-CM | POA: Diagnosis not present

## 2016-12-14 DIAGNOSIS — I110 Hypertensive heart disease with heart failure: Secondary | ICD-10-CM | POA: Diagnosis not present

## 2016-12-14 DIAGNOSIS — Z87440 Personal history of urinary (tract) infections: Secondary | ICD-10-CM | POA: Diagnosis not present

## 2016-12-14 DIAGNOSIS — G589 Mononeuropathy, unspecified: Secondary | ICD-10-CM | POA: Diagnosis not present

## 2016-12-15 DIAGNOSIS — G2581 Restless legs syndrome: Secondary | ICD-10-CM | POA: Diagnosis not present

## 2016-12-15 DIAGNOSIS — I10 Essential (primary) hypertension: Secondary | ICD-10-CM | POA: Diagnosis not present

## 2016-12-15 DIAGNOSIS — J449 Chronic obstructive pulmonary disease, unspecified: Secondary | ICD-10-CM | POA: Diagnosis not present

## 2016-12-15 DIAGNOSIS — J9611 Chronic respiratory failure with hypoxia: Secondary | ICD-10-CM | POA: Diagnosis not present

## 2016-12-16 DIAGNOSIS — Z7901 Long term (current) use of anticoagulants: Secondary | ICD-10-CM | POA: Diagnosis not present

## 2016-12-16 DIAGNOSIS — Z87440 Personal history of urinary (tract) infections: Secondary | ICD-10-CM | POA: Diagnosis not present

## 2016-12-16 DIAGNOSIS — I5032 Chronic diastolic (congestive) heart failure: Secondary | ICD-10-CM | POA: Diagnosis not present

## 2016-12-16 DIAGNOSIS — G589 Mononeuropathy, unspecified: Secondary | ICD-10-CM | POA: Diagnosis not present

## 2016-12-16 DIAGNOSIS — I809 Phlebitis and thrombophlebitis of unspecified site: Secondary | ICD-10-CM | POA: Diagnosis not present

## 2016-12-16 DIAGNOSIS — I110 Hypertensive heart disease with heart failure: Secondary | ICD-10-CM | POA: Diagnosis not present

## 2016-12-20 DIAGNOSIS — Z87440 Personal history of urinary (tract) infections: Secondary | ICD-10-CM | POA: Diagnosis not present

## 2016-12-20 DIAGNOSIS — I5032 Chronic diastolic (congestive) heart failure: Secondary | ICD-10-CM | POA: Diagnosis not present

## 2016-12-20 DIAGNOSIS — Z7901 Long term (current) use of anticoagulants: Secondary | ICD-10-CM | POA: Diagnosis not present

## 2016-12-20 DIAGNOSIS — G589 Mononeuropathy, unspecified: Secondary | ICD-10-CM | POA: Diagnosis not present

## 2016-12-20 DIAGNOSIS — I809 Phlebitis and thrombophlebitis of unspecified site: Secondary | ICD-10-CM | POA: Diagnosis not present

## 2016-12-20 DIAGNOSIS — I110 Hypertensive heart disease with heart failure: Secondary | ICD-10-CM | POA: Diagnosis not present

## 2016-12-22 DIAGNOSIS — I5032 Chronic diastolic (congestive) heart failure: Secondary | ICD-10-CM | POA: Diagnosis not present

## 2016-12-22 DIAGNOSIS — G589 Mononeuropathy, unspecified: Secondary | ICD-10-CM | POA: Diagnosis not present

## 2016-12-22 DIAGNOSIS — I110 Hypertensive heart disease with heart failure: Secondary | ICD-10-CM | POA: Diagnosis not present

## 2016-12-22 DIAGNOSIS — I809 Phlebitis and thrombophlebitis of unspecified site: Secondary | ICD-10-CM | POA: Diagnosis not present

## 2016-12-22 DIAGNOSIS — Z7901 Long term (current) use of anticoagulants: Secondary | ICD-10-CM | POA: Diagnosis not present

## 2016-12-22 DIAGNOSIS — Z87440 Personal history of urinary (tract) infections: Secondary | ICD-10-CM | POA: Diagnosis not present

## 2016-12-24 DIAGNOSIS — Z7901 Long term (current) use of anticoagulants: Secondary | ICD-10-CM | POA: Diagnosis not present

## 2016-12-24 DIAGNOSIS — G589 Mononeuropathy, unspecified: Secondary | ICD-10-CM | POA: Diagnosis not present

## 2016-12-24 DIAGNOSIS — I110 Hypertensive heart disease with heart failure: Secondary | ICD-10-CM | POA: Diagnosis not present

## 2016-12-24 DIAGNOSIS — I809 Phlebitis and thrombophlebitis of unspecified site: Secondary | ICD-10-CM | POA: Diagnosis not present

## 2016-12-24 DIAGNOSIS — Z23 Encounter for immunization: Secondary | ICD-10-CM | POA: Diagnosis not present

## 2016-12-24 DIAGNOSIS — I5032 Chronic diastolic (congestive) heart failure: Secondary | ICD-10-CM | POA: Diagnosis not present

## 2016-12-24 DIAGNOSIS — Z87440 Personal history of urinary (tract) infections: Secondary | ICD-10-CM | POA: Diagnosis not present

## 2016-12-27 DIAGNOSIS — I5032 Chronic diastolic (congestive) heart failure: Secondary | ICD-10-CM | POA: Diagnosis not present

## 2016-12-27 DIAGNOSIS — Z7901 Long term (current) use of anticoagulants: Secondary | ICD-10-CM | POA: Diagnosis not present

## 2016-12-27 DIAGNOSIS — I110 Hypertensive heart disease with heart failure: Secondary | ICD-10-CM | POA: Diagnosis not present

## 2016-12-27 DIAGNOSIS — Z87440 Personal history of urinary (tract) infections: Secondary | ICD-10-CM | POA: Diagnosis not present

## 2016-12-27 DIAGNOSIS — G589 Mononeuropathy, unspecified: Secondary | ICD-10-CM | POA: Diagnosis not present

## 2016-12-27 DIAGNOSIS — I809 Phlebitis and thrombophlebitis of unspecified site: Secondary | ICD-10-CM | POA: Diagnosis not present

## 2016-12-30 DIAGNOSIS — I809 Phlebitis and thrombophlebitis of unspecified site: Secondary | ICD-10-CM | POA: Diagnosis not present

## 2016-12-30 DIAGNOSIS — I110 Hypertensive heart disease with heart failure: Secondary | ICD-10-CM | POA: Diagnosis not present

## 2016-12-30 DIAGNOSIS — G589 Mononeuropathy, unspecified: Secondary | ICD-10-CM | POA: Diagnosis not present

## 2016-12-30 DIAGNOSIS — Z87440 Personal history of urinary (tract) infections: Secondary | ICD-10-CM | POA: Diagnosis not present

## 2016-12-30 DIAGNOSIS — I5032 Chronic diastolic (congestive) heart failure: Secondary | ICD-10-CM | POA: Diagnosis not present

## 2016-12-30 DIAGNOSIS — Z7901 Long term (current) use of anticoagulants: Secondary | ICD-10-CM | POA: Diagnosis not present

## 2017-01-03 DIAGNOSIS — Z87440 Personal history of urinary (tract) infections: Secondary | ICD-10-CM | POA: Diagnosis not present

## 2017-01-03 DIAGNOSIS — Z9981 Dependence on supplemental oxygen: Secondary | ICD-10-CM | POA: Diagnosis not present

## 2017-01-03 DIAGNOSIS — Z7901 Long term (current) use of anticoagulants: Secondary | ICD-10-CM | POA: Diagnosis not present

## 2017-01-03 DIAGNOSIS — I110 Hypertensive heart disease with heart failure: Secondary | ICD-10-CM | POA: Diagnosis not present

## 2017-01-03 DIAGNOSIS — G589 Mononeuropathy, unspecified: Secondary | ICD-10-CM | POA: Diagnosis not present

## 2017-01-03 DIAGNOSIS — I809 Phlebitis and thrombophlebitis of unspecified site: Secondary | ICD-10-CM | POA: Diagnosis not present

## 2017-01-03 DIAGNOSIS — I5032 Chronic diastolic (congestive) heart failure: Secondary | ICD-10-CM | POA: Diagnosis not present

## 2017-01-04 DIAGNOSIS — G589 Mononeuropathy, unspecified: Secondary | ICD-10-CM | POA: Diagnosis not present

## 2017-01-04 DIAGNOSIS — Z7901 Long term (current) use of anticoagulants: Secondary | ICD-10-CM | POA: Diagnosis not present

## 2017-01-04 DIAGNOSIS — I5032 Chronic diastolic (congestive) heart failure: Secondary | ICD-10-CM | POA: Diagnosis not present

## 2017-01-04 DIAGNOSIS — I110 Hypertensive heart disease with heart failure: Secondary | ICD-10-CM | POA: Diagnosis not present

## 2017-01-04 DIAGNOSIS — Z87440 Personal history of urinary (tract) infections: Secondary | ICD-10-CM | POA: Diagnosis not present

## 2017-01-04 DIAGNOSIS — I809 Phlebitis and thrombophlebitis of unspecified site: Secondary | ICD-10-CM | POA: Diagnosis not present

## 2017-01-06 DIAGNOSIS — Z87440 Personal history of urinary (tract) infections: Secondary | ICD-10-CM | POA: Diagnosis not present

## 2017-01-06 DIAGNOSIS — Z7901 Long term (current) use of anticoagulants: Secondary | ICD-10-CM | POA: Diagnosis not present

## 2017-01-06 DIAGNOSIS — I5032 Chronic diastolic (congestive) heart failure: Secondary | ICD-10-CM | POA: Diagnosis not present

## 2017-01-06 DIAGNOSIS — G589 Mononeuropathy, unspecified: Secondary | ICD-10-CM | POA: Diagnosis not present

## 2017-01-06 DIAGNOSIS — I110 Hypertensive heart disease with heart failure: Secondary | ICD-10-CM | POA: Diagnosis not present

## 2017-01-06 DIAGNOSIS — I809 Phlebitis and thrombophlebitis of unspecified site: Secondary | ICD-10-CM | POA: Diagnosis not present

## 2017-01-10 DIAGNOSIS — Z7901 Long term (current) use of anticoagulants: Secondary | ICD-10-CM | POA: Diagnosis not present

## 2017-01-10 DIAGNOSIS — Z87440 Personal history of urinary (tract) infections: Secondary | ICD-10-CM | POA: Diagnosis not present

## 2017-01-10 DIAGNOSIS — I809 Phlebitis and thrombophlebitis of unspecified site: Secondary | ICD-10-CM | POA: Diagnosis not present

## 2017-01-10 DIAGNOSIS — I5032 Chronic diastolic (congestive) heart failure: Secondary | ICD-10-CM | POA: Diagnosis not present

## 2017-01-10 DIAGNOSIS — G589 Mononeuropathy, unspecified: Secondary | ICD-10-CM | POA: Diagnosis not present

## 2017-01-10 DIAGNOSIS — I110 Hypertensive heart disease with heart failure: Secondary | ICD-10-CM | POA: Diagnosis not present

## 2017-01-12 DIAGNOSIS — I5032 Chronic diastolic (congestive) heart failure: Secondary | ICD-10-CM | POA: Diagnosis not present

## 2017-01-12 DIAGNOSIS — I809 Phlebitis and thrombophlebitis of unspecified site: Secondary | ICD-10-CM | POA: Diagnosis not present

## 2017-01-12 DIAGNOSIS — G589 Mononeuropathy, unspecified: Secondary | ICD-10-CM | POA: Diagnosis not present

## 2017-01-12 DIAGNOSIS — I110 Hypertensive heart disease with heart failure: Secondary | ICD-10-CM | POA: Diagnosis not present

## 2017-01-12 DIAGNOSIS — Z7901 Long term (current) use of anticoagulants: Secondary | ICD-10-CM | POA: Diagnosis not present

## 2017-01-12 DIAGNOSIS — Z87440 Personal history of urinary (tract) infections: Secondary | ICD-10-CM | POA: Diagnosis not present

## 2017-01-18 DIAGNOSIS — G589 Mononeuropathy, unspecified: Secondary | ICD-10-CM | POA: Diagnosis not present

## 2017-01-18 DIAGNOSIS — I5032 Chronic diastolic (congestive) heart failure: Secondary | ICD-10-CM | POA: Diagnosis not present

## 2017-01-18 DIAGNOSIS — I110 Hypertensive heart disease with heart failure: Secondary | ICD-10-CM | POA: Diagnosis not present

## 2017-01-18 DIAGNOSIS — I809 Phlebitis and thrombophlebitis of unspecified site: Secondary | ICD-10-CM | POA: Diagnosis not present

## 2017-01-18 DIAGNOSIS — Z7901 Long term (current) use of anticoagulants: Secondary | ICD-10-CM | POA: Diagnosis not present

## 2017-01-18 DIAGNOSIS — Z87440 Personal history of urinary (tract) infections: Secondary | ICD-10-CM | POA: Diagnosis not present

## 2017-01-19 DIAGNOSIS — I10 Essential (primary) hypertension: Secondary | ICD-10-CM | POA: Diagnosis not present

## 2017-01-19 DIAGNOSIS — J9611 Chronic respiratory failure with hypoxia: Secondary | ICD-10-CM | POA: Diagnosis not present

## 2017-01-19 DIAGNOSIS — N3281 Overactive bladder: Secondary | ICD-10-CM | POA: Diagnosis not present

## 2017-01-19 DIAGNOSIS — I5032 Chronic diastolic (congestive) heart failure: Secondary | ICD-10-CM | POA: Diagnosis not present

## 2017-01-20 DIAGNOSIS — I5032 Chronic diastolic (congestive) heart failure: Secondary | ICD-10-CM | POA: Diagnosis not present

## 2017-01-20 DIAGNOSIS — G589 Mononeuropathy, unspecified: Secondary | ICD-10-CM | POA: Diagnosis not present

## 2017-01-20 DIAGNOSIS — Z7901 Long term (current) use of anticoagulants: Secondary | ICD-10-CM | POA: Diagnosis not present

## 2017-01-20 DIAGNOSIS — Z87440 Personal history of urinary (tract) infections: Secondary | ICD-10-CM | POA: Diagnosis not present

## 2017-01-20 DIAGNOSIS — I110 Hypertensive heart disease with heart failure: Secondary | ICD-10-CM | POA: Diagnosis not present

## 2017-01-20 DIAGNOSIS — I809 Phlebitis and thrombophlebitis of unspecified site: Secondary | ICD-10-CM | POA: Diagnosis not present

## 2017-01-24 DIAGNOSIS — Z7901 Long term (current) use of anticoagulants: Secondary | ICD-10-CM | POA: Diagnosis not present

## 2017-01-24 DIAGNOSIS — I110 Hypertensive heart disease with heart failure: Secondary | ICD-10-CM | POA: Diagnosis not present

## 2017-01-24 DIAGNOSIS — I809 Phlebitis and thrombophlebitis of unspecified site: Secondary | ICD-10-CM | POA: Diagnosis not present

## 2017-01-24 DIAGNOSIS — G589 Mononeuropathy, unspecified: Secondary | ICD-10-CM | POA: Diagnosis not present

## 2017-01-24 DIAGNOSIS — I5032 Chronic diastolic (congestive) heart failure: Secondary | ICD-10-CM | POA: Diagnosis not present

## 2017-01-24 DIAGNOSIS — Z87440 Personal history of urinary (tract) infections: Secondary | ICD-10-CM | POA: Diagnosis not present

## 2017-01-27 DIAGNOSIS — I809 Phlebitis and thrombophlebitis of unspecified site: Secondary | ICD-10-CM | POA: Diagnosis not present

## 2017-01-27 DIAGNOSIS — I110 Hypertensive heart disease with heart failure: Secondary | ICD-10-CM | POA: Diagnosis not present

## 2017-01-27 DIAGNOSIS — I5032 Chronic diastolic (congestive) heart failure: Secondary | ICD-10-CM | POA: Diagnosis not present

## 2017-01-27 DIAGNOSIS — G589 Mononeuropathy, unspecified: Secondary | ICD-10-CM | POA: Diagnosis not present

## 2017-01-27 DIAGNOSIS — Z87440 Personal history of urinary (tract) infections: Secondary | ICD-10-CM | POA: Diagnosis not present

## 2017-01-27 DIAGNOSIS — Z7901 Long term (current) use of anticoagulants: Secondary | ICD-10-CM | POA: Diagnosis not present

## 2017-02-02 DIAGNOSIS — Z7901 Long term (current) use of anticoagulants: Secondary | ICD-10-CM | POA: Diagnosis not present

## 2017-02-02 DIAGNOSIS — I809 Phlebitis and thrombophlebitis of unspecified site: Secondary | ICD-10-CM | POA: Diagnosis not present

## 2017-02-02 DIAGNOSIS — I110 Hypertensive heart disease with heart failure: Secondary | ICD-10-CM | POA: Diagnosis not present

## 2017-02-02 DIAGNOSIS — Z87440 Personal history of urinary (tract) infections: Secondary | ICD-10-CM | POA: Diagnosis not present

## 2017-02-02 DIAGNOSIS — I5032 Chronic diastolic (congestive) heart failure: Secondary | ICD-10-CM | POA: Diagnosis not present

## 2017-02-02 DIAGNOSIS — G589 Mononeuropathy, unspecified: Secondary | ICD-10-CM | POA: Diagnosis not present

## 2017-02-07 DIAGNOSIS — I739 Peripheral vascular disease, unspecified: Secondary | ICD-10-CM | POA: Diagnosis not present

## 2017-02-08 DIAGNOSIS — Z87440 Personal history of urinary (tract) infections: Secondary | ICD-10-CM | POA: Diagnosis not present

## 2017-02-08 DIAGNOSIS — I809 Phlebitis and thrombophlebitis of unspecified site: Secondary | ICD-10-CM | POA: Diagnosis not present

## 2017-02-08 DIAGNOSIS — I110 Hypertensive heart disease with heart failure: Secondary | ICD-10-CM | POA: Diagnosis not present

## 2017-02-08 DIAGNOSIS — I5032 Chronic diastolic (congestive) heart failure: Secondary | ICD-10-CM | POA: Diagnosis not present

## 2017-02-08 DIAGNOSIS — G589 Mononeuropathy, unspecified: Secondary | ICD-10-CM | POA: Diagnosis not present

## 2017-02-08 DIAGNOSIS — Z7901 Long term (current) use of anticoagulants: Secondary | ICD-10-CM | POA: Diagnosis not present

## 2017-02-11 DIAGNOSIS — Z87440 Personal history of urinary (tract) infections: Secondary | ICD-10-CM | POA: Diagnosis not present

## 2017-02-11 DIAGNOSIS — G589 Mononeuropathy, unspecified: Secondary | ICD-10-CM | POA: Diagnosis not present

## 2017-02-11 DIAGNOSIS — I5032 Chronic diastolic (congestive) heart failure: Secondary | ICD-10-CM | POA: Diagnosis not present

## 2017-02-11 DIAGNOSIS — Z7901 Long term (current) use of anticoagulants: Secondary | ICD-10-CM | POA: Diagnosis not present

## 2017-02-11 DIAGNOSIS — I809 Phlebitis and thrombophlebitis of unspecified site: Secondary | ICD-10-CM | POA: Diagnosis not present

## 2017-02-11 DIAGNOSIS — I110 Hypertensive heart disease with heart failure: Secondary | ICD-10-CM | POA: Diagnosis not present

## 2017-02-14 DIAGNOSIS — I110 Hypertensive heart disease with heart failure: Secondary | ICD-10-CM | POA: Diagnosis not present

## 2017-02-14 DIAGNOSIS — I5032 Chronic diastolic (congestive) heart failure: Secondary | ICD-10-CM | POA: Diagnosis not present

## 2017-02-14 DIAGNOSIS — G589 Mononeuropathy, unspecified: Secondary | ICD-10-CM | POA: Diagnosis not present

## 2017-02-14 DIAGNOSIS — I809 Phlebitis and thrombophlebitis of unspecified site: Secondary | ICD-10-CM | POA: Diagnosis not present

## 2017-02-14 DIAGNOSIS — Z87440 Personal history of urinary (tract) infections: Secondary | ICD-10-CM | POA: Diagnosis not present

## 2017-02-14 DIAGNOSIS — Z7901 Long term (current) use of anticoagulants: Secondary | ICD-10-CM | POA: Diagnosis not present

## 2017-02-17 DIAGNOSIS — Z7901 Long term (current) use of anticoagulants: Secondary | ICD-10-CM | POA: Diagnosis not present

## 2017-02-17 DIAGNOSIS — I110 Hypertensive heart disease with heart failure: Secondary | ICD-10-CM | POA: Diagnosis not present

## 2017-02-17 DIAGNOSIS — I5032 Chronic diastolic (congestive) heart failure: Secondary | ICD-10-CM | POA: Diagnosis not present

## 2017-02-17 DIAGNOSIS — Z87440 Personal history of urinary (tract) infections: Secondary | ICD-10-CM | POA: Diagnosis not present

## 2017-02-17 DIAGNOSIS — I809 Phlebitis and thrombophlebitis of unspecified site: Secondary | ICD-10-CM | POA: Diagnosis not present

## 2017-02-17 DIAGNOSIS — G589 Mononeuropathy, unspecified: Secondary | ICD-10-CM | POA: Diagnosis not present

## 2017-02-23 DIAGNOSIS — I809 Phlebitis and thrombophlebitis of unspecified site: Secondary | ICD-10-CM | POA: Diagnosis not present

## 2017-02-23 DIAGNOSIS — G589 Mononeuropathy, unspecified: Secondary | ICD-10-CM | POA: Diagnosis not present

## 2017-02-23 DIAGNOSIS — Z7901 Long term (current) use of anticoagulants: Secondary | ICD-10-CM | POA: Diagnosis not present

## 2017-02-23 DIAGNOSIS — I5032 Chronic diastolic (congestive) heart failure: Secondary | ICD-10-CM | POA: Diagnosis not present

## 2017-02-23 DIAGNOSIS — I110 Hypertensive heart disease with heart failure: Secondary | ICD-10-CM | POA: Diagnosis not present

## 2017-02-23 DIAGNOSIS — Z87440 Personal history of urinary (tract) infections: Secondary | ICD-10-CM | POA: Diagnosis not present

## 2017-02-25 DIAGNOSIS — Z7901 Long term (current) use of anticoagulants: Secondary | ICD-10-CM | POA: Diagnosis not present

## 2017-02-25 DIAGNOSIS — Z87440 Personal history of urinary (tract) infections: Secondary | ICD-10-CM | POA: Diagnosis not present

## 2017-02-25 DIAGNOSIS — I5032 Chronic diastolic (congestive) heart failure: Secondary | ICD-10-CM | POA: Diagnosis not present

## 2017-02-25 DIAGNOSIS — I809 Phlebitis and thrombophlebitis of unspecified site: Secondary | ICD-10-CM | POA: Diagnosis not present

## 2017-02-25 DIAGNOSIS — G589 Mononeuropathy, unspecified: Secondary | ICD-10-CM | POA: Diagnosis not present

## 2017-02-25 DIAGNOSIS — I110 Hypertensive heart disease with heart failure: Secondary | ICD-10-CM | POA: Diagnosis not present

## 2017-02-28 ENCOUNTER — Encounter: Payer: Self-pay | Admitting: Cardiology

## 2017-02-28 ENCOUNTER — Ambulatory Visit (INDEPENDENT_AMBULATORY_CARE_PROVIDER_SITE_OTHER): Payer: Medicare Other | Admitting: Cardiology

## 2017-02-28 ENCOUNTER — Other Ambulatory Visit: Payer: Self-pay

## 2017-02-28 VITALS — BP 110/65 | HR 81 | Ht 62.0 in | Wt 190.0 lb

## 2017-02-28 DIAGNOSIS — I5033 Acute on chronic diastolic (congestive) heart failure: Secondary | ICD-10-CM | POA: Diagnosis not present

## 2017-02-28 MED ORDER — FUROSEMIDE 40 MG PO TABS: ORAL_TABLET | ORAL | Status: DC

## 2017-02-28 NOTE — Progress Notes (Signed)
Clinical Summary Christine Hancock is a 84 y.o.female seen as new consult, referred by Dr Luan Pulling for weight gain and SOb.   1. Chronic diasotlic HF - echo 06/5730 LVEF 65-70%, no WMAs, grade I diastoilc dysfunction w/ high LA pressure - history of chronic LE edema and venous insufficiency  - increased SOB. Can have some LE edema at times. No orthopnea.  - compliant with meds.   Oct weight 182 lbs. Nov wt 169 lbs. Sept 178 lbs.  Past Medical History:  Diagnosis Date  . Anxiety   . DVT, lower extremity, recurrent ([place])   . GERD (gastroesophageal reflux disease)   . Hypercholesterolemia   . Hypotension   . Hypothyroidism   . Phlebitis      No Known Allergies   Current Outpatient Medications  Medication Sig Dispense Refill  . albuterol (PROVENTIL HFA;VENTOLIN HFA) 108 (90 Base) MCG/ACT inhaler Inhale 2 puffs into the lungs every 6 (six) hours as needed for wheezing or shortness of breath. 1 Inhaler 2  . carboxymethylcellulose (REFRESH PLUS) 0.5 % SOLN Place 1 drop into both eyes 4 (four) times daily.    . cefUROXime (CEFTIN) 250 MG tablet Take 1 tablet (250 mg total) by mouth 2 (two) times daily with a meal. 20 tablet 1  . ELIQUIS 5 MG TABS tablet Take 5 mg by mouth 2 (two) times daily.     . fluticasone (VERAMYST) 27.5 MCG/SPRAY nasal spray Place 1 spray into the nose daily as needed for rhinitis.    . furosemide (LASIX) 40 MG tablet Take 40 mg by mouth daily.    Marland Kitchen gabapentin (NEURONTIN) 300 MG capsule Take 600 mg by mouth at bedtime.    . gabapentin (NEURONTIN) 600 MG tablet Take 600 mg by mouth at bedtime.     Marland Kitchen ketoconazole (NIZORAL) 2 % shampoo Apply 1 application topically once a week. As needed for antifungal.    . levothyroxine (SYNTHROID, LEVOTHROID) 88 MCG tablet Take 88 mcg by mouth daily before breakfast.    . LINZESS 290 MCG CAPS capsule Take 290 mcg by mouth daily before breakfast.     . NIFEdipine (PROCARDIA-XL/ADALAT CC) 30 MG 24 hr tablet Take 30 mg by mouth daily.     Marland Kitchen omeprazole (PRILOSEC) 20 MG capsule Take 20 mg by mouth daily.    Marland Kitchen oxybutynin (DITROPAN) 5 MG tablet Take 5 mg by mouth 2 (two) times daily.     . polyethylene glycol powder (GLYCOLAX/MIRALAX) powder Take 17 g by mouth daily.     . pravastatin (PRAVACHOL) 40 MG tablet Take 40 mg by mouth at bedtime.     . risperiDONE (RISPERDAL) 0.25 MG tablet Take 0.25 mg by mouth daily.    . risperiDONE (RISPERDAL) 0.5 MG tablet Take 0.5 mg by mouth at bedtime.     Marland Kitchen rOPINIRole (REQUIP) 0.5 MG tablet Take 0.5 mg by mouth at bedtime.     Marland Kitchen tobramycin-dexamethasone (TOBRADEX) ophthalmic ointment Place 1 application into both eyes 3 (three) times daily.    . traZODone (DESYREL) 50 MG tablet Take 25 mg by mouth at bedtime.     No current facility-administered medications for this visit.      Past Surgical History:  Procedure Laterality Date  . ABDOMINAL HYSTERECTOMY     partial  . CATARACT EXTRACTION W/PHACO Right 06/28/2013   Procedure: CATARACT EXTRACTION PHACO AND INTRAOCULAR LENS PLACEMENT (IOC);  Surgeon: Tonny , MD;  Location: [facility];  Service: Ophthalmology;  Laterality: Right;  CDE:13.79  .  CATARACT EXTRACTION W/PHACO Left 07/26/2013   Procedure: CATARACT EXTRACTION PHACO AND INTRAOCULAR LENS PLACEMENT LEFT EYE;  Surgeon: Tonny Zena Vitelli, MD;  Location: [facility];  Service: Ophthalmology;  Laterality: Left;  CDE: 11.76  . CHOLECYSTECTOMY    . ESOPHAGEAL DILATION    . TONSILLECTOMY       No Known Allergies    Family History  Problem Relation Age of Onset  . Diabetes Mother   . Diabetes Brother   . Anemia Brother   . Anemia Sister   . Coronary artery disease Other      Social History Christine Hancock reports that she quit smoking about 11 years ago. Her smoking use included cigarettes. She has a 3.75 pack-year smoking history. she has never used smokeless tobacco. Christine Hancock reports that she does not drink alcohol.   Review of Systems CONSTITUTIONAL: No weight loss, fever, chills, weakness  or fatigue.  HEENT: Eyes: No visual loss, blurred vision, double vision or yellow sclerae.No hearing loss, sneezing, congestion, runny nose or sore throat.  SKIN: No rash or itching.  CARDIOVASCULAR: per hpi RESPIRATORY: per hpi GASTROINTESTINAL: No anorexia, nausea, vomiting or diarrhea. No abdominal pain or blood.  GENITOURINARY: No burning on urination, no polyuria NEUROLOGICAL: No headache, dizziness, syncope, paralysis, ataxia, numbness or tingling in the extremities. No change in bowel or bladder control.  MUSCULOSKELETAL: No muscle, back pain, joint pain or stiffness.  LYMPHATICS: No enlarged nodes. No history of splenectomy.  PSYCHIATRIC: No history of depression or anxiety.  ENDOCRINOLOGIC: No reports of sweating, cold or heat intolerance. No polyuria or polydipsia.  Marland Kitchen   Physical Examination Vitals:   02/28/17 1334 02/28/17 1338  BP: 105/64 110/65  Pulse: 80 81  SpO2: 97% 98%   Vitals:   02/28/17 1334  Weight: 190 lb (86.2 kg)  Height: 5\' 2"  (1.575 m)    Gen: resting comfortably, no acute distress HEENT: no scleral icterus, pupils equal round and reactive, no palptable cervical adenopathy,  CV: RRR, no m/r/g, no jvd Resp: Clear to auscultation bilaterally GI: abdomen is soft, non-tender, non-distended, normal bowel sounds, no hepatosplenomegaly MSK: extremities are warm, 1+ bilateral edema Skin: warm, no rash Neuro:  no focal deficits Psych: appropriate affect   Diagnostic Studies  10/2016 Echo Study Conclusions  - Left ventricle: The cavity size was normal. Wall thickness was   increased in a pattern of mild LVH. Systolic function was   vigorous. The estimated ejection fraction was in the range of 65%   to 70%. Wall motion was normal; there were no regional wall   motion abnormalities. Doppler parameters are consistent with   abnormal left ventricular relaxation (grade 1 diastolic   dysfunction). Doppler parameters are consistent with high   ventricular  filling pressure. - Aortic valve: Mildly calcified annulus. Mildly thickened   leaflets. Valve area (VTI): 2.54 cm^2. Valve area (Vmax): 2.45   cm^2. - Mitral valve: Mildly calcified annulus. Mildly thickened leaflets   Assessment and Plan  1. Acute on chronic diastolic HF - increase lasix to 60mg  in AM and 40mg  in PM, she is currenlty takign 40mg  bid - in 2 weeks check BMET/Mg  F/u 1 month      Arnoldo Lenis, M.D.

## 2017-02-28 NOTE — Patient Instructions (Addendum)
Medication Instructions:   Change your Lasix to 60mg  on Monday, Wednesday, & Friday and 40mg  on Tuesday, Thursday, Saturday, & Sunday.  Continue all other medications.    Labwork:  BMET, Magnesium  Office will contact with results via phone or letter.    Labs due in 2 weeks   Testing/Procedures: none  Follow-Up: 1 month  Any Other Special Instructions Will Be Listed Below (If Applicable).  If you need a refill on your cardiac medications before your next appointment, please call your pharmacy.

## 2017-03-01 ENCOUNTER — Telehealth: Payer: Self-pay | Admitting: *Deleted

## 2017-03-01 DIAGNOSIS — I5033 Acute on chronic diastolic (congestive) heart failure: Secondary | ICD-10-CM

## 2017-03-01 MED ORDER — FUROSEMIDE 40 MG PO TABS
ORAL_TABLET | ORAL | 3 refills | Status: DC
Start: 2017-03-01 — End: 2017-04-15

## 2017-03-01 NOTE — Telephone Encounter (Signed)
Per [facility], patient has been getting furosemide 40 mg BID bubbled packed for some time by Dr. Luan Pulling. Please advise of dose change.

## 2017-03-01 NOTE — Telephone Encounter (Signed)
Change to 60mg  in AM and 40mg  in PM   Zandra Abts MD

## 2017-03-02 DIAGNOSIS — I110 Hypertensive heart disease with heart failure: Secondary | ICD-10-CM | POA: Diagnosis not present

## 2017-03-02 DIAGNOSIS — G589 Mononeuropathy, unspecified: Secondary | ICD-10-CM | POA: Diagnosis not present

## 2017-03-02 DIAGNOSIS — Z87440 Personal history of urinary (tract) infections: Secondary | ICD-10-CM | POA: Diagnosis not present

## 2017-03-02 DIAGNOSIS — I5032 Chronic diastolic (congestive) heart failure: Secondary | ICD-10-CM | POA: Diagnosis not present

## 2017-03-02 DIAGNOSIS — I809 Phlebitis and thrombophlebitis of unspecified site: Secondary | ICD-10-CM | POA: Diagnosis not present

## 2017-03-02 DIAGNOSIS — Z7901 Long term (current) use of anticoagulants: Secondary | ICD-10-CM | POA: Diagnosis not present

## 2017-03-03 ENCOUNTER — Encounter: Payer: Self-pay | Admitting: Cardiology

## 2017-03-04 DIAGNOSIS — Z9981 Dependence on supplemental oxygen: Secondary | ICD-10-CM | POA: Diagnosis not present

## 2017-03-04 DIAGNOSIS — I809 Phlebitis and thrombophlebitis of unspecified site: Secondary | ICD-10-CM | POA: Diagnosis not present

## 2017-03-04 DIAGNOSIS — Z7901 Long term (current) use of anticoagulants: Secondary | ICD-10-CM | POA: Diagnosis not present

## 2017-03-04 DIAGNOSIS — Z87440 Personal history of urinary (tract) infections: Secondary | ICD-10-CM | POA: Diagnosis not present

## 2017-03-04 DIAGNOSIS — I110 Hypertensive heart disease with heart failure: Secondary | ICD-10-CM | POA: Diagnosis not present

## 2017-03-04 DIAGNOSIS — I5032 Chronic diastolic (congestive) heart failure: Secondary | ICD-10-CM | POA: Diagnosis not present

## 2017-03-04 DIAGNOSIS — G589 Mononeuropathy, unspecified: Secondary | ICD-10-CM | POA: Diagnosis not present

## 2017-03-05 DIAGNOSIS — R69 Illness, unspecified: Secondary | ICD-10-CM | POA: Diagnosis not present

## 2017-03-08 ENCOUNTER — Other Ambulatory Visit: Payer: Self-pay | Admitting: Cardiology

## 2017-03-08 DIAGNOSIS — G589 Mononeuropathy, unspecified: Secondary | ICD-10-CM | POA: Diagnosis not present

## 2017-03-08 DIAGNOSIS — I809 Phlebitis and thrombophlebitis of unspecified site: Secondary | ICD-10-CM | POA: Diagnosis not present

## 2017-03-08 DIAGNOSIS — Z87440 Personal history of urinary (tract) infections: Secondary | ICD-10-CM | POA: Diagnosis not present

## 2017-03-08 DIAGNOSIS — I5032 Chronic diastolic (congestive) heart failure: Secondary | ICD-10-CM | POA: Diagnosis not present

## 2017-03-08 DIAGNOSIS — Z7901 Long term (current) use of anticoagulants: Secondary | ICD-10-CM | POA: Diagnosis not present

## 2017-03-08 DIAGNOSIS — I110 Hypertensive heart disease with heart failure: Secondary | ICD-10-CM | POA: Diagnosis not present

## 2017-03-11 DIAGNOSIS — Z87440 Personal history of urinary (tract) infections: Secondary | ICD-10-CM | POA: Diagnosis not present

## 2017-03-11 DIAGNOSIS — I110 Hypertensive heart disease with heart failure: Secondary | ICD-10-CM | POA: Diagnosis not present

## 2017-03-11 DIAGNOSIS — I809 Phlebitis and thrombophlebitis of unspecified site: Secondary | ICD-10-CM | POA: Diagnosis not present

## 2017-03-11 DIAGNOSIS — Z7901 Long term (current) use of anticoagulants: Secondary | ICD-10-CM | POA: Diagnosis not present

## 2017-03-11 DIAGNOSIS — I5032 Chronic diastolic (congestive) heart failure: Secondary | ICD-10-CM | POA: Diagnosis not present

## 2017-03-11 DIAGNOSIS — G589 Mononeuropathy, unspecified: Secondary | ICD-10-CM | POA: Diagnosis not present

## 2017-03-14 DIAGNOSIS — I809 Phlebitis and thrombophlebitis of unspecified site: Secondary | ICD-10-CM | POA: Diagnosis not present

## 2017-03-14 DIAGNOSIS — I110 Hypertensive heart disease with heart failure: Secondary | ICD-10-CM | POA: Diagnosis not present

## 2017-03-14 DIAGNOSIS — Z7901 Long term (current) use of anticoagulants: Secondary | ICD-10-CM | POA: Diagnosis not present

## 2017-03-14 DIAGNOSIS — I5032 Chronic diastolic (congestive) heart failure: Secondary | ICD-10-CM | POA: Diagnosis not present

## 2017-03-14 DIAGNOSIS — Z87440 Personal history of urinary (tract) infections: Secondary | ICD-10-CM | POA: Diagnosis not present

## 2017-03-14 DIAGNOSIS — G589 Mononeuropathy, unspecified: Secondary | ICD-10-CM | POA: Diagnosis not present

## 2017-03-15 DIAGNOSIS — I5033 Acute on chronic diastolic (congestive) heart failure: Secondary | ICD-10-CM | POA: Diagnosis not present

## 2017-03-18 DIAGNOSIS — I110 Hypertensive heart disease with heart failure: Secondary | ICD-10-CM | POA: Diagnosis not present

## 2017-03-18 DIAGNOSIS — Z87440 Personal history of urinary (tract) infections: Secondary | ICD-10-CM | POA: Diagnosis not present

## 2017-03-18 DIAGNOSIS — I809 Phlebitis and thrombophlebitis of unspecified site: Secondary | ICD-10-CM | POA: Diagnosis not present

## 2017-03-18 DIAGNOSIS — Z7901 Long term (current) use of anticoagulants: Secondary | ICD-10-CM | POA: Diagnosis not present

## 2017-03-18 DIAGNOSIS — G589 Mononeuropathy, unspecified: Secondary | ICD-10-CM | POA: Diagnosis not present

## 2017-03-18 DIAGNOSIS — I5032 Chronic diastolic (congestive) heart failure: Secondary | ICD-10-CM | POA: Diagnosis not present

## 2017-03-21 ENCOUNTER — Telehealth: Payer: Self-pay

## 2017-03-21 DIAGNOSIS — G589 Mononeuropathy, unspecified: Secondary | ICD-10-CM | POA: Diagnosis not present

## 2017-03-21 DIAGNOSIS — I809 Phlebitis and thrombophlebitis of unspecified site: Secondary | ICD-10-CM | POA: Diagnosis not present

## 2017-03-21 DIAGNOSIS — I110 Hypertensive heart disease with heart failure: Secondary | ICD-10-CM | POA: Diagnosis not present

## 2017-03-21 DIAGNOSIS — I5032 Chronic diastolic (congestive) heart failure: Secondary | ICD-10-CM | POA: Diagnosis not present

## 2017-03-21 DIAGNOSIS — Z7901 Long term (current) use of anticoagulants: Secondary | ICD-10-CM | POA: Diagnosis not present

## 2017-03-21 DIAGNOSIS — Z87440 Personal history of urinary (tract) infections: Secondary | ICD-10-CM | POA: Diagnosis not present

## 2017-03-21 NOTE — Telephone Encounter (Signed)
Attempted to reach facility ([facility]) was told nurse would call back.

## 2017-03-21 NOTE — Telephone Encounter (Signed)
Nurse states patients weight is 184 lbs, but states the swelling seems about the same to her.

## 2017-03-21 NOTE — Telephone Encounter (Signed)
-----   Message from Arnoldo Lenis, MD sent at 03/17/2017  3:28 PM EST ----- Mild decrease in kidney function with increased lasix. How is her fluid/weight/swelling doing  J BranchMD

## 2017-03-21 NOTE — Telephone Encounter (Signed)
Weights look to be coming down, please repeat another BMET/Mg in 2 weeks. Continue current lasix   Zandra Abts MD

## 2017-03-22 NOTE — Telephone Encounter (Signed)
Left message for nurse to return call.

## 2017-03-23 NOTE — Telephone Encounter (Signed)
Patients med tech notified.

## 2017-03-25 DIAGNOSIS — I5032 Chronic diastolic (congestive) heart failure: Secondary | ICD-10-CM | POA: Diagnosis not present

## 2017-03-25 DIAGNOSIS — Z7901 Long term (current) use of anticoagulants: Secondary | ICD-10-CM | POA: Diagnosis not present

## 2017-03-25 DIAGNOSIS — I809 Phlebitis and thrombophlebitis of unspecified site: Secondary | ICD-10-CM | POA: Diagnosis not present

## 2017-03-25 DIAGNOSIS — I110 Hypertensive heart disease with heart failure: Secondary | ICD-10-CM | POA: Diagnosis not present

## 2017-03-25 DIAGNOSIS — G589 Mononeuropathy, unspecified: Secondary | ICD-10-CM | POA: Diagnosis not present

## 2017-03-25 DIAGNOSIS — Z87440 Personal history of urinary (tract) infections: Secondary | ICD-10-CM | POA: Diagnosis not present

## 2017-03-30 DIAGNOSIS — I809 Phlebitis and thrombophlebitis of unspecified site: Secondary | ICD-10-CM | POA: Diagnosis not present

## 2017-03-30 DIAGNOSIS — Z7901 Long term (current) use of anticoagulants: Secondary | ICD-10-CM | POA: Diagnosis not present

## 2017-03-30 DIAGNOSIS — I5032 Chronic diastolic (congestive) heart failure: Secondary | ICD-10-CM | POA: Diagnosis not present

## 2017-03-30 DIAGNOSIS — I110 Hypertensive heart disease with heart failure: Secondary | ICD-10-CM | POA: Diagnosis not present

## 2017-03-30 DIAGNOSIS — Z87440 Personal history of urinary (tract) infections: Secondary | ICD-10-CM | POA: Diagnosis not present

## 2017-03-30 DIAGNOSIS — G589 Mononeuropathy, unspecified: Secondary | ICD-10-CM | POA: Diagnosis not present

## 2017-04-05 DIAGNOSIS — I809 Phlebitis and thrombophlebitis of unspecified site: Secondary | ICD-10-CM | POA: Diagnosis not present

## 2017-04-05 DIAGNOSIS — G589 Mononeuropathy, unspecified: Secondary | ICD-10-CM | POA: Diagnosis not present

## 2017-04-05 DIAGNOSIS — I110 Hypertensive heart disease with heart failure: Secondary | ICD-10-CM | POA: Diagnosis not present

## 2017-04-05 DIAGNOSIS — Z87440 Personal history of urinary (tract) infections: Secondary | ICD-10-CM | POA: Diagnosis not present

## 2017-04-05 DIAGNOSIS — Z7901 Long term (current) use of anticoagulants: Secondary | ICD-10-CM | POA: Diagnosis not present

## 2017-04-05 DIAGNOSIS — I5032 Chronic diastolic (congestive) heart failure: Secondary | ICD-10-CM | POA: Diagnosis not present

## 2017-04-14 ENCOUNTER — Encounter: Payer: Self-pay | Admitting: Cardiology

## 2017-04-14 ENCOUNTER — Ambulatory Visit (INDEPENDENT_AMBULATORY_CARE_PROVIDER_SITE_OTHER): Payer: Medicare Other | Admitting: Cardiology

## 2017-04-14 ENCOUNTER — Other Ambulatory Visit (HOSPITAL_COMMUNITY)
Admission: RE | Admit: 2017-04-14 | Discharge: 2017-04-14 | Disposition: A | Discharge status: Home / Self Care | Payer: Medicare Other | Source: Ambulatory Visit | Attending: Cardiology | Admitting: Cardiology

## 2017-04-14 VITALS — BP 118/58 | HR 108 | Ht 62.0 in | Wt 190.0 lb

## 2017-04-14 DIAGNOSIS — Z79899 Other long term (current) drug therapy: Secondary | ICD-10-CM | POA: Diagnosis not present

## 2017-04-14 DIAGNOSIS — I5032 Chronic diastolic (congestive) heart failure: Secondary | ICD-10-CM

## 2017-04-14 LAB — BASIC METABOLIC PANEL
Anion gap: 14 (ref 5–15)
BUN: 28 mg/dL — AB (ref 6–20)
CALCIUM: 8.4 mg/dL — AB (ref 8.9–10.3)
CO2: 21 mmol/L — ABNORMAL LOW (ref 22–32)
Chloride: 95 mmol/L — ABNORMAL LOW (ref 101–111)
Creatinine, Ser: 1.54 mg/dL — ABNORMAL HIGH (ref 0.44–1.00)
GFR calc Af Amer: 31 mL/min — ABNORMAL LOW (ref >60)
GFR, EST NON AFRICAN AMERICAN: 27 mL/min — AB (ref >60)
GLUCOSE: 226 mg/dL — AB (ref 65–99)
POTASSIUM: 3.8 mmol/L (ref 3.5–5.1)
Sodium: 130 mmol/L — ABNORMAL LOW (ref 135–145)

## 2017-04-14 LAB — MAGNESIUM: Magnesium: 1.9 mg/dL (ref 1.7–2.4)

## 2017-04-14 LAB — BRAIN NATRIURETIC PEPTIDE: B NATRIURETIC PEPTIDE 5: 63 pg/mL (ref 0.0–100.0)

## 2017-04-14 NOTE — Progress Notes (Signed)
Clinical Summary Christine Hancock is a 84 y.o.female seen today for follow up of the following medical problems.   1. Chronic diasotlic HF - echo 06/1023 LVEF 65-70%, no WMAs, grade I diastoilc dysfunction w/ high LA pressure - history of chronic LE edema and venous insufficiency  -Oct weight 182 lbs. Nov wt 169 lbs. Sept 178 lbs.   - last visit increased lasix to 60mg  in AM and 40mg  in PM. Repeat labs Cr up to 1.1 to 1.35 - was to have another repeat lab but has not gone for - swelling mildly improved. Weights at nursing home from 186 to 188 lbs - breathing is stable.   Past Medical History:  Diagnosis Date  . Anxiety   . DVT, lower extremity, recurrent ([place])   . GERD (gastroesophageal reflux disease)   . Hypercholesterolemia   . Hypotension   . Hypothyroidism   . Phlebitis      No Known Allergies   Current Outpatient Medications  Medication Sig Dispense Refill  . albuterol (PROVENTIL HFA;VENTOLIN HFA) 108 (90 Base) MCG/ACT inhaler Inhale 2 puffs into the lungs every 6 (six) hours as needed for wheezing or shortness of breath. 1 Inhaler 2  . apixaban (ELIQUIS) 2.5 MG TABS tablet Take 2.5 mg by mouth 2 (two) times daily.    . carboxymethylcellulose (REFRESH PLUS) 0.5 % SOLN Place 1 drop into both eyes 4 (four) times daily.    . cefUROXime (CEFTIN) 250 MG tablet Take 1 tablet (250 mg total) by mouth 2 (two) times daily with a meal. 20 tablet 1  . fluticasone (VERAMYST) 27.5 MCG/SPRAY nasal spray Place 1 spray into the nose daily as needed for rhinitis.    . furosemide (LASIX) 20 MG tablet TAKE 3 TABLETS BY MOUTH IN THE MORNING. AND 2 TABLETS BY MOUTH IN THEEVENING 150 tablet 1  . furosemide (LASIX) 40 MG tablet 60 mg in the morning and 40 mg in the evening 90 tablet 3  . gabapentin (NEURONTIN) 300 MG capsule Take 600 mg by mouth at bedtime.    Marland Kitchen ketoconazole (NIZORAL) 2 % shampoo Apply 1 application topically once a week. As needed for antifungal.    . levothyroxine  (SYNTHROID, LEVOTHROID) 88 MCG tablet Take 88 mcg by mouth daily before breakfast.    . LINZESS 290 MCG CAPS capsule Take 290 mcg by mouth daily before breakfast.     . NIFEdipine (PROCARDIA-XL/ADALAT CC) 30 MG 24 hr tablet Take 30 mg by mouth daily.    Marland Kitchen omeprazole (PRILOSEC) 20 MG capsule Take 20 mg by mouth daily.    Marland Kitchen oxybutynin (DITROPAN) 5 MG tablet Take 5 mg by mouth 2 (two) times daily.     . polyethylene glycol powder (GLYCOLAX/MIRALAX) powder Take 17 g by mouth daily.     . pravastatin (PRAVACHOL) 40 MG tablet Take 40 mg by mouth at bedtime.     . risperiDONE (RISPERDAL) 0.25 MG tablet Take 0.25 mg by mouth daily.    . risperiDONE (RISPERDAL) 0.5 MG tablet Take 0.5 mg by mouth at bedtime.     Marland Kitchen rOPINIRole (REQUIP) 0.5 MG tablet Take 0.5 mg by mouth at bedtime.     . traZODone (DESYREL) 50 MG tablet Take 25 mg by mouth at bedtime.    Marland Kitchen umeclidinium-vilanterol (ANORO ELLIPTA) 62.5-25 MCG/INH AEPB Inhale 1 puff into the lungs daily.     No current facility-administered medications for this visit.      Past Surgical History:  Procedure Laterality Date  .  ABDOMINAL HYSTERECTOMY     partial  . CATARACT EXTRACTION W/PHACO Right 06/28/2013   Procedure: CATARACT EXTRACTION PHACO AND INTRAOCULAR LENS PLACEMENT (IOC);  Surgeon: Tonny Travelle Mcclimans, MD;  Location: [facility];  Service: Ophthalmology;  Laterality: Right;  CDE:13.79  . CATARACT EXTRACTION W/PHACO Left 07/26/2013   Procedure: CATARACT EXTRACTION PHACO AND INTRAOCULAR LENS PLACEMENT LEFT EYE;  Surgeon: Tonny Courtney Bellizzi, MD;  Location: [facility];  Service: Ophthalmology;  Laterality: Left;  CDE: 11.76  . CHOLECYSTECTOMY    . ESOPHAGEAL DILATION    . TONSILLECTOMY       No Known Allergies    Family History  Problem Relation Age of Onset  . Diabetes Mother   . Diabetes Brother   . Anemia Brother   . Anemia Sister   . Coronary artery disease Other      Social History Christine Hancock reports that she quit smoking about 11 years ago. Her smoking  use included cigarettes. She has a 3.75 pack-year smoking history. she has never used smokeless tobacco. Christine Hancock reports that she does not drink alcohol.   Review of Systems CONSTITUTIONAL: No weight loss, fever, chills, weakness or fatigue.  HEENT: Eyes: No visual loss, blurred vision, double vision or yellow sclerae.No hearing loss, sneezing, congestion, runny nose or sore throat.  SKIN: No rash or itching.  CARDIOVASCULAR: per hpi RESPIRATORY: mild SOB.  GASTROINTESTINAL: No anorexia, nausea, vomiting or diarrhea. No abdominal pain or blood.  GENITOURINARY: No burning on urination, no polyuria NEUROLOGICAL: No headache, dizziness, syncope, paralysis, ataxia, numbness or tingling in the extremities. No change in bowel or bladder control.  MUSCULOSKELETAL: No muscle, back pain, joint pain or stiffness.  LYMPHATICS: No enlarged nodes. No history of splenectomy.  PSYCHIATRIC: No history of depression or anxiety.  ENDOCRINOLOGIC: No reports of sweating, cold or heat intolerance. No polyuria or polydipsia.  Marland Kitchen   Physical Examination Vitals:   04/14/17 1319  BP: (!) 118/58  Pulse: (!) 108  SpO2: 97%   Vitals:   04/14/17 1319  Weight: 190 lb (86.2 kg)  Height: 5\' 2"  (1.575 m)    Gen: resting comfortably, no acute distress HEENT: no scleral icterus, pupils equal round and reactive, no palptable cervical adenopathy,  CV: RRR, no m/r/g, no jvd Resp: Clear to auscultation bilaterally GI: abdomen is soft, non-tender, non-distended, normal bowel sounds, no hepatosplenomegaly MSK: extremities are warm, 1+ bilateral edema.  Skin: warm, no rash Neuro:  no focal deficits Psych: appropriate affect   Diagnostic Studies  10/2016 Echo Study Conclusions  - Left ventricle: The cavity size was normal. Wall thickness was increased in a pattern of mild LVH. Systolic function was vigorous. The estimated ejection fraction was in the range of 65% to 70%. Wall motion was normal; there  were no regional wall motion abnormalities. Doppler parameters are consistent with abnormal left ventricular relaxation (grade 1 diastolic dysfunction). Doppler parameters are consistent with high ventricular filling pressure. - Aortic valve: Mildly calcified annulus. Mildly thickened leaflets. Valve area (VTI): 2.54 cm^2. Valve area (Vmax): 2.45 cm^2. - Mitral valve: Mildly calcified annulus. Mildly thickened leaflets   Assessment and Plan  1. Chronic diastolic HF - recent increased LE edema, we increased her diuretics with uptrend in Cr noted. We will repeat labs again today and adjust diuretics accordingly. Suspect a component of venous insufficiency as the cause of her ongoing LE edema, and the reason why not responding well to diuretics.        Arnoldo Lenis, M.D.

## 2017-04-14 NOTE — Patient Instructions (Addendum)
Medication Instructions:  Your physician recommends that you continue on your current medications as directed. Please refer to the Current Medication list given to you today.   Labwork: Your physician recommends that you return for lab work in: Today   Testing/Procedures: NONE  Follow-Up: Your physician recommends that you schedule a follow-up appointment pending lab results    Any Other Special Instructions Will Be Listed Below (If Applicable).     If you need a refill on your cardiac medications before your next appointment, please call your pharmacy.  Thank you for choosing [facility]!

## 2017-04-15 ENCOUNTER — Telehealth: Payer: Self-pay | Admitting: *Deleted

## 2017-04-15 DIAGNOSIS — Z79899 Other long term (current) drug therapy: Secondary | ICD-10-CM

## 2017-04-15 MED ORDER — FUROSEMIDE 40 MG PO TABS: 40.0000 mg | ORAL_TABLET | Freq: Every day | ORAL | 3 refills | Status: DC

## 2017-04-15 NOTE — Telephone Encounter (Signed)
-----   Message from Arnoldo Lenis, MD sent at 04/15/2017 12:36 PM EST ----- Renal function has decreased since last check, needs to cut back on her lasix. Dont take any lasix Saturday or SUnday, then on Monday restart 40mg  bid. REpeat BMET/Mg in 3 weeks   J BrancH MD

## 2017-04-18 DIAGNOSIS — I739 Peripheral vascular disease, unspecified: Secondary | ICD-10-CM | POA: Diagnosis not present

## 2017-04-18 MED ORDER — FUROSEMIDE 40 MG PO TABS: 40.0000 mg | ORAL_TABLET | Freq: Two times a day (BID) | ORAL | 3 refills | Status: DC

## 2017-04-19 ENCOUNTER — Encounter: Payer: Self-pay | Admitting: Cardiology

## 2017-04-29 DIAGNOSIS — R399 Unspecified symptoms and signs involving the genitourinary system: Secondary | ICD-10-CM | POA: Diagnosis not present

## 2017-05-09 DIAGNOSIS — I10 Essential (primary) hypertension: Secondary | ICD-10-CM | POA: Diagnosis not present

## 2017-05-09 DIAGNOSIS — K219 Gastro-esophageal reflux disease without esophagitis: Secondary | ICD-10-CM | POA: Diagnosis not present

## 2017-05-09 DIAGNOSIS — J9611 Chronic respiratory failure with hypoxia: Secondary | ICD-10-CM | POA: Diagnosis not present

## 2017-05-09 DIAGNOSIS — I509 Heart failure, unspecified: Secondary | ICD-10-CM | POA: Diagnosis not present

## 2017-05-16 NOTE — Progress Notes (Signed)
Cardiology Office Note    Date:  05/17/2017   ID:  Christine Hancock, DOB Aug 23, 1917, MRN [id number]  PCP:  Sinda Du, MD  Cardiologist: Carlyle Dolly, MD    Chief Complaint  Patient presents with  . Follow-up    1 month visit    History of Present Illness:    Christine Hancock is a 84 y.o. female with past medical history of chronic diastolic CHF, HLD, Hypothyroidism, and prior DVT (on Eliquis) who presents to the office today for 33-month follow-up.   She was last examined by Dr. Harl Bowie on 04/14/2017 and reported that weight hds been around 186-188 lbs recently and said breathing was at baseline. She denied any recent changes in her respiratory status. Had been on Lasix 60mg  in AM//40mg  in PM and repeat labs showed her creatinine trended up from 1.35 to 1.54, therefore Lasix was held for two days then restarted at 40mg  BID. BNP was WNL at 63 and it was thought chronic venous insufficiency might be playing a role in her persistent lower extremity edema.   In talking with the patient today, she reports overall doing well since her last office visit. She resides at [facility] in [place] and ambulates with a walker. She reports breathing has been at baseline and denies any recent dyspnea on exertion, chest pain, palpitations, orthopnea, or PND. Does note occasional swelling along her left lower extremity but says this has improved with the use of compression stockings. She does weigh herself daily and this is been stable at 187-188 lbs by review of her weight chart. Consumes bacon and sausage on a regular basis.    Past Medical History:  Diagnosis Date  . Anxiety   . DVT, lower extremity, recurrent ([place])   . GERD (gastroesophageal reflux disease)   . Hypercholesterolemia   . Hypotension   . Hypothyroidism   . Phlebitis     Past Surgical History:  Procedure Laterality Date  . ABDOMINAL HYSTERECTOMY     partial  . CATARACT EXTRACTION W/PHACO Right 06/28/2013   Procedure: CATARACT  EXTRACTION PHACO AND INTRAOCULAR LENS PLACEMENT (IOC);  Surgeon: Tonny Branch, MD;  Location: [facility];  Service: Ophthalmology;  Laterality: Right;  CDE:13.79  . CATARACT EXTRACTION W/PHACO Left 07/26/2013   Procedure: CATARACT EXTRACTION PHACO AND INTRAOCULAR LENS PLACEMENT LEFT EYE;  Surgeon: Tonny Branch, MD;  Location: [facility];  Service: Ophthalmology;  Laterality: Left;  CDE: 11.76  . CHOLECYSTECTOMY    . ESOPHAGEAL DILATION    . TONSILLECTOMY      Current Medications: Outpatient Medications Prior to Visit  Medication Sig Dispense Refill  . acetaminophen (TYLENOL) 325 MG tablet Take 325 mg by mouth every 6 (six) hours as needed.    Marland Kitchen albuterol (PROVENTIL HFA;VENTOLIN HFA) 108 (90 Base) MCG/ACT inhaler Inhale 2 puffs into the lungs every 6 (six) hours as needed for wheezing or shortness of breath. 1 Inhaler 2  . apixaban (ELIQUIS) 2.5 MG TABS tablet Take 2.5 mg by mouth 2 (two) times daily.    . carboxymethylcellulose (REFRESH PLUS) 0.5 % SOLN Place 1 drop into both eyes 4 (four) times daily.    . fluticasone (VERAMYST) 27.5 MCG/SPRAY nasal spray Place 1 spray into the nose daily as needed for rhinitis.    . furosemide (LASIX) 40 MG tablet Take 1 tablet (40 mg total) by mouth 2 (two) times daily. 180 tablet 3  . gabapentin (NEURONTIN) 300 MG capsule Take 600 mg by mouth at bedtime.    Marland Kitchen ketoconazole (  NIZORAL) 2 % shampoo Apply 1 application topically once a week. As needed for antifungal.    . levothyroxine (SYNTHROID, LEVOTHROID) 88 MCG tablet Take 88 mcg by mouth daily before breakfast.    . LINZESS 290 MCG CAPS capsule Take 290 mcg by mouth daily before breakfast.     . NIFEdipine (PROCARDIA-XL/ADALAT CC) 30 MG 24 hr tablet Take 30 mg by mouth daily.    Marland Kitchen omeprazole (PRILOSEC) 20 MG capsule Take 20 mg by mouth daily.    Marland Kitchen oxybutynin (DITROPAN) 5 MG tablet Take 5 mg by mouth 2 (two) times daily.     . polyethylene glycol powder (GLYCOLAX/MIRALAX) powder Take 17 g by mouth daily.     .  pravastatin (PRAVACHOL) 40 MG tablet Take 40 mg by mouth at bedtime.     . risperiDONE (RISPERDAL) 0.25 MG tablet Take 0.25 mg by mouth daily.    . risperiDONE (RISPERDAL) 0.5 MG tablet Take 0.5 mg by mouth at bedtime.     Marland Kitchen rOPINIRole (REQUIP) 0.5 MG tablet Take 0.5 mg by mouth at bedtime.     . traZODone (DESYREL) 50 MG tablet Take 25 mg by mouth at bedtime.    Marland Kitchen umeclidinium-vilanterol (ANORO ELLIPTA) 62.5-25 MCG/INH AEPB Inhale 1 puff into the lungs daily.     No facility-administered medications prior to visit.      Allergies:   Patient has no known allergies.   Social History   Socioeconomic History  . Marital status: Widowed    Spouse name: Not on file  . Number of children: 0  . Years of education: Not on file  . Highest education level: Not on file  Occupational History  . Occupation: retired Chartered certified accountant: RETIRED  Social Needs  . Financial resource strain: Not on file  . Food insecurity:    Worry: Not on file    Inability: Not on file  . Transportation needs:    Medical: Not on file    Non-medical: Not on file  Tobacco Use  . Smoking status: Former Smoker    Packs/day: 0.25    Years: 15.00    Pack years: 3.75    Types: Cigarettes    Last attempt to quit: 10/22/2005    Years since quitting: 11.5  . Smokeless tobacco: Never Used  Substance and Sexual Activity  . Alcohol use: No  . Drug use: No  . Sexual activity: Never  Lifestyle  . Physical activity:    Days per week: Not on file    Minutes per session: Not on file  . Stress: Not on file  Relationships  . Social connections:    Talks on phone: Not on file    Gets together: Not on file    Attends religious service: Not on file    Active member of club or organization: Not on file    Attends meetings of clubs or organizations: Not on file    Relationship status: Not on file  Other Topics Concern  . Not on file  Social History Narrative   Moved from [place] 27 yrs ago.  Lives at [facility] in  [place], [place]   Family History:  The patient's family history includes Anemia in her brother and sister; Coronary artery disease in her other; Diabetes in her brother and mother.   Review of Systems:   Please see the history of present illness.     General:  No chills, fever, night sweats or weight changes.  Cardiovascular:  No chest  pain, dyspnea on exertion, orthopnea, palpitations, paroxysmal nocturnal dyspnea. Positive for lower extremity edema.  Dermatological: No rash, lesions/masses Respiratory: No cough, dyspnea Urologic: No hematuria, dysuria Abdominal:   No nausea, vomiting, diarrhea, bright red blood per rectum, melena, or hematemesis Neurologic:  No visual changes, wkns, changes in mental status. All other systems reviewed and are otherwise negative except as noted above.   Physical Exam:    VS:  BP 120/62   Pulse 80   Ht 5\' 2"  (1.575 m)   Wt 190 lb (86.2 kg)   SpO2 98%   BMI 34.75 kg/m    General: Well developed, elderly African American female appearing in no acute distress. Head: Normocephalic, atraumatic, sclera non-icteric, no xanthomas, nares are without discharge.  Neck: No carotid bruits. JVD not elevated.  Lungs: Respirations regular and unlabored, without wheezes or rales.  Heart: Regular rate and rhythm. No S3 or S4.  No murmur, no rubs, or gallops appreciated. Abdomen: Soft, non-tender, non-distended with normoactive bowel sounds. No hepatomegaly. No rebound/guarding. No obvious abdominal masses. Msk:  Strength and tone appear normal for age. No joint deformities or effusions. Extremities: No clubbing or cyanosis. Trace lower extremity edema.  Distal pedal pulses are 2+ bilaterally. Neuro: Alert and oriented X 3. Moves all extremities spontaneously. No focal deficits noted. Psych:  Responds to questions appropriately with a normal affect. Skin: No rashes or lesions noted  Wt Readings from Last 3 Encounters:  05/17/17 190 lb (86.2 kg)  04/14/17 190 lb (86.2  kg)  02/28/17 190 lb (86.2 kg)     Studies/Labs Reviewed:   EKG:  EKG is not ordered today.   Recent Labs: 10/27/2016: ALT 16; TSH 6.076 10/29/2016: Hemoglobin 10.0; Platelets 252 04/14/2017: B Natriuretic Peptide 63.0; BUN 28; Creatinine, Ser 1.54; Magnesium 1.9; Potassium 3.8; Sodium 130   Lipid Panel No results found for: CHOL, TRIG, HDL, CHOLHDL, VLDL, LDLCALC, LDLDIRECT  Additional studies/ records that were reviewed today include:   Echocardiogram: 10/2016 Study Conclusions  - Left ventricle: The cavity size was normal. Wall thickness was   increased in a pattern of mild LVH. Systolic function was   vigorous. The estimated ejection fraction was in the range of 65%   to 70%. Wall motion was normal; there were no regional wall   motion abnormalities. Doppler parameters are consistent with   abnormal left ventricular relaxation (grade 1 diastolic   dysfunction). Doppler parameters are consistent with high   ventricular filling pressure. - Aortic valve: Mildly calcified annulus. Mildly thickened   leaflets. Valve area (VTI): 2.54 cm^2. Valve area (Vmax): 2.45   cm^2. - Mitral valve: Mildly calcified annulus. Mildly thickened leaflets   .   Assessment:    1. Chronic diastolic heart failure ([place])   2. Medication management   3. Hyperlipidemia, unspecified hyperlipidemia type   4. History of DVT (deep vein thrombosis)      Plan:   In order of problems listed above:  1. Chronic Diastolic CHF - Recently evaluated for worsening lower extremity edema and Lasix was titrated to Lasix 60mg  in AM/40mg  in PM but creatinine trended up from 1.35 to 1.54 with this adjustment and Lasix was reduced to her baseline dose of 40 mg BID.  - She denies any recent dyspnea on exertion, orthopnea, or PND. Does have occasional edema but weight has been stable at 187-188 lbs on her home scales. In reviewing her usual dietary intake, she has been consuming quite a bit of sodium. We reviewed that  at  84 years old, we still want her to have things she enjoys, but this should be in moderation to help prevent fluid accumulation.  - would continue on Lasix 40mg  BID. Can take an additional tablet as needed for weight gain > 3 lbs overnight or > 5lbs in one week. Recheck BMET today to assess kidney function and K+ levels. Will continue with the use of compression stockings as chronic venous insufficiency is likely playing a role in her symptoms.   2. HLD - Followed by PCP.  She remains on Pravastatin 40 mg daily.  3. History of DVT - Has a known history of recurrent DVT's. Remains on Eliquis 2.5 mg twice daily.    Medication Adjustments/Labs and Tests Ordered: Current medicines are reviewed at length with the patient today.  Concerns regarding medicines are outlined above.  Medication changes, Labs and Tests ordered today are listed in the Patient Instructions below. Patient Instructions  Medication Instructions:  Your physician recommends that you continue on your current medications as directed. Please refer to the Current Medication list given to you today.  Labwork: Your physician recommends that you return for lab work in: Today   Testing/Procedures: NONE   Follow-Up: Your physician recommends that you schedule a follow-up appointment in: 2-3 Months   Any Other Special Instructions Will Be Listed Below (If Applicable).  If you need a refill on your cardiac medications before your next appointment, please call your pharmacy.  Thank you for choosing [facility]!    Signed, Erma Heritage, PA-C  05/17/2017 5:44 PM    [facility] [address] [address] [place], [place] [address] Phone: [phone]

## 2017-05-17 ENCOUNTER — Encounter: Payer: Self-pay | Admitting: Student

## 2017-05-17 ENCOUNTER — Ambulatory Visit (INDEPENDENT_AMBULATORY_CARE_PROVIDER_SITE_OTHER): Payer: Medicare Other | Admitting: Student

## 2017-05-17 VITALS — BP 120/62 | HR 80 | Ht 62.0 in | Wt 190.0 lb

## 2017-05-17 DIAGNOSIS — I5032 Chronic diastolic (congestive) heart failure: Secondary | ICD-10-CM

## 2017-05-17 DIAGNOSIS — Z86718 Personal history of other venous thrombosis and embolism: Secondary | ICD-10-CM | POA: Diagnosis not present

## 2017-05-17 DIAGNOSIS — Z79899 Other long term (current) drug therapy: Secondary | ICD-10-CM

## 2017-05-17 DIAGNOSIS — E785 Hyperlipidemia, unspecified: Secondary | ICD-10-CM

## 2017-05-17 NOTE — Patient Instructions (Signed)
Medication Instructions:  Your physician recommends that you continue on your current medications as directed. Please refer to the Current Medication list given to you today.   Labwork: Your physician recommends that you return for lab work in: Today   Testing/Procedures: NONE   Follow-Up: Your physician recommends that you schedule a follow-up appointment in: 2-3 Months    Any Other Special Instructions Will Be Listed Below (If Applicable).     If you need a refill on your cardiac medications before your next appointment, please call your pharmacy.  Thank you for choosing [facility]!

## 2017-06-30 DIAGNOSIS — M79675 Pain in left toe(s): Secondary | ICD-10-CM | POA: Diagnosis not present

## 2017-06-30 DIAGNOSIS — L03032 Cellulitis of left toe: Secondary | ICD-10-CM | POA: Diagnosis not present

## 2017-06-30 DIAGNOSIS — M79672 Pain in left foot: Secondary | ICD-10-CM | POA: Diagnosis not present

## 2017-06-30 DIAGNOSIS — L600 Ingrowing nail: Secondary | ICD-10-CM | POA: Diagnosis not present

## 2017-07-14 DIAGNOSIS — I739 Peripheral vascular disease, unspecified: Secondary | ICD-10-CM | POA: Diagnosis not present

## 2017-07-20 ENCOUNTER — Encounter: Payer: Self-pay | Admitting: Cardiology

## 2017-07-20 ENCOUNTER — Ambulatory Visit (INDEPENDENT_AMBULATORY_CARE_PROVIDER_SITE_OTHER): Payer: Medicare Other | Admitting: Cardiology

## 2017-07-20 VITALS — BP 122/58 | HR 98 | Ht 62.0 in | Wt 190.0 lb

## 2017-07-20 DIAGNOSIS — I5032 Chronic diastolic (congestive) heart failure: Secondary | ICD-10-CM | POA: Diagnosis not present

## 2017-07-20 DIAGNOSIS — N179 Acute kidney failure, unspecified: Secondary | ICD-10-CM | POA: Diagnosis not present

## 2017-07-20 DIAGNOSIS — I872 Venous insufficiency (chronic) (peripheral): Secondary | ICD-10-CM | POA: Diagnosis not present

## 2017-07-20 NOTE — Progress Notes (Signed)
Clinical Summary Christine Hancock is a 84 y.o.female seen today for follow up of the following medical problems.   1.Chronic diasotlic HF - echo 10/5282 LVEF 65-70%, no WMAs, grade I diastoilc dysfunction w/ high LA pressure - history of chronic LE edema and venous insufficiency   - we had increased lasix to 60mg  in AM and 40mg  in PM previusly, uptrend in Cr and was lowered back to 40mg  bid. Suspect edema is due to combined diastolic HF and venous insufficiency   Past Medical History:  Diagnosis Date  . Anxiety   . DVT, lower extremity, recurrent ([place])   . GERD (gastroesophageal reflux disease)   . Hypercholesterolemia   . Hypotension   . Hypothyroidism   . Phlebitis      No Known Allergies   Current Outpatient Medications  Medication Sig Dispense Refill  . acetaminophen (TYLENOL) 325 MG tablet Take 325 mg by mouth every 6 (six) hours as needed.    Marland Kitchen albuterol (PROVENTIL HFA;VENTOLIN HFA) 108 (90 Base) MCG/ACT inhaler Inhale 2 puffs into the lungs every 6 (six) hours as needed for wheezing or shortness of breath. 1 Inhaler 2  . apixaban (ELIQUIS) 2.5 MG TABS tablet Take 2.5 mg by mouth 2 (two) times daily.    . carboxymethylcellulose (REFRESH PLUS) 0.5 % SOLN Place 1 drop into both eyes 4 (four) times daily.    . fluticasone (VERAMYST) 27.5 MCG/SPRAY nasal spray Place 1 spray into the nose daily as needed for rhinitis.    . furosemide (LASIX) 40 MG tablet Take 1 tablet (40 mg total) by mouth 2 (two) times daily. 180 tablet 3  . gabapentin (NEURONTIN) 300 MG capsule Take 600 mg by mouth at bedtime.    Marland Kitchen ketoconazole (NIZORAL) 2 % shampoo Apply 1 application topically once a week. As needed for antifungal.    . levothyroxine (SYNTHROID, LEVOTHROID) 88 MCG tablet Take 88 mcg by mouth daily before breakfast.    . LINZESS 290 MCG CAPS capsule Take 290 mcg by mouth daily before breakfast.     . NIFEdipine (PROCARDIA-XL/ADALAT CC) 30 MG 24 hr tablet Take 30 mg by mouth daily.    Marland Kitchen  omeprazole (PRILOSEC) 20 MG capsule Take 20 mg by mouth daily.    Marland Kitchen oxybutynin (DITROPAN) 5 MG tablet Take 5 mg by mouth 2 (two) times daily.     . polyethylene glycol powder (GLYCOLAX/MIRALAX) powder Take 17 g by mouth daily.     . pravastatin (PRAVACHOL) 40 MG tablet Take 40 mg by mouth at bedtime.     . risperiDONE (RISPERDAL) 0.25 MG tablet Take 0.25 mg by mouth daily.    . risperiDONE (RISPERDAL) 0.5 MG tablet Take 0.5 mg by mouth at bedtime.     Marland Kitchen rOPINIRole (REQUIP) 0.5 MG tablet Take 0.5 mg by mouth at bedtime.     . traZODone (DESYREL) 50 MG tablet Take 25 mg by mouth at bedtime.    Marland Kitchen umeclidinium-vilanterol (ANORO ELLIPTA) 62.5-25 MCG/INH AEPB Inhale 1 puff into the lungs daily.     No current facility-administered medications for this visit.      Past Surgical History:  Procedure Laterality Date  . ABDOMINAL HYSTERECTOMY     partial  . CATARACT EXTRACTION W/PHACO Right 06/28/2013   Procedure: CATARACT EXTRACTION PHACO AND INTRAOCULAR LENS PLACEMENT (IOC);  Surgeon: Tonny , MD;  Location: [facility];  Service: Ophthalmology;  Laterality: Right;  CDE:13.79  . CATARACT EXTRACTION W/PHACO Left 07/26/2013   Procedure: CATARACT EXTRACTION PHACO AND  INTRAOCULAR LENS PLACEMENT LEFT EYE;  Surgeon: Tonny Salimatou Simone, MD;  Location: [facility];  Service: Ophthalmology;  Laterality: Left;  CDE: 11.76  . CHOLECYSTECTOMY    . ESOPHAGEAL DILATION    . TONSILLECTOMY       No Known Allergies    Family History  Problem Relation Age of Onset  . Diabetes Mother   . Diabetes Brother   . Anemia Brother   . Anemia Sister   . Coronary artery disease Other      Social History Christine Hancock reports that she quit smoking about 11 years ago. Her smoking use included cigarettes. She has a 3.75 pack-year smoking history. She has never used smokeless tobacco. Christine Hancock reports that she does not drink alcohol.   Review of Systems CONSTITUTIONAL: No weight loss, fever, chills, weakness or fatigue.  HEENT:  Eyes: No visual loss, blurred vision, double vision or yellow sclerae.No hearing loss, sneezing, congestion, runny nose or sore throat.  SKIN: No rash or itching.  CARDIOVASCULAR: no chest pain, no palpitatoins.  RESPIRATORY: No shortness of breath, cough or sputum.  GASTROINTESTINAL: No anorexia, nausea, vomiting or diarrhea. No abdominal pain or blood.  GENITOURINARY: No burning on urination, no polyuria NEUROLOGICAL: No headache, dizziness, syncope, paralysis, ataxia, numbness or tingling in the extremities. No change in bowel or bladder control.  MUSCULOSKELETAL: No muscle, back pain, joint pain or stiffness.  LYMPHATICS: No enlarged nodes. No history of splenectomy.  PSYCHIATRIC: No history of depression or anxiety.  ENDOCRINOLOGIC: No reports of sweating, cold or heat intolerance. No polyuria or polydipsia.  Marland Kitchen   Physical Examination Vitals:   07/20/17 1315  BP: (!) 122/58  Pulse: 98  SpO2: 94%   Vitals:   07/20/17 1315  Weight: 190 lb (86.2 kg)  Height: 5\' 2"  (1.575 m)    Gen: resting comfortably, no acute distress HEENT: no scleral icterus, pupils equal round and reactive, no palptable cervical adenopathy,  CV: RRR, no m/r/g, no jvd Resp: Clear to auscultation bilaterally GI: abdomen is soft, non-tender, non-distended, normal bowel sounds, no hepatosplenomegaly MSK: extremities are warm, 1+ bilateral LE edema.  Skin: warm, no rash Neuro:  no focal deficits Psych: appropriate affect   Diagnostic Studies  10/2016 Echo Study Conclusions  - Left ventricle: The cavity size was normal. Wall thickness was increased in a pattern of mild LVH. Systolic function was vigorous. The estimated ejection fraction was in the range of 65% to 70%. Wall motion was normal; there were no regional wall motion abnormalities. Doppler parameters are consistent with abnormal left ventricular relaxation (grade 1 diastolic dysfunction). Doppler parameters are consistent with  high ventricular filling pressure. - Aortic valve: Mildly calcified annulus. Mildly thickened leaflets. Valve area (VTI): 2.54 cm^2. Valve area (Vmax): 2.45 cm^2. - Mitral valve: Mildly calcified annulus. Mildly thickened leaflets    Assessment and Plan   1. Chronic diastolic HF/Venous insufficency - ongoing LE edema, recent increase in her diuretics did not improve and led to AKI. Suspect component of venous insuffiency. Despit some LE edema no cardiopulmonary symptoms.  - continue lasix at 40mg  bid, check BMET/Mg.   2. AKI - repeat labs     Arnoldo Lenis, M.D.

## 2017-07-20 NOTE — Patient Instructions (Signed)
Your physician recommends that you schedule a follow-up appointment in: 4 MONTHS WITH DR BRANCH  Your physician recommends that you continue on your current medications as directed. Please refer to the Current Medication list given to you today.  Your physician recommends that you return for lab work BMP/MG  Thank you for choosing [facility]!!    

## 2017-07-23 ENCOUNTER — Encounter: Payer: Self-pay | Admitting: Cardiology

## 2017-08-09 DIAGNOSIS — J9611 Chronic respiratory failure with hypoxia: Secondary | ICD-10-CM | POA: Diagnosis not present

## 2017-08-09 DIAGNOSIS — I5032 Chronic diastolic (congestive) heart failure: Secondary | ICD-10-CM | POA: Diagnosis not present

## 2017-08-09 DIAGNOSIS — I10 Essential (primary) hypertension: Secondary | ICD-10-CM | POA: Diagnosis not present

## 2017-08-09 DIAGNOSIS — F419 Anxiety disorder, unspecified: Secondary | ICD-10-CM | POA: Diagnosis not present

## 2017-08-15 DIAGNOSIS — R609 Edema, unspecified: Secondary | ICD-10-CM | POA: Diagnosis not present

## 2017-08-15 DIAGNOSIS — Z7901 Long term (current) use of anticoagulants: Secondary | ICD-10-CM | POA: Diagnosis not present

## 2017-08-15 DIAGNOSIS — I10 Essential (primary) hypertension: Secondary | ICD-10-CM | POA: Diagnosis not present

## 2017-08-15 DIAGNOSIS — K219 Gastro-esophageal reflux disease without esophagitis: Secondary | ICD-10-CM | POA: Diagnosis not present

## 2017-08-15 DIAGNOSIS — Z79899 Other long term (current) drug therapy: Secondary | ICD-10-CM | POA: Diagnosis not present

## 2017-08-15 DIAGNOSIS — E039 Hypothyroidism, unspecified: Secondary | ICD-10-CM | POA: Diagnosis not present

## 2017-08-15 DIAGNOSIS — M79641 Pain in right hand: Secondary | ICD-10-CM | POA: Diagnosis not present

## 2017-08-15 DIAGNOSIS — Z87891 Personal history of nicotine dependence: Secondary | ICD-10-CM | POA: Diagnosis not present

## 2017-08-15 DIAGNOSIS — E785 Hyperlipidemia, unspecified: Secondary | ICD-10-CM | POA: Diagnosis not present

## 2017-08-15 DIAGNOSIS — Z86718 Personal history of other venous thrombosis and embolism: Secondary | ICD-10-CM | POA: Diagnosis not present

## 2017-08-15 DIAGNOSIS — M19041 Primary osteoarthritis, right hand: Secondary | ICD-10-CM | POA: Diagnosis not present

## 2017-08-24 ENCOUNTER — Encounter (HOSPITAL_COMMUNITY): Payer: Self-pay | Admitting: *Deleted

## 2017-08-24 ENCOUNTER — Emergency Department (HOSPITAL_COMMUNITY): Payer: Medicare Other

## 2017-08-24 ENCOUNTER — Other Ambulatory Visit: Payer: Self-pay

## 2017-08-24 ENCOUNTER — Inpatient Hospital Stay (HOSPITAL_COMMUNITY)
Admission: EM | Admit: 2017-08-24 | Discharge: 2017-08-29 | DRG: 378 | Disposition: A | Discharge status: Skilled Nursing Facility | Payer: Medicare Other | Attending: Pulmonary Disease | Admitting: Pulmonary Disease

## 2017-08-24 DIAGNOSIS — M7989 Other specified soft tissue disorders: Secondary | ICD-10-CM | POA: Diagnosis not present

## 2017-08-24 DIAGNOSIS — D500 Iron deficiency anemia secondary to blood loss (chronic): Secondary | ICD-10-CM | POA: Diagnosis present

## 2017-08-24 DIAGNOSIS — N183 Chronic kidney disease, stage 3 unspecified: Secondary | ICD-10-CM | POA: Diagnosis present

## 2017-08-24 DIAGNOSIS — I5032 Chronic diastolic (congestive) heart failure: Secondary | ICD-10-CM | POA: Diagnosis not present

## 2017-08-24 DIAGNOSIS — I808 Phlebitis and thrombophlebitis of other sites: Secondary | ICD-10-CM | POA: Diagnosis not present

## 2017-08-24 DIAGNOSIS — R410 Disorientation, unspecified: Secondary | ICD-10-CM

## 2017-08-24 DIAGNOSIS — K922 Gastrointestinal hemorrhage, unspecified: Principal | ICD-10-CM | POA: Diagnosis present

## 2017-08-24 DIAGNOSIS — K219 Gastro-esophageal reflux disease without esophagitis: Secondary | ICD-10-CM | POA: Diagnosis present

## 2017-08-24 DIAGNOSIS — R42 Dizziness and giddiness: Secondary | ICD-10-CM | POA: Diagnosis not present

## 2017-08-24 DIAGNOSIS — M19041 Primary osteoarthritis, right hand: Secondary | ICD-10-CM

## 2017-08-24 DIAGNOSIS — Z66 Do not resuscitate: Secondary | ICD-10-CM | POA: Diagnosis present

## 2017-08-24 DIAGNOSIS — Z87891 Personal history of nicotine dependence: Secondary | ICD-10-CM

## 2017-08-24 DIAGNOSIS — E7800 Pure hypercholesterolemia, unspecified: Secondary | ICD-10-CM | POA: Diagnosis present

## 2017-08-24 DIAGNOSIS — R4182 Altered mental status, unspecified: Secondary | ICD-10-CM

## 2017-08-24 DIAGNOSIS — Z79899 Other long term (current) drug therapy: Secondary | ICD-10-CM

## 2017-08-24 DIAGNOSIS — J9611 Chronic respiratory failure with hypoxia: Secondary | ICD-10-CM | POA: Diagnosis not present

## 2017-08-24 DIAGNOSIS — F0390 Unspecified dementia without behavioral disturbance: Secondary | ICD-10-CM | POA: Diagnosis present

## 2017-08-24 DIAGNOSIS — Z7951 Long term (current) use of inhaled steroids: Secondary | ICD-10-CM

## 2017-08-24 DIAGNOSIS — R609 Edema, unspecified: Secondary | ICD-10-CM | POA: Diagnosis not present

## 2017-08-24 DIAGNOSIS — D509 Iron deficiency anemia, unspecified: Secondary | ICD-10-CM | POA: Diagnosis not present

## 2017-08-24 DIAGNOSIS — D649 Anemia, unspecified: Secondary | ICD-10-CM | POA: Diagnosis not present

## 2017-08-24 DIAGNOSIS — R404 Transient alteration of awareness: Secondary | ICD-10-CM | POA: Diagnosis not present

## 2017-08-24 DIAGNOSIS — F419 Anxiety disorder, unspecified: Secondary | ICD-10-CM | POA: Diagnosis not present

## 2017-08-24 DIAGNOSIS — F259 Schizoaffective disorder, unspecified: Secondary | ICD-10-CM | POA: Diagnosis present

## 2017-08-24 DIAGNOSIS — Z86718 Personal history of other venous thrombosis and embolism: Secondary | ICD-10-CM

## 2017-08-24 DIAGNOSIS — E039 Hypothyroidism, unspecified: Secondary | ICD-10-CM | POA: Diagnosis present

## 2017-08-24 DIAGNOSIS — R918 Other nonspecific abnormal finding of lung field: Secondary | ICD-10-CM | POA: Diagnosis not present

## 2017-08-24 DIAGNOSIS — R531 Weakness: Secondary | ICD-10-CM | POA: Diagnosis not present

## 2017-08-24 DIAGNOSIS — Z7901 Long term (current) use of anticoagulants: Secondary | ICD-10-CM

## 2017-08-24 DIAGNOSIS — K5900 Constipation, unspecified: Secondary | ICD-10-CM | POA: Diagnosis not present

## 2017-08-24 LAB — COMPREHENSIVE METABOLIC PANEL
ALK PHOS: 147 U/L — AB (ref 38–126)
ALT: 18 U/L (ref 0–44)
ANION GAP: 9 (ref 5–15)
AST: 26 U/L (ref 15–41)
Albumin: 3.5 g/dL (ref 3.5–5.0)
BUN: 32 mg/dL — ABNORMAL HIGH (ref 8–23)
CALCIUM: 8.7 mg/dL — AB (ref 8.9–10.3)
CHLORIDE: 100 mmol/L (ref 98–111)
CO2: 23 mmol/L (ref 22–32)
Creatinine, Ser: 1.41 mg/dL — ABNORMAL HIGH (ref 0.44–1.00)
GFR calc Af Amer: 35 mL/min — ABNORMAL LOW (ref >60)
GFR calc non Af Amer: 30 mL/min — ABNORMAL LOW (ref >60)
GLUCOSE: 100 mg/dL — AB (ref 70–99)
Potassium: 4.2 mmol/L (ref 3.5–5.1)
SODIUM: 132 mmol/L — AB (ref 135–145)
Total Bilirubin: 0.4 mg/dL (ref 0.3–1.2)
Total Protein: 8.1 g/dL (ref 6.5–8.1)

## 2017-08-24 LAB — CBC WITH DIFFERENTIAL/PLATELET
BASOS ABS: 0 10*3/uL (ref 0.0–0.1)
Basophils Relative: 0 %
EOS ABS: 0.1 10*3/uL (ref 0.0–0.7)
EOS PCT: 2 %
HCT: 25.3 % — ABNORMAL LOW (ref 36.0–46.0)
HEMOGLOBIN: 7.4 g/dL — AB (ref 12.0–15.0)
LYMPHS ABS: 2 10*3/uL (ref 0.7–4.0)
LYMPHS PCT: 27 %
MCH: 22 pg — AB (ref 26.0–34.0)
MCHC: 29.2 g/dL — ABNORMAL LOW (ref 30.0–36.0)
MCV: 75.3 fL — ABNORMAL LOW (ref 78.0–100.0)
Monocytes Absolute: 0.8 10*3/uL (ref 0.1–1.0)
Monocytes Relative: 11 %
NEUTROS PCT: 60 %
Neutro Abs: 4.4 10*3/uL (ref 1.7–7.7)
PLATELETS: 333 10*3/uL (ref 150–400)
RBC: 3.36 MIL/uL — AB (ref 3.87–5.11)
RDW: 17.7 % — ABNORMAL HIGH (ref 11.5–15.5)
WBC: 7.3 10*3/uL (ref 4.0–10.5)

## 2017-08-24 LAB — IRON AND TIBC
IRON: 15 ug/dL — AB (ref 28–170)
Saturation Ratios: 3 % — ABNORMAL LOW (ref 10.4–31.8)
TIBC: 447 ug/dL (ref 250–450)
UIBC: 432 ug/dL

## 2017-08-24 LAB — POC OCCULT BLOOD, ED: Fecal Occult Bld: POSITIVE — AB

## 2017-08-24 LAB — RETICULOCYTES
RBC.: 3.35 MIL/uL — ABNORMAL LOW (ref 3.87–5.11)
RETIC COUNT ABSOLUTE: 60.3 10*3/uL (ref 19.0–186.0)
Retic Ct Pct: 1.8 % (ref 0.4–3.1)

## 2017-08-24 LAB — FERRITIN: Ferritin: 9 ng/mL — ABNORMAL LOW (ref 11–307)

## 2017-08-24 LAB — TROPONIN I: Troponin I: 0.05 ng/mL (ref <0.03)

## 2017-08-24 LAB — FOLATE: FOLATE: 32 ng/mL (ref >5.9)

## 2017-08-24 LAB — PREPARE RBC (CROSSMATCH)

## 2017-08-24 LAB — VITAMIN B12: Vitamin B-12: 493 pg/mL (ref 180–914)

## 2017-08-24 LAB — CBG MONITORING, ED: Glucose-Capillary: 105 mg/dL — ABNORMAL HIGH (ref 70–99)

## 2017-08-24 LAB — MRSA PCR SCREENING: MRSA by PCR: NEGATIVE

## 2017-08-24 LAB — ABO/RH: ABO/RH(D): A POS

## 2017-08-24 MED ORDER — ALBUTEROL SULFATE (2.5 MG/3ML) 0.083% IN NEBU: 2.5000 mg | INHALATION_SOLUTION | Freq: Four times a day (QID) | RESPIRATORY_TRACT | Status: DC | PRN

## 2017-08-24 MED ORDER — TRAZODONE HCL 50 MG PO TABS
25.0000 mg | ORAL_TABLET | Freq: Every day | ORAL | Status: DC
  Administered 2017-08-24 – 2017-08-28 (×5): 25 mg via ORAL
  Filled 2017-08-24 (×5): qty 1

## 2017-08-24 MED ORDER — SODIUM CHLORIDE 0.9 % IV SOLN
250.0000 mL | INTRAVENOUS | Status: DC | PRN
Start: 2017-08-24 — End: 2017-08-29
  Administered 2017-08-24: 250 mL via INTRAVENOUS

## 2017-08-24 MED ORDER — RISPERIDONE 0.5 MG PO TABS
0.5000 mg | ORAL_TABLET | Freq: Every day | ORAL | Status: DC
  Administered 2017-08-24 – 2017-08-28 (×5): 0.5 mg via ORAL
  Filled 2017-08-24 (×5): qty 1

## 2017-08-24 MED ORDER — GABAPENTIN 300 MG PO CAPS
600.0000 mg | ORAL_CAPSULE | Freq: Every day | ORAL | Status: DC
  Administered 2017-08-24 – 2017-08-28 (×5): 600 mg via ORAL
  Filled 2017-08-24 (×5): qty 2

## 2017-08-24 MED ORDER — ROPINIROLE HCL 0.25 MG PO TABS
0.5000 mg | ORAL_TABLET | Freq: Every day | ORAL | Status: DC
  Administered 2017-08-24 – 2017-08-28 (×5): 0.5 mg via ORAL
  Filled 2017-08-24 (×5): qty 2

## 2017-08-24 MED ORDER — FUROSEMIDE 40 MG PO TABS
40.0000 mg | ORAL_TABLET | Freq: Two times a day (BID) | ORAL | Status: DC
  Administered 2017-08-24 – 2017-08-29 (×10): 40 mg via ORAL
  Filled 2017-08-24 (×10): qty 1

## 2017-08-24 MED ORDER — ALBUTEROL SULFATE HFA 108 (90 BASE) MCG/ACT IN AERS: 2.0000 | INHALATION_SPRAY | Freq: Four times a day (QID) | RESPIRATORY_TRACT | Status: DC | PRN

## 2017-08-24 MED ORDER — PANTOPRAZOLE SODIUM 40 MG IV SOLR: 40.0000 mg | Freq: Two times a day (BID) | INTRAVENOUS | Status: DC

## 2017-08-24 MED ORDER — PANTOPRAZOLE SODIUM 40 MG IV SOLR
80.0000 mg | Freq: Once | INTRAVENOUS | Status: AC
  Administered 2017-08-24: 80 mg via INTRAVENOUS
  Filled 2017-08-24: qty 40

## 2017-08-24 MED ORDER — PRAVASTATIN SODIUM 40 MG PO TABS
40.0000 mg | ORAL_TABLET | Freq: Every day | ORAL | Status: DC
  Administered 2017-08-24 – 2017-08-28 (×5): 40 mg via ORAL
  Filled 2017-08-24 (×5): qty 1

## 2017-08-24 MED ORDER — RISPERIDONE 0.5 MG PO TABS
0.2500 mg | ORAL_TABLET | Freq: Every day | ORAL | Status: DC
  Administered 2017-08-25 – 2017-08-29 (×5): 0.25 mg via ORAL
  Filled 2017-08-24 (×5): qty 1

## 2017-08-24 MED ORDER — SODIUM CHLORIDE 0.9 % IV BOLUS
500.0000 mL | Freq: Once | INTRAVENOUS | Status: AC
  Administered 2017-08-24: 500 mL via INTRAVENOUS

## 2017-08-24 MED ORDER — SODIUM CHLORIDE 0.9% FLUSH: 3.0000 mL | INTRAVENOUS | Status: DC | PRN

## 2017-08-24 MED ORDER — OXYBUTYNIN CHLORIDE 5 MG PO TABS
5.0000 mg | ORAL_TABLET | Freq: Every day | ORAL | Status: DC
  Administered 2017-08-25 – 2017-08-29 (×5): 5 mg via ORAL
  Filled 2017-08-24 (×6): qty 1

## 2017-08-24 MED ORDER — SODIUM CHLORIDE 0.9 % IV SOLN: 10.0000 mL/h | Freq: Once | INTRAVENOUS | Status: DC

## 2017-08-24 MED ORDER — SODIUM CHLORIDE 0.9 % IV SOLN
8.0000 mg/h | INTRAVENOUS | Status: DC
  Filled 2017-08-24 (×3): qty 80

## 2017-08-24 MED ORDER — LEVOTHYROXINE SODIUM 88 MCG PO TABS
88.0000 ug | ORAL_TABLET | Freq: Every day | ORAL | Status: DC
  Administered 2017-08-25 – 2017-08-29 (×5): 88 ug via ORAL
  Filled 2017-08-24 (×5): qty 1

## 2017-08-24 MED ORDER — ORAL CARE MOUTH RINSE
15.0000 mL | Freq: Two times a day (BID) | OROMUCOSAL | Status: DC
  Administered 2017-08-24 – 2017-08-29 (×8): 15 mL via OROMUCOSAL

## 2017-08-24 MED ORDER — SODIUM CHLORIDE 0.9% FLUSH
3.0000 mL | Freq: Two times a day (BID) | INTRAVENOUS | Status: DC
Start: 2017-08-24 — End: 2017-08-29
  Administered 2017-08-25 – 2017-08-28 (×8): 3 mL via INTRAVENOUS

## 2017-08-24 MED ORDER — GUAIFENESIN-DM 100-10 MG/5ML PO SYRP
5.0000 mL | ORAL_SOLUTION | ORAL | Status: DC | PRN
  Administered 2017-08-24: 5 mL via ORAL
  Filled 2017-08-24: qty 5

## 2017-08-24 MED ORDER — NIFEDIPINE ER OSMOTIC RELEASE 30 MG PO TB24
30.0000 mg | ORAL_TABLET | Freq: Every day | ORAL | Status: DC
  Administered 2017-08-25 – 2017-08-29 (×5): 30 mg via ORAL
  Filled 2017-08-24 (×5): qty 1

## 2017-08-24 NOTE — ED Notes (Signed)
Pt able to answer questions appropriately, pt alert and oriented to age, year and name

## 2017-08-24 NOTE — ED Notes (Signed)
Patient transported to CT 

## 2017-08-24 NOTE — ED Notes (Signed)
When asked pt for reason she is being seen for today, pt respond due to her hand swelling and pain that has been ongoing for several weeks and dx with arthritis

## 2017-08-24 NOTE — ED Notes (Signed)
Pt has returned from xray

## 2017-08-24 NOTE — ED Notes (Signed)
Patient transported to X-ray 

## 2017-08-24 NOTE — H&P (Signed)
History and Physical    Christine Hancock ZDG:[id number] DOB: 01/23/1918 DOA: 08/24/2017  PCP: Sinda Du, MD  Patient coming from: SNF  Chief Complaint:   weakness  HPI: Christine Hancock is a 84 y.o. female with medical history significant of dementia, lives at snf sent in for weakness.  Pt found to have heme pos stool with hgb of 7.4.  On rectal stool brown, no melena or brb.  Cannot get accurate useful history from pt.  Pt referred for admission for symptomatic anemia.  Review of Systems: unobtainble due to dementia   Past Medical History:  Diagnosis Date  . Anxiety   . DVT, lower extremity, recurrent ([place])   . GERD (gastroesophageal reflux disease)   . Hypercholesterolemia   . Hypotension   . Hypothyroidism   . Phlebitis     Past Surgical History:  Procedure Laterality Date  . ABDOMINAL HYSTERECTOMY     partial  . CATARACT EXTRACTION W/PHACO Right 06/28/2013   Procedure: CATARACT EXTRACTION PHACO AND INTRAOCULAR LENS PLACEMENT (IOC);  Surgeon: Tonny Branch, MD;  Location: [facility];  Service: Ophthalmology;  Laterality: Right;  CDE:13.79  . CATARACT EXTRACTION W/PHACO Left 07/26/2013   Procedure: CATARACT EXTRACTION PHACO AND INTRAOCULAR LENS PLACEMENT LEFT EYE;  Surgeon: Tonny Branch, MD;  Location: [facility];  Service: Ophthalmology;  Laterality: Left;  CDE: 11.76  . CHOLECYSTECTOMY    . ESOPHAGEAL DILATION    . TONSILLECTOMY       reports that she quit smoking about 11 years ago. Her smoking use included cigarettes. She has a 3.75 pack-year smoking history. She has never used smokeless tobacco. She reports that she does not drink alcohol or use drugs.  No Known Allergies  Family History  Problem Relation Age of Onset  . Diabetes Mother   . Diabetes Brother   . Anemia Brother   . Anemia Sister   . Coronary artery disease Other     Prior to Admission medications   Medication Sig Start Date End Date Taking? Authorizing Provider  acetaminophen (TYLENOL) 325 MG tablet Take 325  mg by mouth every 6 (six) hours as needed.   Yes [provider]  albuterol (PROVENTIL HFA;VENTOLIN HFA) 108 (90 Base) MCG/ACT inhaler Inhale 2 puffs into the lungs every 6 (six) hours as needed for wheezing or shortness of breath. 10/28/16  Yes Sinda Du, MD  apixaban (ELIQUIS) 2.5 MG TABS tablet Take 5 mg by mouth 2 (two) times daily.    Yes [provider]  carboxymethylcellulose (REFRESH PLUS) 0.5 % SOLN Place 1 drop into both eyes 4 (four) times daily.   Yes [provider]  Dextromethorphan-guaiFENesin (ROBITUSSIN DM PO) Take by mouth as needed.   Yes [provider]  furosemide (LASIX) 40 MG tablet Take 1 tablet (40 mg total) by mouth 2 (two) times daily. 04/18/17 07/21/18 Yes BranchAlphonse Guild, MD  furosemide (LASIX) 40 MG tablet Take 40 mg by mouth daily as needed.    Yes [provider]  gabapentin (NEURONTIN) 300 MG capsule Take 600 mg by mouth at bedtime.   Yes [provider]  ketoconazole (NIZORAL) 2 % shampoo Apply 1 application topically once a week. As needed for antifungal.   Yes [provider]  ketorolac (TORADOL) 10 MG tablet Take 10 mg by mouth every 6 (six) hours as needed.   Yes [provider]  levothyroxine (SYNTHROID, LEVOTHROID) 88 MCG tablet Take 88 mcg by mouth daily before breakfast.   Yes [provider]  LINZESS 290 MCG CAPS capsule Take 290 mcg by mouth daily before breakfast.  09/07/16  Yes [provider]  neomycin-polymyxin-dexameth (MAXITROL) 0.1 % OINT Place 1 application into both eyes 3 (three) times daily.   Yes [provider]  NIFEdipine (PROCARDIA-XL/ADALAT CC) 30 MG 24 hr tablet Take 30 mg by mouth daily.   Yes [provider]  omeprazole (PRILOSEC) 20 MG capsule Take 20 mg by mouth 2 (two) times daily before a meal.    Yes [provider]  oxybutynin (DITROPAN) 5 MG tablet Take 5 mg by mouth daily.  09/07/16  Yes [provider]    polyethylene glycol powder (GLYCOLAX/MIRALAX) powder Take 17 g by mouth daily.  09/20/16  Yes [provider]  pravastatin (PRAVACHOL) 40 MG tablet Take 40 mg by mouth at bedtime.  09/07/16  Yes [provider]  risperiDONE (RISPERDAL) 0.25 MG tablet Take 0.25 mg by mouth daily.   Yes [provider]  risperiDONE (RISPERDAL) 0.5 MG tablet Take 0.5 mg by mouth at bedtime.    Yes [provider]  rOPINIRole (REQUIP) 0.5 MG tablet Take 0.5 mg by mouth at bedtime.   Yes [provider]  simethicone (MYLICON) 80 MG chewable tablet Chew 80 mg by mouth every 8 (eight) hours as needed for flatulence.   Yes [provider]  traZODone (DESYREL) 50 MG tablet Take 25 mg by mouth at bedtime.   Yes [provider]  trolamine salicylate (ASPERCREME) 10 % cream Apply 1 application topically as needed for muscle pain.   Yes [provider]  umeclidinium-vilanterol (ANORO ELLIPTA) 62.5-25 MCG/INH AEPB Inhale 1 puff into the lungs daily.   Yes [provider]    Physical Exam: Vitals:   08/24/17 1622 08/24/17 1623 08/24/17 1624 08/24/17 1630  BP:    (!) 178/57  Pulse: 82 80 84   Resp:      Temp:      TempSrc:      SpO2: 94% 95% 94%   Weight:      Height:          Constitutional: NAD, calm, comfortable Vitals:   08/24/17 1622 08/24/17 1623 08/24/17 1624 08/24/17 1630  BP:    (!) 178/57  Pulse: 82 80 84   Resp:      Temp:      TempSrc:      SpO2: 94% 95% 94%   Weight:      Height:       Eyes: PERRL, lids and conjunctivae normal ENMT: Mucous membranes are moist. Posterior pharynx clear of any exudate or lesions.Normal dentition.  Neck: normal, supple, no masses, no thyromegaly Respiratory: clear to auscultation bilaterally, no wheezing, no crackles. Normal respiratory effort. No accessory muscle use.  Cardiovascular: Regular rate and rhythm, no murmurs / rubs / gallops. No extremity edema. 2+ pedal pulses. No carotid  bruits.  Abdomen: no tenderness, no masses palpated. No hepatosplenomegaly. Bowel sounds positive.  Musculoskeletal: no clubbing / cyanosis. No joint deformity upper and lower extremities. Good ROM, no contractures. Normal muscle tone.  Skin: no rashes, lesions, ulcers. No induration Neurologic: CN 2-12 grossly intact. Sensation intact, DTR normal. Strength 5/5 in all 4.  Psychiatric: not Normal judgment and insight. Alert and oriented x 1. Normal mood.    Labs on Admission: I have personally reviewed following labs and imaging studies  CBC: Recent Labs  Lab 08/24/17 1326  WBC 7.3  NEUTROABS 4.4  HGB 7.4*  HCT 25.3*  MCV 75.3*  PLT 749   Basic Metabolic Panel: Recent Labs  Lab 08/24/17 1326  NA 132*  K 4.2  CL 100  CO2 23  GLUCOSE 100*  BUN 32*  CREATININE 1.41*  CALCIUM 8.7*   GFR: Estimated Creatinine Clearance: 22.3 mL/min (A) (by C-G formula based on SCr of 1.41 mg/dL (H)). Liver Function Tests: Recent Labs  Lab 08/24/17 1326  AST 26  ALT 18  ALKPHOS 147*  BILITOT 0.4  PROT 8.1  ALBUMIN 3.5   No results for input(s): LIPASE, AMYLASE in the last 168 hours. No results for input(s): AMMONIA in the last 168 hours. Coagulation Profile: No results for input(s): INR, PROTIME in the last 168 hours. Cardiac Enzymes: Recent Labs  Lab 08/24/17 1326  TROPONINI 0.05*   BNP (last 3 results) No results for input(s): PROBNP in the last 8760 hours. HbA1C: No results for input(s): HGBA1C in the last 72 hours. CBG: Recent Labs  Lab 08/24/17 1414  GLUCAP 105*   Lipid Profile: No results for input(s): CHOL, HDL, LDLCALC, TRIG, CHOLHDL, LDLDIRECT in the last 72 hours. Thyroid Function Tests: No results for input(s): TSH, T4TOTAL, FREET4, T3FREE, THYROIDAB in the last 72 hours. Anemia Panel: No results for input(s): VITAMINB12, FOLATE, FERRITIN, TIBC, IRON, RETICCTPCT in the last 72 hours. Urine analysis:    Component Value Date/Time   COLORURINE STRAW (A)  10/27/2016 1422   APPEARANCEUR CLEAR 10/27/2016 1422   LABSPEC 1.004 (L) 10/27/2016 1422   PHURINE 5.0 10/27/2016 1422   GLUCOSEU NEGATIVE 10/27/2016 1422   HGBUR SMALL (A) 10/27/2016 1422   BILIRUBINUR NEGATIVE 10/27/2016 1422   KETONESUR NEGATIVE 10/27/2016 1422   PROTEINUR NEGATIVE 10/27/2016 1422   UROBILINOGEN 0.2 10/23/2011 1500   NITRITE NEGATIVE 10/27/2016 1422   LEUKOCYTESUR LARGE (A) 10/27/2016 1422   Sepsis Labs: !!!!!!!!!!!!!!!!!!!!!!!!!!!!!!!!!!!!!!!!!!!! @LABRCNTIP (procalcitonin:4,lacticidven:4) )No results found for this or any previous visit (from the past 240 hour(s)).   Radiological Exams on Admission: Dg Chest 1 View  Result Date: 08/24/2017 CLINICAL DATA:  84 year old female with confusion. EXAM: CHEST  1 VIEW COMPARISON:  Chest CT and radiographs 10/27/2016 and earlier. FINDINGS: [facility] view of the chest at 1501 hours. Stable lung volumes. Stable mild cardiomegaly and mediastinal contours. Calcified aortic atherosclerosis. No pneumothorax, pulmonary edema, pleural effusion or acute pulmonary opacity identified. Negative visible bowel gas pattern. Cholecystectomy clips in the right upper quadrant. No acute osseous abnormality identified. IMPRESSION: No acute cardiopulmonary abnormality. Aortic Atherosclerosis (ICD10-I70.0). Electronically Signed   By: Genevie Ann M.D.   On: 08/24/2017 15:22   Ct Head Wo Contrast  Result Date: 08/24/2017 CLINICAL DATA:  Dizziness today EXAM: CT HEAD WITHOUT CONTRAST TECHNIQUE: Contiguous axial images were obtained from the base of the skull through the vertex without intravenous contrast. COMPARISON:  10/23/2011 FINDINGS: Brain: There is atrophy and chronic small vessel disease changes. No acute intracranial abnormality. Specifically, no hemorrhage, hydrocephalus, mass lesion, acute infarction, or significant intracranial injury. Vascular: No hyperdense vessel or unexpected calcification. Skull: No acute calvarial abnormality. Sinuses/Orbits:  Visualized paranasal sinuses and mastoids clear. Orbital soft tissues unremarkable. Other: None IMPRESSION: No acute intracranial abnormality. Atrophy, chronic microvascular disease. Electronically Signed   By: Rolm Baptise M.D.   On: 08/24/2017 13:56   US Venous Img Upper Uni Right  Result Date: 08/24/2017 CLINICAL DATA:  Edema, pain.  History of lower extremity DVT. EXAM: RIGHT UPPER EXTREMITY VENOUS DOPPLER ULTRASOUND TECHNIQUE: Gray-scale sonography with graded compression, as well as color Doppler and duplex ultrasound were performed to evaluate the upper extremity deep  venous system from the level of the subclavian vein and including the jugular, axillary, basilic and upper cephalic vein. Spectral Doppler was utilized to evaluate flow at rest and with distal augmentation maneuvers. COMPARISON:  None. FINDINGS: Thrombus within deep veins:  None visualized. Compressibility of deep veins:  Normal. Duplex waveform respiratory phasicity:  Normal. Duplex waveform response to augmentation:  Normal. Venous reflux:  None visualized. Other findings: There is segmental occlusive thrombus in the cephalic vein near the elbow. Cephalic vein is patent and compressible peripherally in the forearm and more centrally. Limited images of the contralateral subclavian vein unremarkable. IMPRESSION: 1. Negative for right upper extremity DVT. 2. Segmental superficial thrombophlebitis in the cephalic vein at the level of the elbow. Electronically Signed   By: Lucrezia Europe M.D.   On: 08/24/2017 15:34   Dg Hand Complete Right  Result Date: 08/24/2017 CLINICAL DATA:  84 year old female with pain and swelling in the hand with no known injury. EXAM: RIGHT HAND - COMPLETE 3+ VIEW COMPARISON:  [facility] right hand radiographs 08/15/2017. FINDINGS: Bone mineralization is stable and normal for age. Distal radius and ulna are stable and intact. Carpal bone alignment is stable and within normal limits. Metacarpals and phalanges  are intact. Mild for age joint degeneration. No acute osseous abnormality identified. Dorsal soft tissue swelling at the level of the metacarpals has regressed compared to 08/15/2017. IMPRESSION: No acute osseous abnormality in the right hand. Electronically Signed   By: Genevie Ann M.D.   On: 08/24/2017 15:23    Old chart reviewed  Case discussed with dr Gilford Raid in the ED  Assessment/Plan 84 yo female with anemia  Principal Problem:   GIB (gastrointestinal bleeding)- transfuse 2 units.  Ck anemia panel.  Gi called advised to hold eloquis.  Family not present to discuss further evaluation.  Unsure if good candidate for any scoping.  No evidence of overt bleeding at this time.  Several months ago hgb was over 10 however.  Monitor.  bp stable.   Active Problems:   Microcytic anemia- anemia panel pending.  transfuse   Anxiety- noted   Chronic diastolic CHF (congestive heart failure) ([place])- stable, cont lasix   CKD (chronic kidney disease)- at baseline.     DVT prophylaxis: scds Code Status:  DNR Family Communication:  none Disposition Plan:  1-2 days Consults called:  GI  Admission status:  observation    Lainey Nelson A MD [facility]  If 7PM-7AM, please contact night-coverage [email or web address] Password [email or web address]  08/24/2017, 5:01 PM

## 2017-08-24 NOTE — ED Notes (Signed)
CRITICAL VALUE ALERT  Critical Value:  Trop 0.05  Date & Time Notied:  08/24/17 1435  Provider Notified: Gilford Raid  Orders Received/Actions taken: na

## 2017-08-24 NOTE — ED Triage Notes (Signed)
Pt from [facility], reported that PCP is Luan Pulling.  Reported pt with increased confusion but reported that pt is alert and oriented, reported that pt with LE weakness and swelling to right hand, but pt has been seen for the right hand swelling at [facility].    CBG 92 per [facility]  109/70, HR 75, 99% on 2L/M, R 20 No IV started by [facility]  Pt with DNR as well.

## 2017-08-24 NOTE — ED Provider Notes (Signed)
[facility] EMERGENCY DEPARTMENT Provider Note   CSN: [id number] Arrival date & time: 08/24/17  1201     History   Chief Complaint Chief Complaint  Patient presents with  . hand swelling    HPI Christine Hancock is a 84 y.o. female.  Pt presents to the ED today with weakness, ms change, and right hand swelling.  Pt is a poor historian and unable to give me any hx.  Per EMS, pt's facility wants her checked for uti and other infections.  Pt has apparently been to a different ED for her hand swelling and has been told she has arthritis.  Pt does have a hx of recurrent DVT and is on Eliquis.     Past Medical History:  Diagnosis Date  . Anxiety   . DVT, lower extremity, recurrent ([place])   . GERD (gastroesophageal reflux disease)   . Hypercholesterolemia   . Hypotension   . Hypothyroidism   . Phlebitis     Patient Active Problem List   Diagnosis Date Noted  . GIB (gastrointestinal bleeding) 08/24/2017  . Acute on chronic diastolic heart failure ([place]) 10/30/2016  . Acute lower UTI 10/27/2016  . AKI (acute kidney injury) ([place]) 10/27/2016  . Dehydration 10/24/2011  . Acute on chronic renal failure ([place]) 10/24/2011  . Failure to thrive in adult 10/24/2011  . Anemia 10/24/2011  . Anxiety 10/24/2011  . Hypothyroidism 10/24/2011    Past Surgical History:  Procedure Laterality Date  . ABDOMINAL HYSTERECTOMY     partial  . CATARACT EXTRACTION W/PHACO Right 06/28/2013   Procedure: CATARACT EXTRACTION PHACO AND INTRAOCULAR LENS PLACEMENT (IOC);  Surgeon: Tonny Branch, MD;  Location: [facility];  Service: Ophthalmology;  Laterality: Right;  CDE:13.79  . CATARACT EXTRACTION W/PHACO Left 07/26/2013   Procedure: CATARACT EXTRACTION PHACO AND INTRAOCULAR LENS PLACEMENT LEFT EYE;  Surgeon: Tonny Branch, MD;  Location: [facility];  Service: Ophthalmology;  Laterality: Left;  CDE: 11.76  . CHOLECYSTECTOMY    . ESOPHAGEAL DILATION    . TONSILLECTOMY       OB History   None      Home Medications     Prior to Admission medications   Medication Sig Start Date End Date Taking? Authorizing Provider  acetaminophen (TYLENOL) 325 MG tablet Take 325 mg by mouth every 6 (six) hours as needed.   Yes [provider]  albuterol (PROVENTIL HFA;VENTOLIN HFA) 108 (90 Base) MCG/ACT inhaler Inhale 2 puffs into the lungs every 6 (six) hours as needed for wheezing or shortness of breath. 10/28/16  Yes Sinda Du, MD  apixaban (ELIQUIS) 2.5 MG TABS tablet Take 5 mg by mouth 2 (two) times daily.    Yes [provider]  carboxymethylcellulose (REFRESH PLUS) 0.5 % SOLN Place 1 drop into both eyes 4 (four) times daily.   Yes [provider]  Dextromethorphan-guaiFENesin (ROBITUSSIN DM PO) Take by mouth as needed.   Yes [provider]  furosemide (LASIX) 40 MG tablet Take 1 tablet (40 mg total) by mouth 2 (two) times daily. 04/18/17 07/21/18 Yes BranchAlphonse Guild, MD  furosemide (LASIX) 40 MG tablet Take 40 mg by mouth daily as needed.    Yes [provider]  gabapentin (NEURONTIN) 300 MG capsule Take 600 mg by mouth at bedtime.   Yes [provider]  ketoconazole (NIZORAL) 2 % shampoo Apply 1 application topically once a week. As needed for antifungal.   Yes [provider]  ketorolac (TORADOL) 10 MG tablet Take 10 mg  by mouth every 6 (six) hours as needed.   Yes [provider]  levothyroxine (SYNTHROID, LEVOTHROID) 88 MCG tablet Take 88 mcg by mouth daily before breakfast.   Yes [provider]  LINZESS 290 MCG CAPS capsule Take 290 mcg by mouth daily before breakfast.  09/07/16  Yes [provider]  neomycin-polymyxin-dexameth (MAXITROL) 0.1 % OINT Place 1 application into both eyes 3 (three) times daily.   Yes [provider]  NIFEdipine (PROCARDIA-XL/ADALAT CC) 30 MG 24 hr tablet Take 30 mg by mouth daily.   Yes [provider]  omeprazole (PRILOSEC) 20 MG capsule Take 20 mg by mouth 2 (two) times  daily before a meal.    Yes [provider]  oxybutynin (DITROPAN) 5 MG tablet Take 5 mg by mouth daily.  09/07/16  Yes [provider]  polyethylene glycol powder (GLYCOLAX/MIRALAX) powder Take 17 g by mouth daily.  09/20/16  Yes [provider]  pravastatin (PRAVACHOL) 40 MG tablet Take 40 mg by mouth at bedtime.  09/07/16  Yes [provider]  risperiDONE (RISPERDAL) 0.25 MG tablet Take 0.25 mg by mouth daily.   Yes [provider]  risperiDONE (RISPERDAL) 0.5 MG tablet Take 0.5 mg by mouth at bedtime.    Yes [provider]  rOPINIRole (REQUIP) 0.5 MG tablet Take 0.5 mg by mouth at bedtime.   Yes [provider]  simethicone (MYLICON) 80 MG chewable tablet Chew 80 mg by mouth every 8 (eight) hours as needed for flatulence.   Yes [provider]  traZODone (DESYREL) 50 MG tablet Take 25 mg by mouth at bedtime.   Yes [provider]  trolamine salicylate (ASPERCREME) 10 % cream Apply 1 application topically as needed for muscle pain.   Yes [provider]  umeclidinium-vilanterol (ANORO ELLIPTA) 62.5-25 MCG/INH AEPB Inhale 1 puff into the lungs daily.   Yes [provider]    Family History Family History  Problem Relation Age of Onset  . Diabetes Mother   . Diabetes Brother   . Anemia Brother   . Anemia Sister   . Coronary artery disease Other     Social History Social History   Tobacco Use  . Smoking status: Former Smoker    Packs/day: 0.25    Years: 15.00    Pack years: 3.75    Types: Cigarettes    Last attempt to quit: 10/22/2005    Years since quitting: 11.8  . Smokeless tobacco: Never Used  Substance Use Topics  . Alcohol use: No  . Drug use: No     Allergies   Patient has no known allergies.   Review of Systems Review of Systems  Musculoskeletal:       Right hand swelling  All other systems reviewed and are negative.    Physical Exam Updated Vital Signs BP  139/66   Pulse 80   Temp 98.3 F (36.8 C) (Oral)   Resp 18   Ht 5\' 2"  (1.575 m)   Wt 83.5 kg (184 lb)   SpO2 96%   BMI 33.65 kg/m   Physical Exam  Constitutional: She is oriented to person, place, and time. She appears well-developed and well-nourished.  HENT:  Head: Normocephalic and atraumatic.  Right Ear: External ear normal.  Left Ear: External ear normal.  Nose: Nose normal.  Mouth/Throat: Oropharynx is clear and moist.  Eyes: Pupils are equal, round, and reactive to light. Conjunctivae and EOM are normal.  Neck: Normal range of motion. Neck  supple.  Cardiovascular: Normal rate, regular rhythm, normal heart sounds and intact distal pulses.  Pulmonary/Chest: Effort normal and breath sounds normal.  Abdominal: Soft. Bowel sounds are normal.  Genitourinary: Rectal exam shows guaiac positive stool.  Genitourinary Comments: Stool is brown.  No gross blood.  Musculoskeletal:       Right hand: She exhibits swelling.  Neurological: She is alert and oriented to person, place, and time.  Skin: Skin is warm. Capillary refill takes less than 2 seconds.  Psychiatric: She has a normal mood and affect. Her behavior is normal. Judgment and thought content normal.  Nursing note and vitals reviewed.    ED Treatments / Results  Labs (all labs ordered are listed, but only abnormal results are displayed) Labs Reviewed  CBC WITH DIFFERENTIAL/PLATELET - Abnormal; Notable for the following components:      Result Value   RBC 3.36 (*)    Hemoglobin 7.4 (*)    HCT 25.3 (*)    MCV 75.3 (*)    MCH 22.0 (*)    MCHC 29.2 (*)    RDW 17.7 (*)    All other components within normal limits  COMPREHENSIVE METABOLIC PANEL - Abnormal; Notable for the following components:   Sodium 132 (*)    Glucose, Bld 100 (*)    BUN 32 (*)    Creatinine, Ser 1.41 (*)    Calcium 8.7 (*)    Alkaline Phosphatase 147 (*)    GFR calc non Af Amer 30 (*)    GFR calc Af Amer 35 (*)    All other components within  normal limits  TROPONIN I - Abnormal; Notable for the following components:   Troponin I 0.05 (*)    All other components within normal limits  CBG MONITORING, ED - Abnormal; Notable for the following components:   Glucose-Capillary 105 (*)    All other components within normal limits  POC OCCULT BLOOD, ED - Abnormal; Notable for the following components:   Fecal Occult Bld POSITIVE (*)    All other components within normal limits  URINALYSIS, ROUTINE W REFLEX MICROSCOPIC  TYPE AND SCREEN  PREPARE RBC (CROSSMATCH)  ABO/RH    EKG EKG Interpretation  Date/Time:  Wednesday August 24 2017 14:08:52 EDT Ventricular Rate:  83 PR Interval:    QRS Duration: 125 QT Interval:  396 QTC Calculation: 466 R Axis:   -62 Text Interpretation:  Sinus rhythm RBBB and LAFB No significant change since last tracing Confirmed by Isla Pence [phone]) on 08/24/2017 3:04:47 PM   Radiology Dg Chest 1 View  Result Date: 08/24/2017 CLINICAL DATA:  84 year old female with confusion. EXAM: CHEST  1 VIEW COMPARISON:  Chest CT and radiographs 10/27/2016 and earlier. FINDINGS: [facility] view of the chest at 1501 hours. Stable lung volumes. Stable mild cardiomegaly and mediastinal contours. Calcified aortic atherosclerosis. No pneumothorax, pulmonary edema, pleural effusion or acute pulmonary opacity identified. Negative visible bowel gas pattern. Cholecystectomy clips in the right upper quadrant. No acute osseous abnormality identified. IMPRESSION: No acute cardiopulmonary abnormality. Aortic Atherosclerosis (ICD10-I70.0). Electronically Signed   By: Genevie Ann M.D.   On: 08/24/2017 15:22   Ct Head Wo Contrast  Result Date: 08/24/2017 CLINICAL DATA:  Dizziness today EXAM: CT HEAD WITHOUT CONTRAST TECHNIQUE: Contiguous axial images were obtained from the base of the skull through the vertex without intravenous contrast. COMPARISON:  10/23/2011 FINDINGS: Brain: There is atrophy and chronic small vessel disease changes. No acute  intracranial abnormality. Specifically, no hemorrhage, hydrocephalus, mass lesion, acute infarction,  or significant intracranial injury. Vascular: No hyperdense vessel or unexpected calcification. Skull: No acute calvarial abnormality. Sinuses/Orbits: Visualized paranasal sinuses and mastoids clear. Orbital soft tissues unremarkable. Other: None IMPRESSION: No acute intracranial abnormality. Atrophy, chronic microvascular disease. Electronically Signed   By: Rolm Baptise M.D.   On: 08/24/2017 13:56   US Venous Img Upper Uni Right  Result Date: 08/24/2017 CLINICAL DATA:  Edema, pain.  History of lower extremity DVT. EXAM: RIGHT UPPER EXTREMITY VENOUS DOPPLER ULTRASOUND TECHNIQUE: Gray-scale sonography with graded compression, as well as color Doppler and duplex ultrasound were performed to evaluate the upper extremity deep venous system from the level of the subclavian vein and including the jugular, axillary, basilic and upper cephalic vein. Spectral Doppler was utilized to evaluate flow at rest and with distal augmentation maneuvers. COMPARISON:  None. FINDINGS: Thrombus within deep veins:  None visualized. Compressibility of deep veins:  Normal. Duplex waveform respiratory phasicity:  Normal. Duplex waveform response to augmentation:  Normal. Venous reflux:  None visualized. Other findings: There is segmental occlusive thrombus in the cephalic vein near the elbow. Cephalic vein is patent and compressible peripherally in the forearm and more centrally. Limited images of the contralateral subclavian vein unremarkable. IMPRESSION: 1. Negative for right upper extremity DVT. 2. Segmental superficial thrombophlebitis in the cephalic vein at the level of the elbow. Electronically Signed   By: Lucrezia Europe M.D.   On: 08/24/2017 15:34   Dg Hand Complete Right  Result Date: 08/24/2017 CLINICAL DATA:  84 year old female with pain and swelling in the hand with no known injury. EXAM: RIGHT HAND - COMPLETE 3+ VIEW  COMPARISON:  [facility] right hand radiographs 08/15/2017. FINDINGS: Bone mineralization is stable and normal for age. Distal radius and ulna are stable and intact. Carpal bone alignment is stable and within normal limits. Metacarpals and phalanges are intact. Mild for age joint degeneration. No acute osseous abnormality identified. Dorsal soft tissue swelling at the level of the metacarpals has regressed compared to 08/15/2017. IMPRESSION: No acute osseous abnormality in the right hand. Electronically Signed   By: Genevie Ann M.D.   On: 08/24/2017 15:23    Procedures Procedures (including critical care time)  Medications Ordered in ED Medications  0.9 %  sodium chloride infusion (has no administration in time range)  pantoprazole (PROTONIX) 80 mg in sodium chloride 0.9 % 100 mL IVPB (has no administration in time range)  pantoprazole (PROTONIX) 80 mg in sodium chloride 0.9 % 250 mL (0.32 mg/mL) infusion (has no administration in time range)  pantoprazole (PROTONIX) injection 40 mg (has no administration in time range)  sodium chloride 0.9 % bolus 500 mL (0 mLs Intravenous Stopped 08/24/17 1559)     Initial Impression / Assessment and Plan / ED Course  I have reviewed the triage vital signs and the nursing notes.  Pertinent labs & imaging results that were available during my care of the patient were reviewed by me and considered in my medical decision making (see chart for details).  CRITICAL CARE Performed by: Isla Pence   Total critical care time: 30 minutes  Critical care time was exclusive of separately billable procedures and treating other patients.  Critical care was necessary to treat or prevent imminent or life-threatening deterioration.  Critical care was time spent personally by me on the following activities: development of treatment plan with patient and/or surrogate as well as nursing, discussions with consultants, evaluation of patient's response to  treatment, examination of patient, obtaining history from patient or surrogate,  ordering and performing treatments and interventions, ordering and review of laboratory studies, ordering and review of radiographic studies, pulse oximetry and re-evaluation of patient's condition.  Last hgb 10 in September.  Pt is sob with movement, so I ordered 2 units of  blood for transfusion.     Pt has seen Dr. Gala Romney in the distant past.  She was d/w Dr. Laural Golden (gi) who recommends holding Eliquis.  No emergent colonoscopy/egd needed.  Pt d/w Dr. Shanon Brow ([facility]) who will admit.    Final Clinical Impressions(s) / ED Diagnoses   Final diagnoses:  Swelling  Symptomatic anemia  Gastrointestinal hemorrhage, unspecified gastrointestinal hemorrhage type  On apixaban therapy  Osteoarthritis of right hand, unspecified osteoarthritis type    ED Discharge Orders    None       Isla Pence, MD 08/24/17 [id number]

## 2017-08-25 DIAGNOSIS — J9611 Chronic respiratory failure with hypoxia: Secondary | ICD-10-CM | POA: Diagnosis present

## 2017-08-25 DIAGNOSIS — F0390 Unspecified dementia without behavioral disturbance: Secondary | ICD-10-CM | POA: Diagnosis present

## 2017-08-25 DIAGNOSIS — Z86718 Personal history of other venous thrombosis and embolism: Secondary | ICD-10-CM | POA: Diagnosis not present

## 2017-08-25 DIAGNOSIS — Z87891 Personal history of nicotine dependence: Secondary | ICD-10-CM | POA: Diagnosis not present

## 2017-08-25 DIAGNOSIS — E7800 Pure hypercholesterolemia, unspecified: Secondary | ICD-10-CM | POA: Diagnosis present

## 2017-08-25 DIAGNOSIS — F259 Schizoaffective disorder, unspecified: Secondary | ICD-10-CM | POA: Diagnosis present

## 2017-08-25 DIAGNOSIS — Z7951 Long term (current) use of inhaled steroids: Secondary | ICD-10-CM | POA: Diagnosis not present

## 2017-08-25 DIAGNOSIS — F419 Anxiety disorder, unspecified: Secondary | ICD-10-CM | POA: Diagnosis present

## 2017-08-25 DIAGNOSIS — D500 Iron deficiency anemia secondary to blood loss (chronic): Secondary | ICD-10-CM | POA: Diagnosis not present

## 2017-08-25 DIAGNOSIS — Z66 Do not resuscitate: Secondary | ICD-10-CM | POA: Diagnosis present

## 2017-08-25 DIAGNOSIS — Z79899 Other long term (current) drug therapy: Secondary | ICD-10-CM | POA: Diagnosis not present

## 2017-08-25 DIAGNOSIS — E039 Hypothyroidism, unspecified: Secondary | ICD-10-CM | POA: Diagnosis present

## 2017-08-25 DIAGNOSIS — N183 Chronic kidney disease, stage 3 (moderate): Secondary | ICD-10-CM | POA: Diagnosis present

## 2017-08-25 DIAGNOSIS — K219 Gastro-esophageal reflux disease without esophagitis: Secondary | ICD-10-CM | POA: Diagnosis present

## 2017-08-25 DIAGNOSIS — I5032 Chronic diastolic (congestive) heart failure: Secondary | ICD-10-CM | POA: Diagnosis present

## 2017-08-25 DIAGNOSIS — D649 Anemia, unspecified: Secondary | ICD-10-CM | POA: Diagnosis not present

## 2017-08-25 DIAGNOSIS — K922 Gastrointestinal hemorrhage, unspecified: Secondary | ICD-10-CM | POA: Diagnosis present

## 2017-08-25 DIAGNOSIS — Z7901 Long term (current) use of anticoagulants: Secondary | ICD-10-CM | POA: Diagnosis not present

## 2017-08-25 DIAGNOSIS — K5900 Constipation, unspecified: Secondary | ICD-10-CM | POA: Diagnosis not present

## 2017-08-25 LAB — CBC
HCT: 33.2 % — ABNORMAL LOW (ref 36.0–46.0)
HEMOGLOBIN: 10.2 g/dL — AB (ref 12.0–15.0)
MCH: 24 pg — AB (ref 26.0–34.0)
MCHC: 30.7 g/dL (ref 30.0–36.0)
MCV: 78.1 fL (ref 78.0–100.0)
Platelets: 314 10*3/uL (ref 150–400)
RBC: 4.25 MIL/uL (ref 3.87–5.11)
RDW: 17.5 % — ABNORMAL HIGH (ref 11.5–15.5)
WBC: 7.6 10*3/uL (ref 4.0–10.5)

## 2017-08-25 LAB — BPAM RBC
BLOOD PRODUCT EXPIRATION DATE: 201907142359
BLOOD PRODUCT EXPIRATION DATE: 201907252359
ISSUE DATE / TIME: 201907031932
ISSUE DATE / TIME: 201907032235
UNIT TYPE AND RH: 6200
Unit Type and Rh: 6200

## 2017-08-25 LAB — TYPE AND SCREEN
ABO/RH(D): A POS
Antibody Screen: NEGATIVE
UNIT DIVISION: 0
UNIT DIVISION: 0

## 2017-08-25 LAB — BASIC METABOLIC PANEL
ANION GAP: 8 (ref 5–15)
BUN: 26 mg/dL — ABNORMAL HIGH (ref 8–23)
CO2: 26 mmol/L (ref 22–32)
Calcium: 8.8 mg/dL — ABNORMAL LOW (ref 8.9–10.3)
Chloride: 102 mmol/L (ref 98–111)
Creatinine, Ser: 1.28 mg/dL — ABNORMAL HIGH (ref 0.44–1.00)
GFR calc Af Amer: 39 mL/min — ABNORMAL LOW (ref >60)
GFR calc non Af Amer: 34 mL/min — ABNORMAL LOW (ref >60)
GLUCOSE: 112 mg/dL — AB (ref 70–99)
Potassium: 4 mmol/L (ref 3.5–5.1)
Sodium: 136 mmol/L (ref 135–145)

## 2017-08-25 MED ORDER — ACETAMINOPHEN 325 MG PO TABS
650.0000 mg | ORAL_TABLET | Freq: Four times a day (QID) | ORAL | Status: DC | PRN
  Administered 2017-08-26 – 2017-08-27 (×3): 650 mg via ORAL
  Filled 2017-08-25 (×3): qty 2

## 2017-08-25 NOTE — Progress Notes (Signed)
Pt agitated, refused vitals, bath and would not keep nasal canula in place. Will pass on to next shift in hopes of reevaluating.

## 2017-08-25 NOTE — Progress Notes (Signed)
Subjective: She was admitted with anemia.  No blood in her stool. She is chronically anticoagulated and that is being held.  She does not show any evidence of overt bleeding now.  She received 2 units of packed red blood cells and her hemoglobin is much better.  She is known to have chronic diastolic heart failure and that seems to be stable now and should be improved with better hemoglobin level.  She has chronic hypoxic respiratory failure at baseline. Objective: Vital signs in last 24 hours: Temp:  [97.4 F (36.3 C)-98.5 F (36.9 C)] 98.3 F (36.8 C) (07/04 0045) Pulse Rate:  [66-91] 86 (07/04 0045) Resp:  [18-22] 18 (07/04 0045) BP: (121-178)/(54-75) 135/73 (07/04 0045) SpO2:  [91 %-100 %] 100 % (07/04 0045) Weight:  [83.5 kg (184 lb)-91.8 kg (202 lb 6.1 oz)] 91.8 kg (202 lb 6.1 oz) (07/03 1809) Weight change:  Last BM Date: 08/24/17  Intake/Output from previous day: 07/03 0701 - 07/04 0700 In: 575.3 [I.V.:75.3; IV Piggyback:500] Out: 600 [Urine:600]  PHYSICAL EXAM General appearance: alert and Confused Resp: clear to auscultation bilaterally Cardio: regular rate and rhythm, S1, S2 normal, no murmur, click, rub or gallop GI: soft, non-tender; bowel sounds normal; no masses,  no organomegaly Extremities: She has chronic venous stasis in the extremities  Lab Results:  Results for orders placed or performed during the hospital encounter of 08/24/17 (from the past 48 hour(s))  CBC with Differential     Status: Abnormal   Collection Time: 08/24/17  1:26 PM  Result Value Ref Range   WBC 7.3 4.0 - 10.5 K/uL   RBC 3.36 (L) 3.87 - 5.11 MIL/uL   Hemoglobin 7.4 (L) 12.0 - 15.0 g/dL   HCT 25.3 (L) 36.0 - 46.0 %   MCV 75.3 (L) 78.0 - 100.0 fL   MCH 22.0 (L) 26.0 - 34.0 pg   MCHC 29.2 (L) 30.0 - 36.0 g/dL   RDW 17.7 (H) 11.5 - 15.5 %   Platelets 333 150 - 400 K/uL   Neutrophils Relative % 60 %   Neutro Abs 4.4 1.7 - 7.7 K/uL   Lymphocytes Relative 27 %   Lymphs Abs 2.0 0.7 - 4.0  K/uL   Monocytes Relative 11 %   Monocytes Absolute 0.8 0.1 - 1.0 K/uL   Eosinophils Relative 2 %   Eosinophils Absolute 0.1 0.0 - 0.7 K/uL   Basophils Relative 0 %   Basophils Absolute 0.0 0.0 - 0.1 K/uL    Comment: Performed at [facility], [address] [place],  [address]  Comprehensive metabolic panel     Status: Abnormal   Collection Time: 08/24/17  1:26 PM  Result Value Ref Range   Sodium 132 (L) 135 - 145 mmol/L   Potassium 4.2 3.5 - 5.1 mmol/L   Chloride 100 98 - 111 mmol/L    Comment: Please note change in reference range.   CO2 23 22 - 32 mmol/L   Glucose, Bld 100 (H) 70 - 99 mg/dL    Comment: Please note change in reference range.   BUN 32 (H) 8 - 23 mg/dL    Comment: Please note change in reference range.   Creatinine, Ser 1.41 (H) 0.44 - 1.00 mg/dL   Calcium 8.7 (L) 8.9 - 10.3 mg/dL   Total Protein 8.1 6.5 - 8.1 g/dL   Albumin 3.5 3.5 - 5.0 g/dL   AST 26 15 - 41 U/L   ALT 18 0 - 44 U/L    Comment: Please note  change in reference range.   Alkaline Phosphatase 147 (H) 38 - 126 U/L   Total Bilirubin 0.4 0.3 - 1.2 mg/dL   GFR calc non Af Amer 30 (L) >60 mL/min   GFR calc Af Amer 35 (L) >60 mL/min    Comment: (NOTE) The eGFR has been calculated using the CKD EPI equation. This calculation has not been validated in all clinical situations. eGFR's persistently <60 mL/min signify possible Chronic Kidney Disease.    Anion gap 9 5 - 15    Comment: Performed at [facility], [address] [place], [place] [address]  Troponin I     Status: Abnormal   Collection Time: 08/24/17  1:26 PM  Result Value Ref Range   Troponin I 0.05 (HH) <0.03 ng/mL    Comment: CRITICAL RESULT CALLED TO, READ BACK BY AND VERIFIED WITH: BDANIELS 08/24/17 @ 1435 [id number] Performed at [facility], [address], [place], [place] [address]   CBG monitoring, ED     Status: Abnormal   Collection Time: 08/24/17  2:14 PM  Result Value Ref Range   Glucose-Capillary 105 (H) 70 - 99 mg/dL   ABO/Rh     Status: None   Collection Time: 08/24/17  2:32 PM  Result Value Ref Range   ABO/RH(D)      A POS Performed at [facility], [address], [place], [place] [address]   Type and screen [facility]     Status: None   Collection Time: 08/24/17  2:48 PM  Result Value Ref Range   ABO/RH(D) A POS    Antibody Screen NEG    Sample Expiration 08/27/2017    Unit Number [id number]    Blood Component Type RED CELLS,LR    Unit division 00    Status of Unit ISSUED,FINAL    Transfusion Status OK TO TRANSFUSE    Crossmatch Result Compatible    Unit Number [id number]    Blood Component Type RED CELLS,LR    Unit division 00    Status of Unit ISSUED,FINAL    Transfusion Status OK TO TRANSFUSE    Crossmatch Result      Compatible Performed at [facility], [address], [place], [place] [address]   Prepare RBC     Status: None   Collection Time: 08/24/17  2:48 PM  Result Value Ref Range   Order Confirmation      ORDER PROCESSED BY BLOOD BANK Performed at [facility], [address], [place], [place] [address]   POC occult blood, ED Provider will collect     Status: Abnormal   Collection Time: 08/24/17  3:21 PM  Result Value Ref Range   Fecal Occult Bld POSITIVE (A) NEGATIVE  Vitamin B12     Status: None   Collection Time: 08/24/17  5:12 PM  Result Value Ref Range   Vitamin B-12 493 180 - 914 pg/mL    Comment: (NOTE) This assay is not validated for testing neonatal or myeloproliferative syndrome specimens for Vitamin B12 levels. Performed at [facility], [place] [address], [place], [place] [address]   Folate     Status: None   Collection Time: 08/24/17  5:12 PM  Result Value Ref Range   Folate 32.0 >5.9 ng/mL    Comment: Performed at [facility] [address], [place], [place] [address]  Iron and TIBC     Status: Abnormal   Collection Time: 08/24/17  5:12 PM  Result Value Ref Range   Iron  15 (L) 28 - 170 ug/dL   TIBC 447 250 - 450 ug/dL    Saturation Ratios 3 (L) 10.4 - 31.8 %   UIBC 432 ug/dL    Comment: Performed at [facility], [place] [address], [place], [place] [address]  Ferritin     Status: Abnormal   Collection Time: 08/24/17  5:12 PM  Result Value Ref Range   Ferritin 9 (L) 11 - 307 ng/mL    Comment: Performed at [facility], [place] [address] [place], [place] [address]  Reticulocytes     Status: Abnormal   Collection Time: 08/24/17  5:12 PM  Result Value Ref Range   Retic Ct Pct 1.8 0.4 - 3.1 %   RBC. 3.35 (L) 3.87 - 5.11 MIL/uL   Retic Count, Absolute 60.3 19.0 - 186.0 K/uL    Comment: Performed at [facility], [address], [place], [place] [address]  MRSA PCR Screening     Status: None   Collection Time: 08/24/17  7:51 PM  Result Value Ref Range   MRSA by PCR NEGATIVE NEGATIVE    Comment:        The GeneXpert MRSA Assay (FDA approved for NASAL specimens only), is one component of a comprehensive MRSA colonization surveillance program. It is not intended to diagnose MRSA infection nor to guide or monitor treatment for MRSA infections. Performed at [facility], [address] [place], [place] [address]   Basic metabolic panel     Status: Abnormal   Collection Time: 08/25/17  4:36 AM  Result Value Ref Range   Sodium 136 135 - 145 mmol/L   Potassium 4.0 3.5 - 5.1 mmol/L   Chloride 102 98 - 111 mmol/L    Comment: Please note change in reference range.   CO2 26 22 - 32 mmol/L   Glucose, Bld 112 (H) 70 - 99 mg/dL    Comment: Please note change in reference range.   BUN 26 (H) 8 - 23 mg/dL    Comment: Please note change in reference range.   Creatinine, Ser 1.28 (H) 0.44 - 1.00 mg/dL   Calcium 8.8 (L) 8.9 - 10.3 mg/dL   GFR calc non Af Amer 34 (L) >60 mL/min   GFR calc Af Amer 39 (L) >60 mL/min    Comment: (NOTE) The eGFR has been calculated using the CKD EPI equation. This calculation has not been validated in all clinical situations. eGFR's persistently <60 mL/min signify possible  Chronic Kidney Disease.    Anion gap 8 5 - 15    Comment: Performed at [facility], [address], [place], [place] [address]  CBC     Status: Abnormal   Collection Time: 08/25/17  4:36 AM  Result Value Ref Range   WBC 7.6 4.0 - 10.5 K/uL   RBC 4.25 3.87 - 5.11 MIL/uL   Hemoglobin 10.2 (L) 12.0 - 15.0 g/dL    Comment: DELTA CHECK NOTED   HCT 33.2 (L) 36.0 - 46.0 %   MCV 78.1 78.0 - 100.0 fL   MCH 24.0 (L) 26.0 - 34.0 pg   MCHC 30.7 30.0 - 36.0 g/dL   RDW 17.5 (H) 11.5 - 15.5 %   Platelets 314 150 - 400 K/uL    Comment: Performed at [facility], [address], [place], [place] [address]    ABGS No results for input(s): PHART, PO2ART, TCO2, HCO3 in the last 72 hours.  Invalid input(s): PCO2 CULTURES Recent Results (from the past 240 hour(s))  MRSA PCR Screening     Status: None   Collection Time: 08/24/17  7:51 PM  Result Value Ref Range Status   MRSA by PCR NEGATIVE NEGATIVE Final    Comment:        The GeneXpert MRSA Assay (FDA approved for NASAL specimens only), is one component of a comprehensive MRSA colonization surveillance program. It is not intended to diagnose MRSA infection nor to guide or monitor treatment for MRSA infections. Performed at [facility], [address], [place], [place] [address]    Studies/Results: Dg Chest 1 View  Result Date: 08/24/2017 CLINICAL DATA:  84 year old female with confusion. EXAM: CHEST  1 VIEW COMPARISON:  Chest CT and radiographs 10/27/2016 and earlier. FINDINGS: AP view of the chest at 1501 hours. Stable lung volumes. Stable mild cardiomegaly and mediastinal contours. Calcified aortic atherosclerosis. No pneumothorax, pulmonary edema, pleural effusion or acute pulmonary opacity identified. Negative visible bowel gas pattern. Cholecystectomy clips in the right upper quadrant. No acute osseous abnormality identified. IMPRESSION: No acute cardiopulmonary abnormality. Aortic Atherosclerosis (ICD10-I70.0). Electronically Signed    By: Genevie Ann M.D.   On: 08/24/2017 15:22   Ct Head Wo Contrast  Result Date: 08/24/2017 CLINICAL DATA:  Dizziness today EXAM: CT HEAD WITHOUT CONTRAST TECHNIQUE: Contiguous axial images were obtained from the base of the skull through the vertex without intravenous contrast. COMPARISON:  10/23/2011 FINDINGS: Brain: There is atrophy and chronic small vessel disease changes. No acute intracranial abnormality. Specifically, no hemorrhage, hydrocephalus, mass lesion, acute infarction, or significant intracranial injury. Vascular: No hyperdense vessel or unexpected calcification. Skull: No acute calvarial abnormality. Sinuses/Orbits: Visualized paranasal sinuses and mastoids clear. Orbital soft tissues unremarkable. Other: None IMPRESSION: No acute intracranial abnormality. Atrophy, chronic microvascular disease. Electronically Signed   By: Rolm Baptise M.D.   On: 08/24/2017 13:56   US Venous Img Upper Uni Right  Result Date: 08/24/2017 CLINICAL DATA:  Edema, pain.  History of lower extremity DVT. EXAM: RIGHT UPPER EXTREMITY VENOUS DOPPLER ULTRASOUND TECHNIQUE: Gray-scale sonography with graded compression, as well as color Doppler and duplex ultrasound were performed to evaluate the upper extremity deep venous system from the level of the subclavian vein and including the jugular, axillary, basilic and upper cephalic vein. Spectral Doppler was utilized to evaluate flow at rest and with distal augmentation maneuvers. COMPARISON:  None. FINDINGS: Thrombus within deep veins:  None visualized. Compressibility of deep veins:  Normal. Duplex waveform respiratory phasicity:  Normal. Duplex waveform response to augmentation:  Normal. Venous reflux:  None visualized. Other findings: There is segmental occlusive thrombus in the cephalic vein near the elbow. Cephalic vein is patent and compressible peripherally in the forearm and more centrally. Limited images of the contralateral subclavian vein unremarkable. IMPRESSION: 1.  Negative for right upper extremity DVT. 2. Segmental superficial thrombophlebitis in the cephalic vein at the level of the elbow. Electronically Signed   By: Lucrezia Europe M.D.   On: 08/24/2017 15:34   Dg Hand Complete Right  Result Date: 08/24/2017 CLINICAL DATA:  84 year old female with pain and swelling in the hand with no known injury. EXAM: RIGHT HAND - COMPLETE 3+ VIEW COMPARISON:  [facility] right hand radiographs 08/15/2017. FINDINGS: Bone mineralization is stable and normal for age. Distal radius and ulna are stable and intact. Carpal bone alignment is stable and within normal limits. Metacarpals and phalanges are intact. Mild for age joint degeneration. No acute osseous abnormality identified. Dorsal soft tissue swelling at the level of the metacarpals has regressed compared to  08/15/2017. IMPRESSION: No acute osseous abnormality in the right hand. Electronically Signed   By: Genevie Ann M.D.   On: 08/24/2017 15:23    Medications:  Prior to Admission:  Medications Prior to Admission  Medication Sig Dispense Refill Last Dose  . acetaminophen (TYLENOL) 325 MG tablet Take 325 mg by mouth every 6 (six) hours as needed.   Taking  . albuterol (PROVENTIL HFA;VENTOLIN HFA) 108 (90 Base) MCG/ACT inhaler Inhale 2 puffs into the lungs every 6 (six) hours as needed for wheezing or shortness of breath. 1 Inhaler 2 Taking  . apixaban (ELIQUIS) 2.5 MG TABS tablet Take 5 mg by mouth 2 (two) times daily.    08/24/2017 at 1000  . carboxymethylcellulose (REFRESH PLUS) 0.5 % SOLN Place 1 drop into both eyes 4 (four) times daily.   08/24/2017 at 1000  . Dextromethorphan-guaiFENesin (ROBITUSSIN DM PO) Take by mouth as needed.   Taking  . furosemide (LASIX) 40 MG tablet Take 1 tablet (40 mg total) by mouth 2 (two) times daily. 180 tablet 3 08/24/2017 at 1000  . furosemide (LASIX) 40 MG tablet Take 40 mg by mouth daily as needed.    Taking  . gabapentin (NEURONTIN) 300 MG capsule Take 600 mg by mouth at bedtime.    08/23/2017 at 2000  . ketoconazole (NIZORAL) 2 % shampoo Apply 1 application topically once a week. As needed for antifungal.   Taking  . ketorolac (TORADOL) 10 MG tablet Take 10 mg by mouth every 6 (six) hours as needed.   08/22/2017 at 0929  . levothyroxine (SYNTHROID, LEVOTHROID) 88 MCG tablet Take 88 mcg by mouth daily before breakfast.   08/24/2017 at 0600  . LINZESS 290 MCG CAPS capsule Take 290 mcg by mouth daily before breakfast.    08/24/2017 at 0600  . neomycin-polymyxin-dexameth (MAXITROL) 0.1 % OINT Place 1 application into both eyes 3 (three) times daily.   08/24/2017 at 1000  . NIFEdipine (PROCARDIA-XL/ADALAT CC) 30 MG 24 hr tablet Take 30 mg by mouth daily.   08/24/2017 at 1000  . omeprazole (PRILOSEC) 20 MG capsule Take 20 mg by mouth 2 (two) times daily before a meal.    08/24/2017 at 1000  . oxybutynin (DITROPAN) 5 MG tablet Take 5 mg by mouth daily.    08/24/2017 at 1000  . polyethylene glycol powder (GLYCOLAX/MIRALAX) powder Take 17 g by mouth daily.    08/24/2017 at 0800  . pravastatin (PRAVACHOL) 40 MG tablet Take 40 mg by mouth at bedtime.    08/23/2017 at 2000  . risperiDONE (RISPERDAL) 0.25 MG tablet Take 0.25 mg by mouth daily.   08/24/2017 at 1000  . risperiDONE (RISPERDAL) 0.5 MG tablet Take 0.5 mg by mouth at bedtime.    08/23/2017 at 2000  . rOPINIRole (REQUIP) 0.5 MG tablet Take 0.5 mg by mouth at bedtime.   08/23/2017 at 2000  . simethicone (MYLICON) 80 MG chewable tablet Chew 80 mg by mouth every 8 (eight) hours as needed for flatulence.   Taking  . traZODone (DESYREL) 50 MG tablet Take 25 mg by mouth at bedtime.   08/23/2017 at 2000  . trolamine salicylate (ASPERCREME) 10 % cream Apply 1 application topically as needed for muscle pain.   08/22/2017 at 1105  . umeclidinium-vilanterol (ANORO ELLIPTA) 62.5-25 MCG/INH AEPB Inhale 1 puff into the lungs daily.   08/24/2017 at 1000   Scheduled: . furosemide  40 mg Oral BID  . gabapentin  600 mg Oral QHS  . levothyroxine  88 mcg  Oral QAC breakfast   . mouth rinse  15 mL Mouth Rinse BID  . NIFEdipine  30 mg Oral Daily  . oxybutynin  5 mg Oral Daily  . pravastatin  40 mg Oral QHS  . risperiDONE  0.25 mg Oral Daily  . risperiDONE  0.5 mg Oral QHS  . rOPINIRole  0.5 mg Oral QHS  . sodium chloride flush  3 mL Intravenous Q12H  . traZODone  25 mg Oral QHS   Continuous: . sodium chloride Stopped (08/25/17 0143)   UGQ:BVQXIH chloride, albuterol, guaiFENesin-dextromethorphan, sodium chloride flush  Assesment: She has presumed GI bleeding.  She has symptomatic anemia and has been transfused.  She has chronic diastolic heart failure which does not show any symptoms right now.  She has been confused and agitated but that is better right now.  She is known to have severe anxiety/schizoaffective disorder stable Principal Problem:   GIB (gastrointestinal bleeding) Active Problems:   Microcytic anemia   Anxiety   Chronic diastolic CHF (congestive heart failure) (HCC)   CKD (chronic kidney disease)   Symptomatic anemia   GI bleed    Plan: Continue current treatments.    LOS: 0 days   Leeta Grimme L 08/25/2017, 9:47 AM

## 2017-08-26 ENCOUNTER — Encounter (HOSPITAL_COMMUNITY): Payer: Self-pay | Admitting: Gastroenterology

## 2017-08-26 DIAGNOSIS — D500 Iron deficiency anemia secondary to blood loss (chronic): Secondary | ICD-10-CM | POA: Diagnosis present

## 2017-08-26 LAB — CBC WITH DIFFERENTIAL/PLATELET
Basophils Absolute: 0 10*3/uL (ref 0.0–0.1)
Basophils Relative: 0 %
EOS PCT: 2 %
Eosinophils Absolute: 0.2 10*3/uL (ref 0.0–0.7)
HEMATOCRIT: 32 % — AB (ref 36.0–46.0)
Hemoglobin: 9.6 g/dL — ABNORMAL LOW (ref 12.0–15.0)
LYMPHS PCT: 22 %
Lymphs Abs: 1.7 10*3/uL (ref 0.7–4.0)
MCH: 23.7 pg — ABNORMAL LOW (ref 26.0–34.0)
MCHC: 30 g/dL (ref 30.0–36.0)
MCV: 79 fL (ref 78.0–100.0)
MONO ABS: 0.9 10*3/uL (ref 0.1–1.0)
MONOS PCT: 11 %
NEUTROS ABS: 5 10*3/uL (ref 1.7–7.7)
Neutrophils Relative %: 65 %
PLATELETS: 338 10*3/uL (ref 150–400)
RBC: 4.05 MIL/uL (ref 3.87–5.11)
RDW: 17.7 % — AB (ref 11.5–15.5)
WBC: 7.7 10*3/uL (ref 4.0–10.5)

## 2017-08-26 MED ORDER — PANTOPRAZOLE SODIUM 40 MG PO TBEC
40.0000 mg | DELAYED_RELEASE_TABLET | Freq: Every day | ORAL | Status: DC
  Administered 2017-08-26 – 2017-08-29 (×4): 40 mg via ORAL
  Filled 2017-08-26 (×5): qty 1

## 2017-08-26 NOTE — Consult Note (Addendum)
Referring Provider: Dr. Shanon Brow  Primary Care Physician:  Sinda Du, MD Primary Gastroenterologist:  Dr. Gala Romney   Date of Admission: 08/24/17 Date of Consultation: 08/26/17  Reason for Consultation:  Anemia   HPI:  Christine Hancock is a pleasant 84 y.o. year old female with a history of anemia, last seen by [facility] in 2013 during hospitalization for IDA. Colonoscopy was attempted in 2013 but unable to complete due to poor prep. EGD with noncritical Schatzki's ring, scattered erosions, normal duodenum. Path with moderate chronic atrophic gastritis. ACBE completed: No obvious mass, extensive colonic diverticulosis noted. Admitting Hgb 7.4, IDA noted on labs with ferritin 9. Heme positive without overt GI bleeding. Received 2 units PRBCs. She was on Eliquis with history of recurrent DVTs. Eliquis on hold as of admission.  She has had no overt GI bleeding. No abdominal pain, N/V. Takes Miralax and Linzess for constipation. No reflux symptoms. No dysphagia. Good appetite. Ate all her breakfast this morning. Believes she may have had a colonoscopy many years ago (distant past). Pleasantly confused, believes she is in [place] but easily reoriented. She does not remember exactly why she was brought to the hospital. Denies chest pain, shortness of breath currently. Feels improved from admission after blood transfusion. She wonders if she can go home.      Past Medical History:  Diagnosis Date  . Anxiety   . DVT, lower extremity, recurrent ([place])   . GERD (gastroesophageal reflux disease)   . Hypercholesterolemia   . Hypotension   . Hypothyroidism   . Phlebitis     Past Surgical History:  Procedure Laterality Date  . ABDOMINAL HYSTERECTOMY     partial  . CATARACT EXTRACTION W/PHACO Right 06/28/2013   Procedure: CATARACT EXTRACTION PHACO AND INTRAOCULAR LENS PLACEMENT (IOC);  Surgeon: Tonny Branch, MD;  Location: [facility];  Service: Ophthalmology;  Laterality: Right;  CDE:13.79  . CATARACT EXTRACTION W/PHACO  Left 07/26/2013   Procedure: CATARACT EXTRACTION PHACO AND INTRAOCULAR LENS PLACEMENT LEFT EYE;  Surgeon: Tonny Branch, MD;  Location: [facility];  Service: Ophthalmology;  Laterality: Left;  CDE: 11.76  . CHOLECYSTECTOMY    . ESOPHAGEAL DILATION    . ESOPHAGOGASTRODUODENOSCOPY  2013   Dr. Gala Romney: noncritical Schatzki's ring, scattered erosions, normal duodenum, path with moderate chronic atrophic gastritis  . TONSILLECTOMY      Prior to Admission medications   Medication Sig Start Date End Date Taking? Authorizing Provider  acetaminophen (TYLENOL) 325 MG tablet Take 325 mg by mouth every 6 (six) hours as needed.   Yes [provider]  albuterol (PROVENTIL HFA;VENTOLIN HFA) 108 (90 Base) MCG/ACT inhaler Inhale 2 puffs into the lungs every 6 (six) hours as needed for wheezing or shortness of breath. 10/28/16  Yes Sinda Du, MD  apixaban (ELIQUIS) 2.5 MG TABS tablet Take 5 mg by mouth 2 (two) times daily.    Yes [provider]  carboxymethylcellulose (REFRESH PLUS) 0.5 % SOLN Place 1 drop into both eyes 4 (four) times daily.   Yes [provider]  Dextromethorphan-guaiFENesin (ROBITUSSIN DM PO) Take by mouth as needed.   Yes [provider]  furosemide (LASIX) 40 MG tablet Take 1 tablet (40 mg total) by mouth 2 (two) times daily. 04/18/17 07/21/18 Yes BranchAlphonse Guild, MD  furosemide (LASIX) 40 MG tablet Take 40 mg by mouth daily as needed.    Yes [provider]  gabapentin (NEURONTIN) 300 MG capsule Take 600 mg by mouth at bedtime.   Yes [provider]  ketoconazole (NIZORAL) 2 % shampoo Apply 1 application topically once a week. As needed for antifungal.   Yes [provider]  ketorolac (TORADOL) 10 MG tablet Take 10 mg by mouth every 6 (six) hours as needed.   Yes [provider]  levothyroxine (SYNTHROID, LEVOTHROID) 88 MCG tablet Take 88 mcg by mouth daily before breakfast.   Yes [provider]  LINZESS 290 MCG  CAPS capsule Take 290 mcg by mouth daily before breakfast.  09/07/16  Yes [provider]  neomycin-polymyxin-dexameth (MAXITROL) 0.1 % OINT Place 1 application into both eyes 3 (three) times daily.   Yes [provider]  NIFEdipine (PROCARDIA-XL/ADALAT CC) 30 MG 24 hr tablet Take 30 mg by mouth daily.   Yes [provider]  omeprazole (PRILOSEC) 20 MG capsule Take 20 mg by mouth 2 (two) times daily before a meal.    Yes [provider]  oxybutynin (DITROPAN) 5 MG tablet Take 5 mg by mouth daily.  09/07/16  Yes [provider]  polyethylene glycol powder (GLYCOLAX/MIRALAX) powder Take 17 g by mouth daily.  09/20/16  Yes [provider]  pravastatin (PRAVACHOL) 40 MG tablet Take 40 mg by mouth at bedtime.  09/07/16  Yes [provider]  risperiDONE (RISPERDAL) 0.25 MG tablet Take 0.25 mg by mouth daily.   Yes [provider]  risperiDONE (RISPERDAL) 0.5 MG tablet Take 0.5 mg by mouth at bedtime.    Yes [provider]  rOPINIRole (REQUIP) 0.5 MG tablet Take 0.5 mg by mouth at bedtime.   Yes [provider]  simethicone (MYLICON) 80 MG chewable tablet Chew 80 mg by mouth every 8 (eight) hours as needed for flatulence.   Yes [provider]  traZODone (DESYREL) 50 MG tablet Take 25 mg by mouth at bedtime.   Yes [provider]  trolamine salicylate (ASPERCREME) 10 % cream Apply 1 application topically as needed for muscle pain.   Yes [provider]  umeclidinium-vilanterol (ANORO ELLIPTA) 62.5-25 MCG/INH AEPB Inhale 1 puff into the lungs daily.   Yes [provider]    Current Facility-Administered Medications  Medication Dose Route Frequency Provider Last Rate Last Dose  . 0.9 %  sodium chloride infusion  250 mL Intravenous PRN Phillips Grout, MD   Stopped at 08/25/17 0143  . acetaminophen (TYLENOL) tablet 650 mg  650 mg Oral Q6H PRN Jani Gravel, MD   650 mg at 08/26/17 0022  .  albuterol (PROVENTIL) (2.5 MG/3ML) 0.083% nebulizer solution 2.5 mg  2.5 mg Nebulization Q6H PRN Derrill Kay A, MD      . furosemide (LASIX) tablet 40 mg  40 mg Oral BID Phillips Grout, MD   40 mg at 08/25/17 1835  . gabapentin (NEURONTIN) capsule 600 mg  600 mg Oral QHS Derrill Kay A, MD   600 mg at 08/25/17 2323  . guaiFENesin-dextromethorphan (ROBITUSSIN DM) 100-10 MG/5ML syrup 5 mL  5 mL Oral Q4H PRN Phillips Grout, MD   5 mL at 08/24/17 2049  . levothyroxine (SYNTHROID, LEVOTHROID) tablet 88 mcg  88 mcg Oral QAC breakfast Phillips Grout, MD   88 mcg at 08/25/17 1053  . MEDLINE mouth rinse  15 mL Mouth Rinse BID Derrill Kay A, MD   15 mL at 08/25/17 2324  . NIFEdipine (PROCARDIA-XL/ADALAT-CC/NIFEDICAL-XL) 24 hr tablet 30 mg  30 mg Oral Daily Derrill Kay A, MD   30 mg at 08/25/17 1053  . oxybutynin (DITROPAN) tablet 5 mg  5  mg Oral Daily Derrill Kay A, MD   5 mg at 08/25/17 1054  . pravastatin (PRAVACHOL) tablet 40 mg  40 mg Oral QHS Derrill Kay A, MD   40 mg at 08/25/17 2322  . risperiDONE (RISPERDAL) tablet 0.25 mg  0.25 mg Oral Daily Derrill Kay A, MD   0.25 mg at 08/25/17 1053  . risperiDONE (RISPERDAL) tablet 0.5 mg  0.5 mg Oral QHS Derrill Kay A, MD   0.5 mg at 08/25/17 2323  . rOPINIRole (REQUIP) tablet 0.5 mg  0.5 mg Oral QHS Derrill Kay A, MD   0.5 mg at 08/25/17 2323  . sodium chloride flush (NS) 0.9 % injection 3 mL  3 mL Intravenous Q12H Derrill Kay A, MD   3 mL at 08/25/17 2324  . sodium chloride flush (NS) 0.9 % injection 3 mL  3 mL Intravenous PRN Phillips Grout, MD      . traZODone (DESYREL) tablet 25 mg  25 mg Oral QHS Derrill Kay A, MD   25 mg at 08/25/17 2323    Allergies as of 08/24/2017  . (No Known Allergies)    Family History  Problem Relation Age of Onset  . Diabetes Mother   . Diabetes Brother   . Anemia Brother   . Anemia Sister   . Coronary artery disease Other     Social History   Socioeconomic History  . Marital status:  Widowed    Spouse name: Not on file  . Number of children: 0  . Years of education: Not on file  . Highest education level: Not on file  Occupational History  . Occupation: retired Chartered certified accountant: RETIRED  Social Needs  . Financial resource strain: Not on file  . Food insecurity:    Worry: Not on file    Inability: Not on file  . Transportation needs:    Medical: Not on file    Non-medical: Not on file  Tobacco Use  . Smoking status: Former Smoker    Packs/day: 0.25    Years: 15.00    Pack years: 3.75    Types: Cigarettes    Last attempt to quit: 10/22/2005    Years since quitting: 11.8  . Smokeless tobacco: Never Used  Substance and Sexual Activity  . Alcohol use: No  . Drug use: No  . Sexual activity: Never  Lifestyle  . Physical activity:    Days per week: Not on file    Minutes per session: Not on file  . Stress: Not on file  Relationships  . Social connections:    Talks on phone: Not on file    Gets together: Not on file    Attends religious service: Not on file    Active member of club or organization: Not on file    Attends meetings of clubs or organizations: Not on file    Relationship status: Not on file  . Intimate partner violence:    Fear of current or ex partner: Not on file    Emotionally abused: Not on file    Physically abused: Not on file    Forced sexual activity: Not on file  Other Topics Concern  . Not on file  Social History Narrative   Moved from [place] 27 yrs ago.  Lives w/ brother.    Review of Systems: Gen: Denies fever, chills, loss of appetite, change in weight or weight loss CV: Denies chest pain, heart palpitations, syncope, edema  Resp: Denies shortness of breath  with rest, cough, wheezing GI: see HP I GU : Denies urinary burning, urinary frequency, urinary incontinence.  MS: Denies joint pain,swelling, cramping Derm: Denies rash, itching, dry skin Psych: Denies depression, anxiety,confusion, or memory loss Heme:  Denies bruising, bleeding, and enlarged lymph nodes.  Physical Exam: Vital signs in last 24 hours: Temp:  [97.9 F (36.6 C)-98.5 F (36.9 C)] 98.5 F (36.9 C) (07/05 0607) Pulse Rate:  [88-96] 96 (07/05 0607) Resp:  [18] 18 (07/04 1400) BP: (124-129)/(65-79) 126/75 (07/05 0607) SpO2:  [95 %-100 %] 95 % (07/05 0607) Last BM Date: 08/24/17 General:   Alert, appears younger than stated age, no distress, oriented to person and year Head:  Normocephalic and atraumatic. Eyes:  Sclera clear, no icterus.   Conjunctiva pink. Ears:  Normal auditory acuity. Nose:  No deformity, discharge,  or lesions. Mouth:  No deformity or lesions Lungs:  Clear throughout to auscultation.   Heart:  S1 S2 present without murmurs  Abdomen:  Soft, nontender and nondistended. No masses, hepatosplenomegaly or hernias noted. Normal bowel sounds, without guarding, and without rebound.   Rectal:  Deferred  Msk:  Symmetrical without gross deformities. Normal posture. Extremities:  Without  edema. Neurologic:  Alert and  oriented x 4 Psych:  Alert and cooperative. Normal mood and affect.  Intake/Output from previous day: 07/04 0701 - 07/05 0700 In: 700 [P.O.:700] Out: 2400 [Urine:2400] Intake/Output this shift: No intake/output data recorded.  Lab Results: Recent Labs    08/24/17 1326 08/25/17 0436 08/26/17 0555  WBC 7.3 7.6 7.7  HGB 7.4* 10.2* 9.6*  HCT 25.3* 33.2* 32.0*  PLT 333 314 338   Lab Results  Component Value Date   IRON 15 (L) 08/24/2017   TIBC 447 08/24/2017   FERRITIN 9 (L) 08/24/2017    BMET Recent Labs    08/24/17 1326 08/25/17 0436  NA 132* 136  K 4.2 4.0  CL 100 102  CO2 23 26  GLUCOSE 100* 112*  BUN 32* 26*  CREATININE 1.41* 1.28*  CALCIUM 8.7* 8.8*   LFT Recent Labs    08/24/17 1326  PROT 8.1  ALBUMIN 3.5  AST 26  ALT 18  ALKPHOS 147*  BILITOT 0.4    Studies/Results: Dg Chest 1 View  Result Date: 08/24/2017 CLINICAL DATA:  84 year old female with  confusion. EXAM: CHEST  1 VIEW COMPARISON:  Chest CT and radiographs 10/27/2016 and earlier. FINDINGS: AP view of the chest at 1501 hours. Stable lung volumes. Stable mild cardiomegaly and mediastinal contours. Calcified aortic atherosclerosis. No pneumothorax, pulmonary edema, pleural effusion or acute pulmonary opacity identified. Negative visible bowel gas pattern. Cholecystectomy clips in the right upper quadrant. No acute osseous abnormality identified. IMPRESSION: No acute cardiopulmonary abnormality. Aortic Atherosclerosis (ICD10-I70.0). Electronically Signed   By: Genevie Ann M.D.   On: 08/24/2017 15:22   Ct Head Wo Contrast  Result Date: 08/24/2017 CLINICAL DATA:  Dizziness today EXAM: CT HEAD WITHOUT CONTRAST TECHNIQUE: Contiguous axial images were obtained from the base of the skull through the vertex without intravenous contrast. COMPARISON:  10/23/2011 FINDINGS: Brain: There is atrophy and chronic small vessel disease changes. No acute intracranial abnormality. Specifically, no hemorrhage, hydrocephalus, mass lesion, acute infarction, or significant intracranial injury. Vascular: No hyperdense vessel or unexpected calcification. Skull: No acute calvarial abnormality. Sinuses/Orbits: Visualized paranasal sinuses and mastoids clear. Orbital soft tissues unremarkable. Other: None IMPRESSION: No acute intracranial abnormality. Atrophy, chronic microvascular disease. Electronically Signed   By: Rolm Baptise M.D.   On: 08/24/2017 13:56  US Venous Img Upper Uni Right  Result Date: 08/24/2017 CLINICAL DATA:  Edema, pain.  History of lower extremity DVT. EXAM: RIGHT UPPER EXTREMITY VENOUS DOPPLER ULTRASOUND TECHNIQUE: Gray-scale sonography with graded compression, as well as color Doppler and duplex ultrasound were performed to evaluate the upper extremity deep venous system from the level of the subclavian vein and including the jugular, axillary, basilic and upper cephalic vein. Spectral Doppler was  utilized to evaluate flow at rest and with distal augmentation maneuvers. COMPARISON:  None. FINDINGS: Thrombus within deep veins:  None visualized. Compressibility of deep veins:  Normal. Duplex waveform respiratory phasicity:  Normal. Duplex waveform response to augmentation:  Normal. Venous reflux:  None visualized. Other findings: There is segmental occlusive thrombus in the cephalic vein near the elbow. Cephalic vein is patent and compressible peripherally in the forearm and more centrally. Limited images of the contralateral subclavian vein unremarkable. IMPRESSION: 1. Negative for right upper extremity DVT. 2. Segmental superficial thrombophlebitis in the cephalic vein at the level of the elbow. Electronically Signed   By: Lucrezia Europe M.D.   On: 08/24/2017 15:34   Dg Hand Complete Right  Result Date: 08/24/2017 CLINICAL DATA:  84 year old female with pain and swelling in the hand with no known injury. EXAM: RIGHT HAND - COMPLETE 3+ VIEW COMPARISON:  [facility] right hand radiographs 08/15/2017. FINDINGS: Bone mineralization is stable and normal for age. Distal radius and ulna are stable and intact. Carpal bone alignment is stable and within normal limits. Metacarpals and phalanges are intact. Mild for age joint degeneration. No acute osseous abnormality identified. Dorsal soft tissue swelling at the level of the metacarpals has regressed compared to 08/15/2017. IMPRESSION: No acute osseous abnormality in the right hand. Electronically Signed   By: Genevie Ann M.D.   On: 08/24/2017 15:23    Impression: Very pleasant 84 year old female presenting with history of IDA in remote past, now with acute on chronic symptomatic anemia with Hgb 7.4 in setting of chronic Eliquis for recurrent DVT (initial in 2011 and most recent May 2018 at [facility] per notes in epic). Received 2 units with improvement in Hgb and feels overall improved from admission. Although heme positive, she has not had any overt  GI bleeding and denies any concerning upper or lower GI symptoms. No recent colonoscopy on file, and last EGD in 2013 with chronic atrophic gastritis.   Anemia consistent with IDA. I am unsure her baseline, but last Hgb on file was 10 in September 2018. She is uncertain if she wants to pursue any endoscopic evaluations at this time. As of note, colonoscopy was attempted in 2013 when admitted with anemia, but poor prep precluded exam. ACBE was negative.   In setting of Eliquis, could have occult blood loss anywhere in GI tract. As she does not want to pursue endoscopic evaluation currently, recommend serial monitoring of CBC and placing on oral iron as outpatient if tolerated. If she does desire further evaluation, could pursue EGD +/- capsule to evaluate for occult small bowel source. Anticoagulation remains on hold at time of admission; interesting scenario as she has a history of recurrent DVTs and risks/benefits of continuing versus holding will need to be assessed in light of acute on chronic anemia.     Plan: Follow clinically for now: patient is unsure she would like to pursue endoscopic evaluation Add Protonix po daily  Recommend iron supplementation as outpatient (oral vs parenteral if oral unable to be tolerated) Serial monitoring of CBC Eliquis remains on  hold Could consider EGD +/- capsule if patient desires further evaluation    Annitta Needs, PhD, ANP-BC [facility]     LOS: 1 day    08/26/2017, 9:25 AM

## 2017-08-26 NOTE — Clinical Social Work Note (Signed)
Clinical Social Work Assessment  Patient Details  Name: Christine Hancock MRN: [id number] Date of Birth: 04/16/1917  Date of referral:  08/26/17               Reason for consult:  Discharge Planning                Permission sought to share information with:    Permission granted to share information::     Name::        Agency::     Relationship::     Contact Information:     Housing/Transportation Living arrangements for the past 2 months:  [place] of Information:  Facility Patient Interpreter Needed:  None Criminal Activity/Legal Involvement Pertinent to Current Situation/Hospitalization:  No - Comment as needed Significant Relationships:  Adult Children Lives with:  Facility Resident Do you feel safe going back to the place where you live?  Yes Need for family participation in patient care:  No (Coment)  Care giving concerns:  Pt resides at [facility] in [place].   Social Worker assessment / plan: Pt is a 84 year old female admitted from [place] in [place]. Pt will return there at dc unless she needs short term SNF rehab. Therapies have not been consulted at this time. Spoke with Development worker, international aid, Brynda Peon [phone]) to update today. Per Cherie, pt receives assistance with all her ADL's. She ambulates with a walker. They can arrange PT with [facility] if they receive orders for such at dc. Per Cherie, if pt is stable for dc over the weekend, she may be discharged back. Family will need to transport and they are aware. Family will need to be instructed to pick up a portable O2 tank from the ALF to bring with for pt as she uses O2 chronically there. DC clinical can be faxed to [phone]. Weekend CSW will be available to assist.  Employment status:  Retired Forensic scientist:  Medicare PT Recommendations:  Not assessed at this time Information / Referral to community resources:     Patient/Family's Response to care: Pt and family accepting of  care.  Patient/Family's Understanding of and Emotional Response to Diagnosis, Current Treatment, and Prognosis: Pt and family appear to have a good understanding of diagnosis and treatment recommendations. No emotional distress identified.  Emotional Assessment Appearance:  Appears stated age Attitude/Demeanor/Rapport:  Engaged Affect (typically observed):  Calm, Pleasant Orientation:  Oriented to Self, Oriented to Place, Oriented to Situation Alcohol / Substance use:  Not Applicable Psych involvement (Current and /or in the community):  No (Comment)  Discharge Needs  Concerns to be addressed:  Discharge Planning Concerns Readmission within the last 30 days:  No Current discharge risk:  None Barriers to Discharge:  No Barriers Identified   Shade Flood, LCSW 08/26/2017, 1:51 PM

## 2017-08-26 NOTE — Progress Notes (Signed)
Subjective: She was admitted with symptomatic anemia.  She is received 2 units of blood and is doing better.  No other new complaints.  Objective: Vital signs in last 24 hours: Temp:  [97.9 F (36.6 C)-98.5 F (36.9 C)] 98.5 F (36.9 C) (07/05 0607) Pulse Rate:  [88-96] 96 (07/05 0607) Resp:  [18] 18 (07/04 1400) BP: (124-129)/(65-79) 126/75 (07/05 0607) SpO2:  [95 %-100 %] 95 % (07/05 0607) Weight change:  Last BM Date: 08/24/17  Intake/Output from previous day: 07/04 0701 - 07/05 0700 In: 700 [P.O.:700] Out: 2400 [Urine:2400]  PHYSICAL EXAM General appearance: alert, cooperative and no distress Resp: clear to auscultation bilaterally Cardio: regular rate and rhythm, S1, S2 normal, no murmur, click, rub or gallop GI: soft, non-tender; bowel sounds normal; no masses,  no organomegaly Extremities: Chronic edema and venous stasis  Lab Results:  Results for orders placed or performed during the hospital encounter of 08/24/17 (from the past 48 hour(s))  CBC with Differential     Status: Abnormal   Collection Time: 08/24/17  1:26 PM  Result Value Ref Range   WBC 7.3 4.0 - 10.5 K/uL   RBC 3.36 (L) 3.87 - 5.11 MIL/uL   Hemoglobin 7.4 (L) 12.0 - 15.0 g/dL   HCT 25.3 (L) 36.0 - 46.0 %   MCV 75.3 (L) 78.0 - 100.0 fL   MCH 22.0 (L) 26.0 - 34.0 pg   MCHC 29.2 (L) 30.0 - 36.0 g/dL   RDW 17.7 (H) 11.5 - 15.5 %   Platelets 333 150 - 400 K/uL   Neutrophils Relative % 60 %   Neutro Abs 4.4 1.7 - 7.7 K/uL   Lymphocytes Relative 27 %   Lymphs Abs 2.0 0.7 - 4.0 K/uL   Monocytes Relative 11 %   Monocytes Absolute 0.8 0.1 - 1.0 K/uL   Eosinophils Relative 2 %   Eosinophils Absolute 0.1 0.0 - 0.7 K/uL   Basophils Relative 0 %   Basophils Absolute 0.0 0.0 - 0.1 K/uL    Comment: Performed at [facility], [address] [place], [place] [address]  Comprehensive metabolic panel     Status: Abnormal   Collection Time: 08/24/17  1:26 PM  Result Value Ref Range   Sodium 132 (L) 135 - 145  mmol/L   Potassium 4.2 3.5 - 5.1 mmol/L   Chloride 100 98 - 111 mmol/L    Comment: Please note change in reference range.   CO2 23 22 - 32 mmol/L   Glucose, Bld 100 (H) 70 - 99 mg/dL    Comment: Please note change in reference range.   BUN 32 (H) 8 - 23 mg/dL    Comment: Please note change in reference range.   Creatinine, Ser 1.41 (H) 0.44 - 1.00 mg/dL   Calcium 8.7 (L) 8.9 - 10.3 mg/dL   Total Protein 8.1 6.5 - 8.1 g/dL   Albumin 3.5 3.5 - 5.0 g/dL   AST 26 15 - 41 U/L   ALT 18 0 - 44 U/L    Comment: Please note change in reference range.   Alkaline Phosphatase 147 (H) 38 - 126 U/L   Total Bilirubin 0.4 0.3 - 1.2 mg/dL   GFR calc non Af Amer 30 (L) >60 mL/min   GFR calc Af Amer 35 (L) >60 mL/min    Comment: (NOTE) The eGFR has been calculated using the CKD EPI equation. This calculation has not been validated in all clinical situations. eGFR's persistently <60 mL/min signify possible Chronic Kidney Disease.  Anion gap 9 5 - 15    Comment: Performed at [facility], [address], [place], [place] [address]  Troponin I     Status: Abnormal   Collection Time: 08/24/17  1:26 PM  Result Value Ref Range   Troponin I 0.05 (HH) <0.03 ng/mL    Comment: CRITICAL RESULT CALLED TO, READ BACK BY AND VERIFIED WITH: BDANIELS 08/24/17 @ 1435 [id number] Performed at [facility], [address] [place], [place] [address]   CBG monitoring, ED     Status: Abnormal   Collection Time: 08/24/17  2:14 PM  Result Value Ref Range   Glucose-Capillary 105 (H) 70 - 99 mg/dL  ABO/Rh     Status: None   Collection Time: 08/24/17  2:32 PM  Result Value Ref Range   ABO/RH(D)      A POS Performed at [facility], [address], [place], [place] [address]   Type and screen [facility]     Status: None   Collection Time: 08/24/17  2:48 PM  Result Value Ref Range   ABO/RH(D) A POS    Antibody Screen NEG    Sample Expiration 08/27/2017    Unit Number [id number]    Blood Component Type RED  CELLS,LR    Unit division 00    Status of Unit ISSUED,FINAL    Transfusion Status OK TO TRANSFUSE    Crossmatch Result Compatible    Unit Number [id number]    Blood Component Type RED CELLS,LR    Unit division 00    Status of Unit ISSUED,FINAL    Transfusion Status OK TO TRANSFUSE    Crossmatch Result      Compatible Performed at [facility], [address] [place], [place] [address]   Prepare RBC     Status: None   Collection Time: 08/24/17  2:48 PM  Result Value Ref Range   Order Confirmation      ORDER PROCESSED BY BLOOD BANK Performed at [facility], [address] [place], [place] [address]   POC occult blood, ED Provider will collect     Status: Abnormal   Collection Time: 08/24/17  3:21 PM  Result Value Ref Range   Fecal Occult Bld POSITIVE (A) NEGATIVE  Vitamin B12     Status: None   Collection Time: 08/24/17  5:12 PM  Result Value Ref Range   Vitamin B-12 493 180 - 914 pg/mL    Comment: (NOTE) This assay is not validated for testing neonatal or myeloproliferative syndrome specimens for Vitamin B12 levels. Performed at [facility], [place] [address] [place], [place] [address]   Folate     Status: None   Collection Time: 08/24/17  5:12 PM  Result Value Ref Range   Folate 32.0 >5.9 ng/mL    Comment: Performed at [place] [address] [place], [place] [address]  Iron and TIBC     Status: Abnormal   Collection Time: 08/24/17  5:12 PM  Result Value Ref Range   Iron 15 (L) 28 - 170 ug/dL   TIBC 447 250 - 450 ug/dL   Saturation Ratios 3 (L) 10.4 - 31.8 %   UIBC 432 ug/dL    Comment: Performed at [facility], [place] [address] [place], [place] [address]  Ferritin     Status: Abnormal   Collection Time: 08/24/17  5:12 PM  Result Value Ref Range   Ferritin 9 (L) 11 - 307 ng/mL    Comment: Performed  at [facility], [place] [address] [place], [place] [address]  Reticulocytes     Status: Abnormal   Collection Time: 08/24/17  5:12 PM   Result Value Ref Range   Retic Ct Pct 1.8 0.4 - 3.1 %   RBC. 3.35 (L) 3.87 - 5.11 MIL/uL   Retic Count, Absolute 60.3 19.0 - 186.0 K/uL    Comment: Performed at [facility], [address] [place], [place] [address]  MRSA PCR Screening     Status: None   Collection Time: 08/24/17  7:51 PM  Result Value Ref Range   MRSA by PCR NEGATIVE NEGATIVE    Comment:        The GeneXpert MRSA Assay (FDA approved for NASAL specimens only), is one component of a comprehensive MRSA colonization surveillance program. It is not intended to diagnose MRSA infection nor to guide or monitor treatment for MRSA infections. Performed at [facility], [address] [place], [place] [address]   Basic metabolic panel     Status: Abnormal   Collection Time: 08/25/17  4:36 AM  Result Value Ref Range   Sodium 136 135 - 145 mmol/L   Potassium 4.0 3.5 - 5.1 mmol/L   Chloride 102 98 - 111 mmol/L    Comment: Please note change in reference range.   CO2 26 22 - 32 mmol/L   Glucose, Bld 112 (H) 70 - 99 mg/dL    Comment: Please note change in reference range.   BUN 26 (H) 8 - 23 mg/dL    Comment: Please note change in reference range.   Creatinine, Ser 1.28 (H) 0.44 - 1.00 mg/dL   Calcium 8.8 (L) 8.9 - 10.3 mg/dL   GFR calc non Af Amer 34 (L) >60 mL/min   GFR calc Af Amer 39 (L) >60 mL/min    Comment: (NOTE) The eGFR has been calculated using the CKD EPI equation. This calculation has not been validated in all clinical situations. eGFR's persistently <60 mL/min signify possible Chronic Kidney Disease.    Anion gap 8 5 - 15    Comment: Performed at [facility], [address], [place], [place] [address]  CBC     Status: Abnormal   Collection Time: 08/25/17  4:36 AM  Result Value Ref Range   WBC 7.6 4.0 - 10.5 K/uL   RBC 4.25 3.87 - 5.11 MIL/uL   Hemoglobin 10.2 (L) 12.0 - 15.0 g/dL    Comment: DELTA CHECK NOTED   HCT 33.2 (L) 36.0 - 46.0 %   MCV 78.1 78.0 - 100.0 fL   MCH 24.0 (L) 26.0 - 34.0 pg    MCHC 30.7 30.0 - 36.0 g/dL   RDW 17.5 (H) 11.5 - 15.5 %   Platelets 314 150 - 400 K/uL    Comment: Performed at [facility], [address], [place], [place] [address]  CBC with Differential/Platelet     Status: Abnormal   Collection Time: 08/26/17  5:55 AM  Result Value Ref Range   WBC 7.7 4.0 - 10.5 K/uL   RBC 4.05 3.87 - 5.11 MIL/uL   Hemoglobin 9.6 (L) 12.0 - 15.0 g/dL   HCT 32.0 (L) 36.0 - 46.0 %   MCV 79.0 78.0 - 100.0 fL   MCH 23.7 (L) 26.0 - 34.0 pg   MCHC 30.0 30.0 - 36.0 g/dL   RDW 17.7 (H) 11.5 - 15.5 %   Platelets 338 150 - 400 K/uL   Neutrophils Relative % 65 %   Neutro Abs  5.0 1.7 - 7.7 K/uL   Lymphocytes Relative 22 %   Lymphs Abs 1.7 0.7 - 4.0 K/uL   Monocytes Relative 11 %   Monocytes Absolute 0.9 0.1 - 1.0 K/uL   Eosinophils Relative 2 %   Eosinophils Absolute 0.2 0.0 - 0.7 K/uL   Basophils Relative 0 %   Basophils Absolute 0.0 0.0 - 0.1 K/uL    Comment: Performed at [facility], [address] [place], [place] [address]    ABGS No results for input(s): PHART, PO2ART, TCO2, HCO3 in the last 72 hours.  Invalid input(s): PCO2 CULTURES Recent Results (from the past 240 hour(s))  MRSA PCR Screening     Status: None   Collection Time: 08/24/17  7:51 PM  Result Value Ref Range Status   MRSA by PCR NEGATIVE NEGATIVE Final    Comment:        The GeneXpert MRSA Assay (FDA approved for NASAL specimens only), is one component of a comprehensive MRSA colonization surveillance program. It is not intended to diagnose MRSA infection nor to guide or monitor treatment for MRSA infections. Performed at [facility], [address], [place], [place] [address]    Studies/Results: Dg Chest 1 View  Result Date: 08/24/2017 CLINICAL DATA:  84 year old female with confusion. EXAM: CHEST  1 VIEW COMPARISON:  Chest CT and radiographs 10/27/2016 and earlier. FINDINGS: AP view of the chest at 1501 hours. Stable lung volumes. Stable mild cardiomegaly and mediastinal  contours. Calcified aortic atherosclerosis. No pneumothorax, pulmonary edema, pleural effusion or acute pulmonary opacity identified. Negative visible bowel gas pattern. Cholecystectomy clips in the right upper quadrant. No acute osseous abnormality identified. IMPRESSION: No acute cardiopulmonary abnormality. Aortic Atherosclerosis (ICD10-I70.0). Electronically Signed   By: Genevie Ann M.D.   On: 08/24/2017 15:22   Ct Head Wo Contrast  Result Date: 08/24/2017 CLINICAL DATA:  Dizziness today EXAM: CT HEAD WITHOUT CONTRAST TECHNIQUE: Contiguous axial images were obtained from the base of the skull through the vertex without intravenous contrast. COMPARISON:  10/23/2011 FINDINGS: Brain: There is atrophy and chronic small vessel disease changes. No acute intracranial abnormality. Specifically, no hemorrhage, hydrocephalus, mass lesion, acute infarction, or significant intracranial injury. Vascular: No hyperdense vessel or unexpected calcification. Skull: No acute calvarial abnormality. Sinuses/Orbits: Visualized paranasal sinuses and mastoids clear. Orbital soft tissues unremarkable. Other: None IMPRESSION: No acute intracranial abnormality. Atrophy, chronic microvascular disease. Electronically Signed   By: Rolm Baptise M.D.   On: 08/24/2017 13:56   US Venous Img Upper Uni Right  Result Date: 08/24/2017 CLINICAL DATA:  Edema, pain.  History of lower extremity DVT. EXAM: RIGHT UPPER EXTREMITY VENOUS DOPPLER ULTRASOUND TECHNIQUE: Gray-scale sonography with graded compression, as well as color Doppler and duplex ultrasound were performed to evaluate the upper extremity deep venous system from the level of the subclavian vein and including the jugular, axillary, basilic and upper cephalic vein. Spectral Doppler was utilized to evaluate flow at rest and with distal augmentation maneuvers. COMPARISON:  None. FINDINGS: Thrombus within deep veins:  None visualized. Compressibility of deep veins:  Normal. Duplex waveform  respiratory phasicity:  Normal. Duplex waveform response to augmentation:  Normal. Venous reflux:  None visualized. Other findings: There is segmental occlusive thrombus in the cephalic vein near the elbow. Cephalic vein is patent and compressible peripherally in the forearm and more centrally. Limited images of the contralateral subclavian vein unremarkable. IMPRESSION: 1. Negative for right upper extremity DVT. 2. Segmental superficial thrombophlebitis in the cephalic vein at the level of the elbow. Electronically  Signed   By: Lucrezia Europe M.D.   On: 08/24/2017 15:34   Dg Hand Complete Right  Result Date: 08/24/2017 CLINICAL DATA:  84 year old female with pain and swelling in the hand with no known injury. EXAM: RIGHT HAND - COMPLETE 3+ VIEW COMPARISON:  [facility] right hand radiographs 08/15/2017. FINDINGS: Bone mineralization is stable and normal for age. Distal radius and ulna are stable and intact. Carpal bone alignment is stable and within normal limits. Metacarpals and phalanges are intact. Mild for age joint degeneration. No acute osseous abnormality identified. Dorsal soft tissue swelling at the level of the metacarpals has regressed compared to 08/15/2017. IMPRESSION: No acute osseous abnormality in the right hand. Electronically Signed   By: Genevie Ann M.D.   On: 08/24/2017 15:23    Medications:  Prior to Admission:  Medications Prior to Admission  Medication Sig Dispense Refill Last Dose  . acetaminophen (TYLENOL) 325 MG tablet Take 325 mg by mouth every 6 (six) hours as needed.   Taking  . albuterol (PROVENTIL HFA;VENTOLIN HFA) 108 (90 Base) MCG/ACT inhaler Inhale 2 puffs into the lungs every 6 (six) hours as needed for wheezing or shortness of breath. 1 Inhaler 2 Taking  . apixaban (ELIQUIS) 2.5 MG TABS tablet Take 5 mg by mouth 2 (two) times daily.    08/24/2017 at 1000  . carboxymethylcellulose (REFRESH PLUS) 0.5 % SOLN Place 1 drop into both eyes 4 (four) times daily.   08/24/2017  at 1000  . Dextromethorphan-guaiFENesin (ROBITUSSIN DM PO) Take by mouth as needed.   Taking  . furosemide (LASIX) 40 MG tablet Take 1 tablet (40 mg total) by mouth 2 (two) times daily. 180 tablet 3 08/24/2017 at 1000  . furosemide (LASIX) 40 MG tablet Take 40 mg by mouth daily as needed.    Taking  . gabapentin (NEURONTIN) 300 MG capsule Take 600 mg by mouth at bedtime.   08/23/2017 at 2000  . ketoconazole (NIZORAL) 2 % shampoo Apply 1 application topically once a week. As needed for antifungal.   Taking  . ketorolac (TORADOL) 10 MG tablet Take 10 mg by mouth every 6 (six) hours as needed.   08/22/2017 at 0929  . levothyroxine (SYNTHROID, LEVOTHROID) 88 MCG tablet Take 88 mcg by mouth daily before breakfast.   08/24/2017 at 0600  . LINZESS 290 MCG CAPS capsule Take 290 mcg by mouth daily before breakfast.    08/24/2017 at 0600  . neomycin-polymyxin-dexameth (MAXITROL) 0.1 % OINT Place 1 application into both eyes 3 (three) times daily.   08/24/2017 at 1000  . NIFEdipine (PROCARDIA-XL/ADALAT CC) 30 MG 24 hr tablet Take 30 mg by mouth daily.   08/24/2017 at 1000  . omeprazole (PRILOSEC) 20 MG capsule Take 20 mg by mouth 2 (two) times daily before a meal.    08/24/2017 at 1000  . oxybutynin (DITROPAN) 5 MG tablet Take 5 mg by mouth daily.    08/24/2017 at 1000  . polyethylene glycol powder (GLYCOLAX/MIRALAX) powder Take 17 g by mouth daily.    08/24/2017 at 0800  . pravastatin (PRAVACHOL) 40 MG tablet Take 40 mg by mouth at bedtime.    08/23/2017 at 2000  . risperiDONE (RISPERDAL) 0.25 MG tablet Take 0.25 mg by mouth daily.   08/24/2017 at 1000  . risperiDONE (RISPERDAL) 0.5 MG tablet Take 0.5 mg by mouth at bedtime.    08/23/2017 at 2000  . rOPINIRole (REQUIP) 0.5 MG tablet Take 0.5 mg by mouth at bedtime.   08/23/2017 at  2000  . simethicone (MYLICON) 80 MG chewable tablet Chew 80 mg by mouth every 8 (eight) hours as needed for flatulence.   Taking  . traZODone (DESYREL) 50 MG tablet Take 25 mg by mouth at bedtime.   08/23/2017  at 2000  . trolamine salicylate (ASPERCREME) 10 % cream Apply 1 application topically as needed for muscle pain.   08/22/2017 at 1105  . umeclidinium-vilanterol (ANORO ELLIPTA) 62.5-25 MCG/INH AEPB Inhale 1 puff into the lungs daily.   08/24/2017 at 1000   Scheduled: . furosemide  40 mg Oral BID  . gabapentin  600 mg Oral QHS  . levothyroxine  88 mcg Oral QAC breakfast  . mouth rinse  15 mL Mouth Rinse BID  . NIFEdipine  30 mg Oral Daily  . oxybutynin  5 mg Oral Daily  . pravastatin  40 mg Oral QHS  . risperiDONE  0.25 mg Oral Daily  . risperiDONE  0.5 mg Oral QHS  . rOPINIRole  0.5 mg Oral QHS  . sodium chloride flush  3 mL Intravenous Q12H  . traZODone  25 mg Oral QHS   Continuous: . sodium chloride Stopped (08/25/17 0143)   CMK:LKJZPH chloride, acetaminophen, albuterol, guaiFENesin-dextromethorphan, sodium chloride flush  Assesment: She was admitted with symptomatic anemia presumably from GI bleeding.  She is better after blood transfusion.  She has not had any overt bleeding.  She is chronically anticoagulated and that has been held  She has chronic diastolic heart failure which is stable Principal Problem:   GIB (gastrointestinal bleeding) Active Problems:   Microcytic anemia   Anxiety   Chronic diastolic CHF (congestive heart failure) (HCC)   CKD (chronic kidney disease)   Symptomatic anemia   GI bleed    Plan: Continue current treatments.  GI consult.    LOS: 1 day   Sebrina Kessner L 08/26/2017, 9:09 AM

## 2017-08-27 LAB — BASIC METABOLIC PANEL
ANION GAP: 8 (ref 5–15)
BUN: 32 mg/dL — ABNORMAL HIGH (ref 8–23)
CHLORIDE: 99 mmol/L (ref 98–111)
CO2: 27 mmol/L (ref 22–32)
Calcium: 8.9 mg/dL (ref 8.9–10.3)
Creatinine, Ser: 1.37 mg/dL — ABNORMAL HIGH (ref 0.44–1.00)
GFR calc non Af Amer: 31 mL/min — ABNORMAL LOW (ref >60)
GFR, EST AFRICAN AMERICAN: 36 mL/min — AB (ref >60)
Glucose, Bld: 128 mg/dL — ABNORMAL HIGH (ref 70–99)
POTASSIUM: 4.3 mmol/L (ref 3.5–5.1)
SODIUM: 134 mmol/L — AB (ref 135–145)

## 2017-08-27 LAB — CBC WITH DIFFERENTIAL/PLATELET
Basophils Absolute: 0 10*3/uL (ref 0.0–0.1)
Basophils Relative: 0 %
EOS ABS: 0.1 10*3/uL (ref 0.0–0.7)
Eosinophils Relative: 2 %
HCT: 33.1 % — ABNORMAL LOW (ref 36.0–46.0)
HEMOGLOBIN: 9.8 g/dL — AB (ref 12.0–15.0)
LYMPHS ABS: 1.4 10*3/uL (ref 0.7–4.0)
LYMPHS PCT: 20 %
MCH: 23.6 pg — AB (ref 26.0–34.0)
MCHC: 29.6 g/dL — ABNORMAL LOW (ref 30.0–36.0)
MCV: 79.6 fL (ref 78.0–100.0)
Monocytes Absolute: 0.8 10*3/uL (ref 0.1–1.0)
Monocytes Relative: 11 %
NEUTROS ABS: 4.6 10*3/uL (ref 1.7–7.7)
Neutrophils Relative %: 67 %
Platelets: 361 10*3/uL (ref 150–400)
RBC: 4.16 MIL/uL (ref 3.87–5.11)
RDW: 18.3 % — ABNORMAL HIGH (ref 11.5–15.5)
WBC: 7 10*3/uL (ref 4.0–10.5)

## 2017-08-27 MED ORDER — APIXABAN 5 MG PO TABS: 5.0000 mg | ORAL_TABLET | Freq: Two times a day (BID) | ORAL | Status: DC

## 2017-08-27 MED ORDER — APIXABAN 2.5 MG PO TABS: 2.5000 mg | ORAL_TABLET | Freq: Two times a day (BID) | ORAL | Status: DC

## 2017-08-27 MED ORDER — APIXABAN 2.5 MG PO TABS
2.5000 mg | ORAL_TABLET | Freq: Two times a day (BID) | ORAL | Status: DC
  Administered 2017-08-27 – 2017-08-29 (×5): 2.5 mg via ORAL
  Filled 2017-08-27 (×5): qty 1

## 2017-08-27 NOTE — Progress Notes (Signed)
Subjective: She says she feels okay.  GI note seen and appreciated.  She is going to start back on anticoagulation today but I think she can use the lower dose.  Objective: Vital signs in last 24 hours: Temp:  [97.8 F (36.6 C)-98.2 F (36.8 C)] 98.1 F (36.7 C) (07/06 0552) Pulse Rate:  [74-93] 76 (07/06 0552) Resp:  [16] 16 (07/06 0552) BP: (109-136)/(64-69) 117/69 (07/06 0552) SpO2:  [99 %-100 %] 99 % (07/06 0552) Weight change:  Last BM Date: 08/25/17  Intake/Output from previous day: 07/05 0701 - 07/06 0700 In: 720 [P.O.:720] Out: 1800 [Urine:1800]  PHYSICAL EXAM General appearance: alert, cooperative and no distress Resp: clear to auscultation bilaterally Cardio: regular rate and rhythm, S1, S2 normal, no murmur, click, rub or gallop GI: soft, non-tender; bowel sounds normal; no masses,  no organomegaly Extremities: extremities normal, atraumatic, no cyanosis or edema  Lab Results:  Results for orders placed or performed during the hospital encounter of 08/24/17 (from the past 48 hour(s))  CBC with Differential/Platelet     Status: Abnormal   Collection Time: 08/26/17  5:55 AM  Result Value Ref Range   WBC 7.7 4.0 - 10.5 K/uL   RBC 4.05 3.87 - 5.11 MIL/uL   Hemoglobin 9.6 (L) 12.0 - 15.0 g/dL   HCT 32.0 (L) 36.0 - 46.0 %   MCV 79.0 78.0 - 100.0 fL   MCH 23.7 (L) 26.0 - 34.0 pg   MCHC 30.0 30.0 - 36.0 g/dL   RDW 17.7 (H) 11.5 - 15.5 %   Platelets 338 150 - 400 K/uL   Neutrophils Relative % 65 %   Neutro Abs 5.0 1.7 - 7.7 K/uL   Lymphocytes Relative 22 %   Lymphs Abs 1.7 0.7 - 4.0 K/uL   Monocytes Relative 11 %   Monocytes Absolute 0.9 0.1 - 1.0 K/uL   Eosinophils Relative 2 %   Eosinophils Absolute 0.2 0.0 - 0.7 K/uL   Basophils Relative 0 %   Basophils Absolute 0.0 0.0 - 0.1 K/uL    Comment: Performed at [facility], [address], [place], [place] [address]  Basic metabolic panel     Status: Abnormal   Collection Time: 08/27/17  6:38 AM  Result Value  Ref Range   Sodium 134 (L) 135 - 145 mmol/L   Potassium 4.3 3.5 - 5.1 mmol/L   Chloride 99 98 - 111 mmol/L    Comment: Please note change in reference range.   CO2 27 22 - 32 mmol/L   Glucose, Bld 128 (H) 70 - 99 mg/dL    Comment: Please note change in reference range.   BUN 32 (H) 8 - 23 mg/dL    Comment: Please note change in reference range.   Creatinine, Ser 1.37 (H) 0.44 - 1.00 mg/dL   Calcium 8.9 8.9 - 10.3 mg/dL   GFR calc non Af Amer 31 (L) >60 mL/min   GFR calc Af Amer 36 (L) >60 mL/min    Comment: (NOTE) The eGFR has been calculated using the CKD EPI equation. This calculation has not been validated in all clinical situations. eGFR's persistently <60 mL/min signify possible Chronic Kidney Disease.    Anion gap 8 5 - 15    Comment: Performed at [facility], [address], [place], [place] [address]  CBC with Differential/Platelet     Status: Abnormal   Collection Time: 08/27/17  6:38 AM  Result Value Ref Range   WBC 7.0 4.0 - 10.5 K/uL   RBC  4.16 3.87 - 5.11 MIL/uL   Hemoglobin 9.8 (L) 12.0 - 15.0 g/dL   HCT 33.1 (L) 36.0 - 46.0 %   MCV 79.6 78.0 - 100.0 fL   MCH 23.6 (L) 26.0 - 34.0 pg   MCHC 29.6 (L) 30.0 - 36.0 g/dL   RDW 18.3 (H) 11.5 - 15.5 %   Platelets 361 150 - 400 K/uL   Neutrophils Relative % 67 %   Neutro Abs 4.6 1.7 - 7.7 K/uL   Lymphocytes Relative 20 %   Lymphs Abs 1.4 0.7 - 4.0 K/uL   Monocytes Relative 11 %   Monocytes Absolute 0.8 0.1 - 1.0 K/uL   Eosinophils Relative 2 %   Eosinophils Absolute 0.1 0.0 - 0.7 K/uL   Basophils Relative 0 %   Basophils Absolute 0.0 0.0 - 0.1 K/uL    Comment: Performed at [facility], [address], [place], [place] [address]    ABGS No results for input(s): PHART, PO2ART, TCO2, HCO3 in the last 72 hours.  Invalid input(s): PCO2 CULTURES Recent Results (from the past 240 hour(s))  MRSA PCR Screening     Status: None   Collection Time: 08/24/17  7:51 PM  Result Value Ref Range Status   MRSA by PCR  NEGATIVE NEGATIVE Final    Comment:        The GeneXpert MRSA Assay (FDA approved for NASAL specimens only), is one component of a comprehensive MRSA colonization surveillance program. It is not intended to diagnose MRSA infection nor to guide or monitor treatment for MRSA infections. Performed at [facility], [address] [place], [place] [address]    Studies/Results: No results found.  Medications:  Prior to Admission:  Medications Prior to Admission  Medication Sig Dispense Refill Last Dose  . acetaminophen (TYLENOL) 325 MG tablet Take 325 mg by mouth every 6 (six) hours as needed.   Taking  . albuterol (PROVENTIL HFA;VENTOLIN HFA) 108 (90 Base) MCG/ACT inhaler Inhale 2 puffs into the lungs every 6 (six) hours as needed for wheezing or shortness of breath. 1 Inhaler 2 Taking  . apixaban (ELIQUIS) 2.5 MG TABS tablet Take 5 mg by mouth 2 (two) times daily.    08/24/2017 at 1000  . carboxymethylcellulose (REFRESH PLUS) 0.5 % SOLN Place 1 drop into both eyes 4 (four) times daily.   08/24/2017 at 1000  . Dextromethorphan-guaiFENesin (ROBITUSSIN DM PO) Take by mouth as needed.   Taking  . furosemide (LASIX) 40 MG tablet Take 1 tablet (40 mg total) by mouth 2 (two) times daily. 180 tablet 3 08/24/2017 at 1000  . furosemide (LASIX) 40 MG tablet Take 40 mg by mouth daily as needed.    Taking  . gabapentin (NEURONTIN) 300 MG capsule Take 600 mg by mouth at bedtime.   08/23/2017 at 2000  . ketoconazole (NIZORAL) 2 % shampoo Apply 1 application topically once a week. As needed for antifungal.   Taking  . ketorolac (TORADOL) 10 MG tablet Take 10 mg by mouth every 6 (six) hours as needed.   08/22/2017 at 0929  . levothyroxine (SYNTHROID, LEVOTHROID) 88 MCG tablet Take 88 mcg by mouth daily before breakfast.   08/24/2017 at 0600  . LINZESS 290 MCG CAPS capsule Take 290 mcg by mouth daily before breakfast.    08/24/2017 at 0600  . neomycin-polymyxin-dexameth (MAXITROL) 0.1 % OINT Place 1 application into  both eyes 3 (three) times daily.   08/24/2017 at 1000  . NIFEdipine (PROCARDIA-XL/ADALAT CC) 30 MG 24 hr tablet Take  30 mg by mouth daily.   08/24/2017 at 1000  . omeprazole (PRILOSEC) 20 MG capsule Take 20 mg by mouth 2 (two) times daily before a meal.    08/24/2017 at 1000  . oxybutynin (DITROPAN) 5 MG tablet Take 5 mg by mouth daily.    08/24/2017 at 1000  . polyethylene glycol powder (GLYCOLAX/MIRALAX) powder Take 17 g by mouth daily.    08/24/2017 at 0800  . pravastatin (PRAVACHOL) 40 MG tablet Take 40 mg by mouth at bedtime.    08/23/2017 at 2000  . risperiDONE (RISPERDAL) 0.25 MG tablet Take 0.25 mg by mouth daily.   08/24/2017 at 1000  . risperiDONE (RISPERDAL) 0.5 MG tablet Take 0.5 mg by mouth at bedtime.    08/23/2017 at 2000  . rOPINIRole (REQUIP) 0.5 MG tablet Take 0.5 mg by mouth at bedtime.   08/23/2017 at 2000  . simethicone (MYLICON) 80 MG chewable tablet Chew 80 mg by mouth every 8 (eight) hours as needed for flatulence.   Taking  . traZODone (DESYREL) 50 MG tablet Take 25 mg by mouth at bedtime.   08/23/2017 at 2000  . trolamine salicylate (ASPERCREME) 10 % cream Apply 1 application topically as needed for muscle pain.   08/22/2017 at 1105  . umeclidinium-vilanterol (ANORO ELLIPTA) 62.5-25 MCG/INH AEPB Inhale 1 puff into the lungs daily.   08/24/2017 at 1000   Scheduled: . apixaban  5 mg Oral BID  . furosemide  40 mg Oral BID  . gabapentin  600 mg Oral QHS  . levothyroxine  88 mcg Oral QAC breakfast  . mouth rinse  15 mL Mouth Rinse BID  . NIFEdipine  30 mg Oral Daily  . oxybutynin  5 mg Oral Daily  . pantoprazole  40 mg Oral Daily  . pravastatin  40 mg Oral QHS  . risperiDONE  0.25 mg Oral Daily  . risperiDONE  0.5 mg Oral QHS  . rOPINIRole  0.5 mg Oral QHS  . sodium chloride flush  3 mL Intravenous Q12H  . traZODone  25 mg Oral QHS   Continuous: . sodium chloride Stopped (08/25/17 0143)   QZR:[id number] chloride, acetaminophen, albuterol, guaiFENesin-dextromethorphan, sodium chloride  flush  Assesment: She was admitted with iron deficiency anemia presumably from GI bleeding.  She received 2 units of packed red blood cells and she is stable.  It is okay to start anticoagulation back per GI. Principal Problem:   GIB (gastrointestinal bleeding) Active Problems:   Microcytic anemia   Anxiety   Chronic diastolic CHF (congestive heart failure) (HCC)   CKD (chronic kidney disease)   Symptomatic anemia   GI bleed   Iron deficiency anemia due to chronic blood loss    Plan: Restart anticoagulation.  Check CBC in the morning.  Probable discharge on 7 8    LOS: 2 days   Bryce Kimble L 08/27/2017, 10:53 AM

## 2017-08-28 DIAGNOSIS — D500 Iron deficiency anemia secondary to blood loss (chronic): Secondary | ICD-10-CM | POA: Diagnosis present

## 2017-08-28 LAB — CBC WITH DIFFERENTIAL/PLATELET
BASOS ABS: 0 10*3/uL (ref 0.0–0.1)
Basophils Relative: 1 %
EOS ABS: 0.1 10*3/uL (ref 0.0–0.7)
EOS PCT: 1 %
HCT: 32.8 % — ABNORMAL LOW (ref 36.0–46.0)
Hemoglobin: 9.8 g/dL — ABNORMAL LOW (ref 12.0–15.0)
LYMPHS ABS: 1.4 10*3/uL (ref 0.7–4.0)
Lymphocytes Relative: 18 %
MCH: 23.7 pg — AB (ref 26.0–34.0)
MCHC: 29.9 g/dL — ABNORMAL LOW (ref 30.0–36.0)
MCV: 79.4 fL (ref 78.0–100.0)
MONO ABS: 0.7 10*3/uL (ref 0.1–1.0)
Monocytes Relative: 9 %
Neutro Abs: 5.3 10*3/uL (ref 1.7–7.7)
Neutrophils Relative %: 71 %
PLATELETS: 344 10*3/uL (ref 150–400)
RBC: 4.13 MIL/uL (ref 3.87–5.11)
RDW: 18.4 % — AB (ref 11.5–15.5)
WBC: 7.6 10*3/uL (ref 4.0–10.5)

## 2017-08-28 MED ORDER — POLYETHYLENE GLYCOL 3350 17 G PO PACK
17.0000 g | PACK | Freq: Every day | ORAL | Status: DC
Start: 2017-08-28 — End: 2017-08-29
  Administered 2017-08-28 – 2017-08-29 (×2): 17 g via ORAL
  Filled 2017-08-28 (×2): qty 1

## 2017-08-28 NOTE — Progress Notes (Signed)
Subjective: She says she feels okay except she is constipated.  She has no other new complaints.  No overt bleeding.  Her hemoglobin level seems stable no overt symptoms of heart failure.  Objective: Vital signs in last 24 hours: Temp:  [98 F (36.7 C)-98.5 F (36.9 C)] 98.5 F (36.9 C) (07/07 0539) Pulse Rate:  [91-98] 91 (07/07 0539) Resp:  [15-18] 18 (07/07 0539) BP: (112-113)/(47-65) 113/65 (07/07 0539) SpO2:  [96 %-97 %] 96 % (07/07 0539) Weight:  [84 kg (185 lb 3 oz)] 84 kg (185 lb 3 oz) (07/07 0600) Weight change:  Last BM Date: 08/25/17  Intake/Output from previous day: 07/06 0701 - 07/07 0700 In: 340 [P.O.:340] Out: 2000 [Urine:2000]  PHYSICAL EXAM General appearance: alert, cooperative and no distress Resp: clear to auscultation bilaterally Cardio: regular rate and rhythm, S1, S2 normal, no murmur, click, rub or gallop GI: soft, non-tender; bowel sounds normal; no masses,  no organomegaly Extremities: extremities normal, atraumatic, no cyanosis or edema  Lab Results:  Results for orders placed or performed during the hospital encounter of 08/24/17 (from the past 48 hour(s))  Basic metabolic panel     Status: Abnormal   Collection Time: 08/27/17  6:38 AM  Result Value Ref Range   Sodium 134 (L) 135 - 145 mmol/L   Potassium 4.3 3.5 - 5.1 mmol/L   Chloride 99 98 - 111 mmol/L    Comment: Please note change in reference range.   CO2 27 22 - 32 mmol/L   Glucose, Bld 128 (H) 70 - 99 mg/dL    Comment: Please note change in reference range.   BUN 32 (H) 8 - 23 mg/dL    Comment: Please note change in reference range.   Creatinine, Ser 1.37 (H) 0.44 - 1.00 mg/dL   Calcium 8.9 8.9 - 10.3 mg/dL   GFR calc non Af Amer 31 (L) >60 mL/min   GFR calc Af Amer 36 (L) >60 mL/min    Comment: (NOTE) The eGFR has been calculated using the CKD EPI equation. This calculation has not been validated in all clinical situations. eGFR's persistently <60 mL/min signify possible Chronic  Kidney Disease.    Anion gap 8 5 - 15    Comment: Performed at [facility], [address], [place], [place] [address]  CBC with Differential/Platelet     Status: Abnormal   Collection Time: 08/27/17  6:38 AM  Result Value Ref Range   WBC 7.0 4.0 - 10.5 K/uL   RBC 4.16 3.87 - 5.11 MIL/uL   Hemoglobin 9.8 (L) 12.0 - 15.0 g/dL   HCT 33.1 (L) 36.0 - 46.0 %   MCV 79.6 78.0 - 100.0 fL   MCH 23.6 (L) 26.0 - 34.0 pg   MCHC 29.6 (L) 30.0 - 36.0 g/dL   RDW 18.3 (H) 11.5 - 15.5 %   Platelets 361 150 - 400 K/uL   Neutrophils Relative % 67 %   Neutro Abs 4.6 1.7 - 7.7 K/uL   Lymphocytes Relative 20 %   Lymphs Abs 1.4 0.7 - 4.0 K/uL   Monocytes Relative 11 %   Monocytes Absolute 0.8 0.1 - 1.0 K/uL   Eosinophils Relative 2 %   Eosinophils Absolute 0.1 0.0 - 0.7 K/uL   Basophils Relative 0 %   Basophils Absolute 0.0 0.0 - 0.1 K/uL    Comment: Performed at [facility], [address], [place],  [address]  CBC with Differential/Platelet     Status: Abnormal   Collection Time: 08/28/17  7:09 AM  Result Value Ref Range   WBC 7.6 4.0 - 10.5 K/uL   RBC 4.13 3.87 - 5.11 MIL/uL   Hemoglobin 9.8 (L) 12.0 - 15.0 g/dL   HCT 32.8 (L) 36.0 - 46.0 %   MCV 79.4 78.0 - 100.0 fL   MCH 23.7 (L) 26.0 - 34.0 pg   MCHC 29.9 (L) 30.0 - 36.0 g/dL   RDW 18.4 (H) 11.5 - 15.5 %   Platelets 344 150 - 400 K/uL   Neutrophils Relative % 71 %   Neutro Abs 5.3 1.7 - 7.7 K/uL   Lymphocytes Relative 18 %   Lymphs Abs 1.4 0.7 - 4.0 K/uL   Monocytes Relative 9 %   Monocytes Absolute 0.7 0.1 - 1.0 K/uL   Eosinophils Relative 1 %   Eosinophils Absolute 0.1 0.0 - 0.7 K/uL   Basophils Relative 1 %   Basophils Absolute 0.0 0.0 - 0.1 K/uL    Comment: Performed at [facility], [address] [place], [place] [address]    ABGS No results for input(s): PHART, PO2ART, TCO2, HCO3 in the last 72 hours.  Invalid input(s): PCO2 CULTURES Recent Results (from the past 240 hour(s))  MRSA PCR Screening     Status:  None   Collection Time: 08/24/17  7:51 PM  Result Value Ref Range Status   MRSA by PCR NEGATIVE NEGATIVE Final    Comment:        The GeneXpert MRSA Assay (FDA approved for NASAL specimens only), is one component of a comprehensive MRSA colonization surveillance program. It is not intended to diagnose MRSA infection nor to guide or monitor treatment for MRSA infections. Performed at [facility], [address], [place], [place] [address]    Studies/Results: No results found.  Medications:  Prior to Admission:  Medications Prior to Admission  Medication Sig Dispense Refill Last Dose  . acetaminophen (TYLENOL) 325 MG tablet Take 325 mg by mouth every 6 (six) hours as needed.   Taking  . albuterol (PROVENTIL HFA;VENTOLIN HFA) 108 (90 Base) MCG/ACT inhaler Inhale 2 puffs into the lungs every 6 (six) hours as needed for wheezing or shortness of breath. 1 Inhaler 2 Taking  . apixaban (ELIQUIS) 2.5 MG TABS tablet Take 5 mg by mouth 2 (two) times daily.    08/24/2017 at 1000  . carboxymethylcellulose (REFRESH PLUS) 0.5 % SOLN Place 1 drop into both eyes 4 (four) times daily.   08/24/2017 at 1000  . Dextromethorphan-guaiFENesin (ROBITUSSIN DM PO) Take by mouth as needed.   Taking  . furosemide (LASIX) 40 MG tablet Take 1 tablet (40 mg total) by mouth 2 (two) times daily. 180 tablet 3 08/24/2017 at 1000  . furosemide (LASIX) 40 MG tablet Take 40 mg by mouth daily as needed.    Taking  . gabapentin (NEURONTIN) 300 MG capsule Take 600 mg by mouth at bedtime.   08/23/2017 at 2000  . ketoconazole (NIZORAL) 2 % shampoo Apply 1 application topically once a week. As needed for antifungal.   Taking  . ketorolac (TORADOL) 10 MG tablet Take 10 mg by mouth every 6 (six) hours as needed.   08/22/2017 at 0929  . levothyroxine (SYNTHROID, LEVOTHROID) 88 MCG tablet Take 88 mcg by mouth daily before breakfast.   08/24/2017 at 0600  . LINZESS 290 MCG CAPS capsule Take 290 mcg by mouth daily before breakfast.    08/24/2017  at 0600  . neomycin-polymyxin-dexameth (MAXITROL) 0.1 % OINT Place 1 application into both eyes 3 (three)  times daily.   08/24/2017 at 1000  . NIFEdipine (PROCARDIA-XL/ADALAT CC) 30 MG 24 hr tablet Take 30 mg by mouth daily.   08/24/2017 at 1000  . omeprazole (PRILOSEC) 20 MG capsule Take 20 mg by mouth 2 (two) times daily before a meal.    08/24/2017 at 1000  . oxybutynin (DITROPAN) 5 MG tablet Take 5 mg by mouth daily.    08/24/2017 at 1000  . polyethylene glycol powder (GLYCOLAX/MIRALAX) powder Take 17 g by mouth daily.    08/24/2017 at 0800  . pravastatin (PRAVACHOL) 40 MG tablet Take 40 mg by mouth at bedtime.    08/23/2017 at 2000  . risperiDONE (RISPERDAL) 0.25 MG tablet Take 0.25 mg by mouth daily.   08/24/2017 at 1000  . risperiDONE (RISPERDAL) 0.5 MG tablet Take 0.5 mg by mouth at bedtime.    08/23/2017 at 2000  . rOPINIRole (REQUIP) 0.5 MG tablet Take 0.5 mg by mouth at bedtime.   08/23/2017 at 2000  . simethicone (MYLICON) 80 MG chewable tablet Chew 80 mg by mouth every 8 (eight) hours as needed for flatulence.   Taking  . traZODone (DESYREL) 50 MG tablet Take 25 mg by mouth at bedtime.   08/23/2017 at 2000  . trolamine salicylate (ASPERCREME) 10 % cream Apply 1 application topically as needed for muscle pain.   08/22/2017 at 1105  . umeclidinium-vilanterol (ANORO ELLIPTA) 62.5-25 MCG/INH AEPB Inhale 1 puff into the lungs daily.   08/24/2017 at 1000   Scheduled: . apixaban  2.5 mg Oral BID  . furosemide  40 mg Oral BID  . gabapentin  600 mg Oral QHS  . levothyroxine  88 mcg Oral QAC breakfast  . mouth rinse  15 mL Mouth Rinse BID  . NIFEdipine  30 mg Oral Daily  . oxybutynin  5 mg Oral Daily  . pantoprazole  40 mg Oral Daily  . polyethylene glycol  17 g Oral Daily  . pravastatin  40 mg Oral QHS  . risperiDONE  0.25 mg Oral Daily  . risperiDONE  0.5 mg Oral QHS  . rOPINIRole  0.5 mg Oral QHS  . sodium chloride flush  3 mL Intravenous Q12H  . traZODone  25 mg Oral QHS   Continuous: . sodium  chloride Stopped (08/25/17 0143)   HOZ:[id number] chloride, acetaminophen, albuterol, guaiFENesin-dextromethorphan, sodium chloride flush  Assesment: She was admitted with symptomatic anemia.  This is iron deficiency and presumably from GI bleeding.  No overt bleeding now.  She does not want a work-up.  She has chronic diastolic heart failure that is stable  She has chronic hypoxic respiratory failure on oxygen Principal Problem:   GIB (gastrointestinal bleeding) Active Problems:   Microcytic anemia   Anxiety   Chronic diastolic CHF (congestive heart failure) (HCC)   CKD (chronic kidney disease)   Symptomatic anemia   GI bleed   Iron deficiency anemia due to chronic blood loss    Plan: Check hemoglobin level in the morning and if stable she will be discharged back to her assisted living facility.  She is back on anticoagulation    LOS: 3 days   Chevette Fee L 08/28/2017, 10:23 AM

## 2017-08-29 MED ORDER — FERROUS SULFATE 325 (65 FE) MG PO TABS: 325.0000 mg | ORAL_TABLET | Freq: Every day | ORAL | 3 refills | Status: AC

## 2017-08-29 MED ORDER — APIXABAN 2.5 MG PO TABS: 2.5000 mg | ORAL_TABLET | Freq: Two times a day (BID) | ORAL | 5 refills | Status: AC

## 2017-08-29 NOTE — NC FL2 (Signed)
[place] LEVEL OF CARE SCREENING TOOL     IDENTIFICATION  Patient Name: Christine Hancock Birthdate: 1917/10/08 Sex: female Admission Date (Current Location): 08/24/2017  [facility] and [place] Number:  [employer] and Address:  [place] [address], [place]      Provider Number: [id number]  Attending Physician Name and Address:  Sinda Du, MD  Relative Name and Phone Number:       Current Level of Care: Hospital Recommended Level of Care: [place] Prior Approval Number:    Date Approved/Denied:   PASRR Number:    Discharge Plan: Other (Comment)(ALF)    Current Diagnoses: Patient Active Problem List   Diagnosis Date Noted  . Anemia due to chronic blood loss 08/28/2017  . Iron deficiency anemia due to chronic blood loss   . GI bleed 08/25/2017  . GIB (gastrointestinal bleeding) 08/24/2017  . Chronic diastolic CHF (congestive heart failure) ([place]) 08/24/2017  . Chronic kidney disease (CKD), stage III (moderate) ([place]) 08/24/2017  . Symptomatic anemia   . Acute on chronic diastolic heart failure ([place]) 10/30/2016  . Acute lower UTI 10/27/2016  . AKI (acute kidney injury) ([place]) 10/27/2016  . Dehydration 10/24/2011  . Acute on chronic renal failure ([place]) 10/24/2011  . Failure to thrive in adult 10/24/2011  . Microcytic anemia 10/24/2011  . Anxiety 10/24/2011  . Hypothyroidism 10/24/2011    Orientation RESPIRATION BLADDER Height & Weight     Self, Place, Situation  O2 Continent Weight: 178 lb 9.2 oz (81 kg) Height:  5\' 2"  (157.5 cm)  BEHAVIORAL SYMPTOMS/MOOD NEUROLOGICAL BOWEL NUTRITION STATUS      Continent Diet(regular diet)  AMBULATORY STATUS COMMUNICATION OF NEEDS Skin   Independent(with walker) Verbally Normal                       Personal Care Assistance Level of Assistance  Bathing, Feeding, Dressing Bathing Assistance: Limited assistance Feeding assistance: Independent Dressing  Assistance: Limited assistance     Functional Limitations Info  Sight, Hearing, Speech Sight Info: Adequate Hearing Info: Adequate Speech Info: Adequate    SPECIAL CARE FACTORS FREQUENCY                       Contractures Contractures Info: Not present    Additional Factors Info  Code Status, Allergies, Psychotropic Code Status Info: DNR Allergies Info: No Known Allergies Psychotropic Info: Risperdal         Current Medications (08/29/2017):  This is the current hospital active medication list Current Facility-Administered Medications  Medication Dose Route Frequency Provider Last Rate Last Dose  . 0.9 %  sodium chloride infusion  250 mL Intravenous PRN Phillips Grout, MD   Stopped at 08/25/17 0143  . acetaminophen (TYLENOL) tablet 650 mg  650 mg Oral Q6H PRN Jani Gravel, MD   650 mg at 08/27/17 2219  . albuterol (PROVENTIL) (2.5 MG/3ML) 0.083% nebulizer solution 2.5 mg  2.5 mg Nebulization Q6H PRN Phillips Grout, MD      . apixaban Arne Cleveland) tablet 2.5 mg  2.5 mg Oral BID Sinda Du, MD   2.5 mg at 08/29/17 0924  . furosemide (LASIX) tablet 40 mg  40 mg Oral BID Phillips Grout, MD   40 mg at 08/29/17 0924  . gabapentin (NEURONTIN) capsule 600 mg  600 mg Oral QHS Derrill Kay A, MD   600 mg at 08/28/17 2040  . guaiFENesin-dextromethorphan (ROBITUSSIN DM)  100-10 MG/5ML syrup 5 mL  5 mL Oral Q4H PRN Phillips Grout, MD   5 mL at 08/24/17 2049  . levothyroxine (SYNTHROID, LEVOTHROID) tablet 88 mcg  88 mcg Oral QAC breakfast Phillips Grout, MD   88 mcg at 08/29/17 0925  . MEDLINE mouth rinse  15 mL Mouth Rinse BID Phillips Grout, MD   15 mL at 08/29/17 0924  . NIFEdipine (PROCARDIA-XL/ADALAT-CC/NIFEDICAL-XL) 24 hr tablet 30 mg  30 mg Oral Daily Derrill Kay A, MD   30 mg at 08/29/17 0924  . oxybutynin (DITROPAN) tablet 5 mg  5 mg Oral Daily Derrill Kay A, MD   5 mg at 08/29/17 0924  . pantoprazole (PROTONIX) EC tablet 40 mg  40 mg Oral Daily Annitta Needs, NP   40  mg at 08/29/17 8502  . polyethylene glycol (MIRALAX / GLYCOLAX) packet 17 g  17 g Oral Daily Sinda Du, MD   17 g at 08/29/17 0925  . pravastatin (PRAVACHOL) tablet 40 mg  40 mg Oral QHS Derrill Kay A, MD   40 mg at 08/28/17 2041  . risperiDONE (RISPERDAL) tablet 0.25 mg  0.25 mg Oral Daily Derrill Kay A, MD   0.25 mg at 08/29/17 0924  . risperiDONE (RISPERDAL) tablet 0.5 mg  0.5 mg Oral QHS Derrill Kay A, MD   0.5 mg at 08/28/17 2040  . rOPINIRole (REQUIP) tablet 0.5 mg  0.5 mg Oral QHS Derrill Kay A, MD   0.5 mg at 08/28/17 2041  . sodium chloride flush (NS) 0.9 % injection 3 mL  3 mL Intravenous Q12H Derrill Kay A, MD   3 mL at 08/28/17 2042  . sodium chloride flush (NS) 0.9 % injection 3 mL  3 mL Intravenous PRN Phillips Grout, MD      . traZODone (DESYREL) tablet 25 mg  25 mg Oral QHS Derrill Kay A, MD   25 mg at 08/28/17 2041     Discharge Medications: Medication List    TAKE these medications   acetaminophen 325 MG tablet Commonly known as:  TYLENOL Take 325 mg by mouth every 6 (six) hours as needed.   albuterol 108 (90 Base) MCG/ACT inhaler Commonly known as:  PROVENTIL HFA;VENTOLIN HFA Inhale 2 puffs into the lungs every 6 (six) hours as needed for wheezing or shortness of breath.   ANORO ELLIPTA 62.5-25 MCG/INH Aepb Generic drug:  umeclidinium-vilanterol Inhale 1 puff into the lungs daily.   apixaban 2.5 MG Tabs tablet Commonly known as:  ELIQUIS Take 1 tablet (2.5 mg total) by mouth 2 (two) times daily. What changed:  how much to take   carboxymethylcellulose 0.5 % Soln Commonly known as:  REFRESH PLUS Place 1 drop into both eyes 4 (four) times daily.   ferrous sulfate 325 (65 FE) MG tablet Take 1 tablet (325 mg total) by mouth daily.   furosemide 40 MG tablet Commonly known as:  LASIX Take 40 mg by mouth daily as needed.   furosemide 40 MG tablet Commonly known as:  LASIX Take 1 tablet (40 mg total) by mouth 2 (two) times daily.    gabapentin 300 MG capsule Commonly known as:  NEURONTIN Take 600 mg by mouth at bedtime.   ketoconazole 2 % shampoo Commonly known as:  NIZORAL Apply 1 application topically once a week. As needed for antifungal.   ketorolac 10 MG tablet Commonly known as:  TORADOL Take 10 mg by mouth every 6 (six) hours as needed.   levothyroxine 88  MCG tablet Commonly known as:  SYNTHROID, LEVOTHROID Take 88 mcg by mouth daily before breakfast.   LINZESS 290 MCG Caps capsule Generic drug:  linaclotide Take 290 mcg by mouth daily before breakfast.   neomycin-polymyxin-dexameth 0.1 % Oint Commonly known as:  MAXITROL Place 1 application into both eyes 3 (three) times daily.   NIFEdipine 30 MG 24 hr tablet Commonly known as:  PROCARDIA-XL/ADALAT CC Take 30 mg by mouth daily.   omeprazole 20 MG capsule Commonly known as:  PRILOSEC Take 20 mg by mouth 2 (two) times daily before a meal.   oxybutynin 5 MG tablet Commonly known as:  DITROPAN Take 5 mg by mouth daily.   polyethylene glycol powder powder Commonly known as:  GLYCOLAX/MIRALAX Take 17 g by mouth daily.   pravastatin 40 MG tablet Commonly known as:  PRAVACHOL Take 40 mg by mouth at bedtime.   risperiDONE 0.5 MG tablet Commonly known as:  RISPERDAL Take 0.5 mg by mouth at bedtime.   risperiDONE 0.25 MG tablet Commonly known as:  RISPERDAL Take 0.25 mg by mouth daily.   ROBITUSSIN DM PO Take by mouth as needed.   rOPINIRole 0.5 MG tablet Commonly known as:  REQUIP Take 0.5 mg by mouth at bedtime.   simethicone 80 MG chewable tablet Commonly known as:  MYLICON Chew 80 mg by mouth every 8 (eight) hours as needed for flatulence.   traZODone 50 MG tablet Commonly known as:  DESYREL Take 25 mg by mouth at bedtime.   trolamine salicylate 10 % cream Commonly known as:  ASPERCREME Apply 1 application topically as needed for muscle pain.           Relevant Imaging Results:  Relevant Lab  Results:   Additional Information    Iain Sawchuk, Clydene Pugh, LCSW

## 2017-08-29 NOTE — Progress Notes (Signed)
Subjective: She says she feels better and wants to go home.  No bleeding.  No other new complaints.  No overt symptoms of heart failure  Objective: Vital signs in last 24 hours: Temp:  [98.1 F (36.7 C)-98.4 F (36.9 C)] 98.3 F (36.8 C) (07/08 0701) Pulse Rate:  [88-95] 94 (07/08 0701) Resp:  [16-20] 20 (07/08 0701) BP: (122-123)/(69-71) 122/70 (07/08 0701) SpO2:  [97 %-100 %] 100 % (07/08 0701) Weight:  [81 kg (178 lb 9.2 oz)] 81 kg (178 lb 9.2 oz) (07/08 0701) Weight change:  Last BM Date: 08/26/17  Intake/Output from previous day: 07/07 0701 - 07/08 0700 In: 720 [P.O.:720] Out: 1150 [Urine:1150]  PHYSICAL EXAM General appearance: alert, cooperative and no distress Resp: clear to auscultation bilaterally Cardio: regular rate and rhythm, S1, S2 normal, no murmur, click, rub or gallop GI: soft, non-tender; bowel sounds normal; no masses,  no organomegaly Extremities: extremities normal, atraumatic, no cyanosis or edema  Lab Results:  Results for orders placed or performed during the hospital encounter of 08/24/17 (from the past 48 hour(s))  CBC with Differential/Platelet     Status: Abnormal   Collection Time: 08/28/17  7:09 AM  Result Value Ref Range   WBC 7.6 4.0 - 10.5 K/uL   RBC 4.13 3.87 - 5.11 MIL/uL   Hemoglobin 9.8 (L) 12.0 - 15.0 g/dL   HCT 32.8 (L) 36.0 - 46.0 %   MCV 79.4 78.0 - 100.0 fL   MCH 23.7 (L) 26.0 - 34.0 pg   MCHC 29.9 (L) 30.0 - 36.0 g/dL   RDW 18.4 (H) 11.5 - 15.5 %   Platelets 344 150 - 400 K/uL   Neutrophils Relative % 71 %   Neutro Abs 5.3 1.7 - 7.7 K/uL   Lymphocytes Relative 18 %   Lymphs Abs 1.4 0.7 - 4.0 K/uL   Monocytes Relative 9 %   Monocytes Absolute 0.7 0.1 - 1.0 K/uL   Eosinophils Relative 1 %   Eosinophils Absolute 0.1 0.0 - 0.7 K/uL   Basophils Relative 1 %   Basophils Absolute 0.0 0.0 - 0.1 K/uL    Comment: Performed at [facility], [address], [place], [place] [address]    ABGS No results for input(s): PHART,  PO2ART, TCO2, HCO3 in the last 72 hours.  Invalid input(s): PCO2 CULTURES Recent Results (from the past 240 hour(s))  MRSA PCR Screening     Status: None   Collection Time: 08/24/17  7:51 PM  Result Value Ref Range Status   MRSA by PCR NEGATIVE NEGATIVE Final    Comment:        The GeneXpert MRSA Assay (FDA approved for NASAL specimens only), is one component of a comprehensive MRSA colonization surveillance program. It is not intended to diagnose MRSA infection nor to guide or monitor treatment for MRSA infections. Performed at [facility], [address] [place], [place] [address]    Studies/Results: No results found.  Medications:  Prior to Admission:  Medications Prior to Admission  Medication Sig Dispense Refill Last Dose  . acetaminophen (TYLENOL) 325 MG tablet Take 325 mg by mouth every 6 (six) hours as needed.   Taking  . albuterol (PROVENTIL HFA;VENTOLIN HFA) 108 (90 Base) MCG/ACT inhaler Inhale 2 puffs into the lungs every 6 (six) hours as needed for wheezing or shortness of breath. 1 Inhaler 2 Taking  . apixaban (ELIQUIS) 2.5 MG TABS tablet Take 5 mg by mouth 2 (two) times daily.    08/24/2017 at 1000  .  carboxymethylcellulose (REFRESH PLUS) 0.5 % SOLN Place 1 drop into both eyes 4 (four) times daily.   08/24/2017 at 1000  . Dextromethorphan-guaiFENesin (ROBITUSSIN DM PO) Take by mouth as needed.   Taking  . furosemide (LASIX) 40 MG tablet Take 1 tablet (40 mg total) by mouth 2 (two) times daily. 180 tablet 3 08/24/2017 at 1000  . furosemide (LASIX) 40 MG tablet Take 40 mg by mouth daily as needed.    Taking  . gabapentin (NEURONTIN) 300 MG capsule Take 600 mg by mouth at bedtime.   08/23/2017 at 2000  . ketoconazole (NIZORAL) 2 % shampoo Apply 1 application topically once a week. As needed for antifungal.   Taking  . ketorolac (TORADOL) 10 MG tablet Take 10 mg by mouth every 6 (six) hours as needed.   08/22/2017 at 0929  . levothyroxine (SYNTHROID, LEVOTHROID) 88 MCG tablet  Take 88 mcg by mouth daily before breakfast.   08/24/2017 at 0600  . LINZESS 290 MCG CAPS capsule Take 290 mcg by mouth daily before breakfast.    08/24/2017 at 0600  . neomycin-polymyxin-dexameth (MAXITROL) 0.1 % OINT Place 1 application into both eyes 3 (three) times daily.   08/24/2017 at 1000  . NIFEdipine (PROCARDIA-XL/ADALAT CC) 30 MG 24 hr tablet Take 30 mg by mouth daily.   08/24/2017 at 1000  . omeprazole (PRILOSEC) 20 MG capsule Take 20 mg by mouth 2 (two) times daily before a meal.    08/24/2017 at 1000  . oxybutynin (DITROPAN) 5 MG tablet Take 5 mg by mouth daily.    08/24/2017 at 1000  . polyethylene glycol powder (GLYCOLAX/MIRALAX) powder Take 17 g by mouth daily.    08/24/2017 at 0800  . pravastatin (PRAVACHOL) 40 MG tablet Take 40 mg by mouth at bedtime.    08/23/2017 at 2000  . risperiDONE (RISPERDAL) 0.25 MG tablet Take 0.25 mg by mouth daily.   08/24/2017 at 1000  . risperiDONE (RISPERDAL) 0.5 MG tablet Take 0.5 mg by mouth at bedtime.    08/23/2017 at 2000  . rOPINIRole (REQUIP) 0.5 MG tablet Take 0.5 mg by mouth at bedtime.   08/23/2017 at 2000  . simethicone (MYLICON) 80 MG chewable tablet Chew 80 mg by mouth every 8 (eight) hours as needed for flatulence.   Taking  . traZODone (DESYREL) 50 MG tablet Take 25 mg by mouth at bedtime.   08/23/2017 at 2000  . trolamine salicylate (ASPERCREME) 10 % cream Apply 1 application topically as needed for muscle pain.   08/22/2017 at 1105  . umeclidinium-vilanterol (ANORO ELLIPTA) 62.5-25 MCG/INH AEPB Inhale 1 puff into the lungs daily.   08/24/2017 at 1000   Scheduled: . apixaban  2.5 mg Oral BID  . furosemide  40 mg Oral BID  . gabapentin  600 mg Oral QHS  . levothyroxine  88 mcg Oral QAC breakfast  . mouth rinse  15 mL Mouth Rinse BID  . NIFEdipine  30 mg Oral Daily  . oxybutynin  5 mg Oral Daily  . pantoprazole  40 mg Oral Daily  . polyethylene glycol  17 g Oral Daily  . pravastatin  40 mg Oral QHS  . risperiDONE  0.25 mg Oral Daily  . risperiDONE  0.5  mg Oral QHS  . rOPINIRole  0.5 mg Oral QHS  . sodium chloride flush  3 mL Intravenous Q12H  . traZODone  25 mg Oral QHS   Continuous: . sodium chloride Stopped (08/25/17 0143)   IDP:OEUMPN chloride, acetaminophen, albuterol, guaiFENesin-dextromethorphan, sodium chloride  flush  Assesment: She was admitted with symptomatic anemia presumably from chronic GI bleeding.  She is doing better.  Her hemoglobin level has stabilized.  She is chronically anticoagulated because of DVT and that has been resumed with no change in her hemoglobin level.  She is ready for discharge Principal Problem:   GIB (gastrointestinal bleeding) Active Problems:   Microcytic anemia   Anxiety   Chronic diastolic CHF (congestive heart failure) ([facility])   Chronic kidney disease (CKD), stage III (moderate) ([facility])   Symptomatic anemia   GI bleed   Iron deficiency anemia due to chronic blood loss   Anemia due to chronic blood loss    Plan: Discharge home today    LOS: 4 days   Antinio Sanderfer L 08/29/2017, 8:27 AM

## 2017-08-29 NOTE — Care Management Important Message (Signed)
Important Message  Patient Details  Name: Christine Hancock MRN: [id number] Date of Birth: 28-Dec-1917   Medicare Important Message Given:  Yes    Marcey Persad, Chauncey Reading, RN 08/29/2017, 9:18 AM

## 2017-08-29 NOTE — Discharge Summary (Signed)
Physician Discharge Summary  Patient ID: Christine Hancock MRN: [id number] DOB/AGE: September 08, 1917 84 y.o. Primary Care Physician:Valeta Paz, Percell Miller, MD Admit date: 08/24/2017 Discharge date: 08/29/2017    Discharge Diagnoses:   Principal Problem:   GIB (gastrointestinal bleeding) Active Problems:   Microcytic anemia   Anxiety   Chronic diastolic CHF (congestive heart failure) (HCC)   Chronic kidney disease (CKD), stage III (moderate) (HCC)   Symptomatic anemia   GI bleed   Iron deficiency anemia due to chronic blood loss   Anemia due to chronic blood loss   Allergies as of 08/29/2017   No Known Allergies     Medication List    TAKE these medications   acetaminophen 325 MG tablet Commonly known as:  TYLENOL Take 325 mg by mouth every 6 (six) hours as needed.   albuterol 108 (90 Base) MCG/ACT inhaler Commonly known as:  PROVENTIL HFA;VENTOLIN HFA Inhale 2 puffs into the lungs every 6 (six) hours as needed for wheezing or shortness of breath.   ANORO ELLIPTA 62.5-25 MCG/INH Aepb Generic drug:  umeclidinium-vilanterol Inhale 1 puff into the lungs daily.   apixaban 2.5 MG Tabs tablet Commonly known as:  ELIQUIS Take 1 tablet (2.5 mg total) by mouth 2 (two) times daily. What changed:  how much to take   carboxymethylcellulose 0.5 % Soln Commonly known as:  REFRESH PLUS Place 1 drop into both eyes 4 (four) times daily.   ferrous sulfate 325 (65 FE) MG tablet Take 1 tablet (325 mg total) by mouth daily.   furosemide 40 MG tablet Commonly known as:  LASIX Take 40 mg by mouth daily as needed.   furosemide 40 MG tablet Commonly known as:  LASIX Take 1 tablet (40 mg total) by mouth 2 (two) times daily.   gabapentin 300 MG capsule Commonly known as:  NEURONTIN Take 600 mg by mouth at bedtime.   ketoconazole 2 % shampoo Commonly known as:  NIZORAL Apply 1 application topically once a week. As needed for antifungal.   ketorolac 10 MG tablet Commonly known as:  TORADOL Take  10 mg by mouth every 6 (six) hours as needed.   levothyroxine 88 MCG tablet Commonly known as:  SYNTHROID, LEVOTHROID Take 88 mcg by mouth daily before breakfast.   LINZESS 290 MCG Caps capsule Generic drug:  linaclotide Take 290 mcg by mouth daily before breakfast.   neomycin-polymyxin-dexameth 0.1 % Oint Commonly known as:  MAXITROL Place 1 application into both eyes 3 (three) times daily.   NIFEdipine 30 MG 24 hr tablet Commonly known as:  PROCARDIA-XL/ADALAT CC Take 30 mg by mouth daily.   omeprazole 20 MG capsule Commonly known as:  PRILOSEC Take 20 mg by mouth 2 (two) times daily before a meal.   oxybutynin 5 MG tablet Commonly known as:  DITROPAN Take 5 mg by mouth daily.   polyethylene glycol powder powder Commonly known as:  GLYCOLAX/MIRALAX Take 17 g by mouth daily.   pravastatin 40 MG tablet Commonly known as:  PRAVACHOL Take 40 mg by mouth at bedtime.   risperiDONE 0.5 MG tablet Commonly known as:  RISPERDAL Take 0.5 mg by mouth at bedtime.   risperiDONE 0.25 MG tablet Commonly known as:  RISPERDAL Take 0.25 mg by mouth daily.   ROBITUSSIN DM PO Take by mouth as needed.   rOPINIRole 0.5 MG tablet Commonly known as:  REQUIP Take 0.5 mg by mouth at bedtime.   simethicone 80 MG chewable tablet Commonly known as:  MYLICON Chew 80 mg by  mouth every 8 (eight) hours as needed for flatulence.   traZODone 50 MG tablet Commonly known as:  DESYREL Take 25 mg by mouth at bedtime.   trolamine salicylate 10 % cream Commonly known as:  ASPERCREME Apply 1 application topically as needed for muscle pain.       Discharged Condition: Improved    Consults: Gastroenterology  Significant Diagnostic Studies: Dg Chest 1 View  Result Date: 08/24/2017 CLINICAL DATA:  84 year old female with confusion. EXAM: CHEST  1 VIEW COMPARISON:  Chest CT and radiographs 10/27/2016 and earlier. FINDINGS: AP view of the chest at 1501 hours. Stable lung volumes. Stable mild  cardiomegaly and mediastinal contours. Calcified aortic atherosclerosis. No pneumothorax, pulmonary edema, pleural effusion or acute pulmonary opacity identified. Negative visible bowel gas pattern. Cholecystectomy clips in the right upper quadrant. No acute osseous abnormality identified. IMPRESSION: No acute cardiopulmonary abnormality. Aortic Atherosclerosis (ICD10-I70.0). Electronically Signed   By: Genevie Ann M.D.   On: 08/24/2017 15:22   Ct Head Wo Contrast  Result Date: 08/24/2017 CLINICAL DATA:  Dizziness today EXAM: CT HEAD WITHOUT CONTRAST TECHNIQUE: Contiguous axial images were obtained from the base of the skull through the vertex without intravenous contrast. COMPARISON:  10/23/2011 FINDINGS: Brain: There is atrophy and chronic small vessel disease changes. No acute intracranial abnormality. Specifically, no hemorrhage, hydrocephalus, mass lesion, acute infarction, or significant intracranial injury. Vascular: No hyperdense vessel or unexpected calcification. Skull: No acute calvarial abnormality. Sinuses/Orbits: Visualized paranasal sinuses and mastoids clear. Orbital soft tissues unremarkable. Other: None IMPRESSION: No acute intracranial abnormality. Atrophy, chronic microvascular disease. Electronically Signed   By: Rolm Baptise M.D.   On: 08/24/2017 13:56   US Venous Img Upper Uni Right  Result Date: 08/24/2017 CLINICAL DATA:  Edema, pain.  History of lower extremity DVT. EXAM: RIGHT UPPER EXTREMITY VENOUS DOPPLER ULTRASOUND TECHNIQUE: Gray-scale sonography with graded compression, as well as color Doppler and duplex ultrasound were performed to evaluate the upper extremity deep venous system from the level of the subclavian vein and including the jugular, axillary, basilic and upper cephalic vein. Spectral Doppler was utilized to evaluate flow at rest and with distal augmentation maneuvers. COMPARISON:  None. FINDINGS: Thrombus within deep veins:  None visualized. Compressibility of deep veins:   Normal. Duplex waveform respiratory phasicity:  Normal. Duplex waveform response to augmentation:  Normal. Venous reflux:  None visualized. Other findings: There is segmental occlusive thrombus in the cephalic vein near the elbow. Cephalic vein is patent and compressible peripherally in the forearm and more centrally. Limited images of the contralateral subclavian vein unremarkable. IMPRESSION: 1. Negative for right upper extremity DVT. 2. Segmental superficial thrombophlebitis in the cephalic vein at the level of the elbow. Electronically Signed   By: Lucrezia Europe M.D.   On: 08/24/2017 15:34   Dg Hand Complete Right  Result Date: 08/24/2017 CLINICAL DATA:  84 year old female with pain and swelling in the hand with no known injury. EXAM: RIGHT HAND - COMPLETE 3+ VIEW COMPARISON:  [facility] right hand radiographs 08/15/2017. FINDINGS: Bone mineralization is stable and normal for age. Distal radius and ulna are stable and intact. Carpal bone alignment is stable and within normal limits. Metacarpals and phalanges are intact. Mild for age joint degeneration. No acute osseous abnormality identified. Dorsal soft tissue swelling at the level of the metacarpals has regressed compared to 08/15/2017. IMPRESSION: No acute osseous abnormality in the right hand. Electronically Signed   By: Genevie Ann M.D.   On: 08/24/2017 15:23    Lab  Results: Basic Metabolic Panel: Recent Labs    08/27/17 0638  NA 134*  K 4.3  CL 99  CO2 27  GLUCOSE 128*  BUN 32*  CREATININE 1.37*  CALCIUM 8.9   Liver Function Tests: No results for input(s): AST, ALT, ALKPHOS, BILITOT, PROT, ALBUMIN in the last 72 hours.   CBC: Recent Labs    08/27/17 0638 08/28/17 0709  WBC 7.0 7.6  NEUTROABS 4.6 5.3  HGB 9.8* 9.8*  HCT 33.1* 32.8*  MCV 79.6 79.4  PLT 361 344    Recent Results (from the past 240 hour(s))  MRSA PCR Screening     Status: None   Collection Time: 08/24/17  7:51 PM  Result Value Ref Range Status    MRSA by PCR NEGATIVE NEGATIVE Final    Comment:        The GeneXpert MRSA Assay (FDA approved for NASAL specimens only), is one component of a comprehensive MRSA colonization surveillance program. It is not intended to diagnose MRSA infection nor to guide or monitor treatment for MRSA infections. Performed at [facility], [address], [place],  [address]      Hospital Course: This is a 84 year old who came to the hospital because of weakness.  She was found to be anemic.  This appeared to be iron deficiency presumably from chronic GI bleeding.  She has been fully anticoagulated because of DVT which has been recurrent.  She is known to have heart failure.  She was transfused 2 units of packed red blood cells and improved.  GI consultation was obtained and she was offered EGD and colonoscopy which she elected not to pursue.  She is not having any overt bleeding now.  Her anticoagulation was held but has been resumed and she was observed to make sure that her hemoglobin level stayed about the same and it was stable.  She is ready for discharge on the day of discharge.  Discharge Exam: Blood pressure 122/70, pulse 94, temperature 98.3 F (36.8 C), temperature source Oral, resp. rate 20, height 5\' 2"  (1.575 m), weight 81 kg (178 lb 9.2 oz), SpO2 100 %. She is awake and alert.  Chest is clear.  Heart is regular.  Disposition: Back to assisted living facility      Signed: Milda Lindvall L   08/29/2017, 8:31 AM

## 2017-08-29 NOTE — Clinical Social Work Note (Signed)
Sherry at [facility] notified of discharge. Discharge clinicals to be sent upon attending signing FL2. Patient's niece, Shelton Silvas, notified of discharge. Facility to pick patient up. LCSW signing off.    Aamina Skiff, Clydene Pugh, LCSW

## 2017-08-29 NOTE — Progress Notes (Signed)
Patient discharged back to [facility], AVS sent with patient

## 2017-08-30 DIAGNOSIS — I739 Peripheral vascular disease, unspecified: Secondary | ICD-10-CM | POA: Diagnosis not present

## 2017-09-01 DIAGNOSIS — I5032 Chronic diastolic (congestive) heart failure: Secondary | ICD-10-CM | POA: Diagnosis not present

## 2017-09-01 DIAGNOSIS — I129 Hypertensive chronic kidney disease with stage 1 through stage 4 chronic kidney disease, or unspecified chronic kidney disease: Secondary | ICD-10-CM | POA: Diagnosis not present

## 2017-09-01 DIAGNOSIS — M6281 Muscle weakness (generalized): Secondary | ICD-10-CM | POA: Diagnosis not present

## 2017-09-01 DIAGNOSIS — Z9181 History of falling: Secondary | ICD-10-CM | POA: Diagnosis not present

## 2017-09-01 DIAGNOSIS — N183 Chronic kidney disease, stage 3 (moderate): Secondary | ICD-10-CM | POA: Diagnosis not present

## 2017-09-01 DIAGNOSIS — D509 Iron deficiency anemia, unspecified: Secondary | ICD-10-CM | POA: Diagnosis not present

## 2017-09-02 ENCOUNTER — Other Ambulatory Visit: Payer: Self-pay | Admitting: Cardiology

## 2017-09-07 DIAGNOSIS — M6281 Muscle weakness (generalized): Secondary | ICD-10-CM | POA: Diagnosis not present

## 2017-09-07 DIAGNOSIS — D509 Iron deficiency anemia, unspecified: Secondary | ICD-10-CM | POA: Diagnosis not present

## 2017-09-07 DIAGNOSIS — I5032 Chronic diastolic (congestive) heart failure: Secondary | ICD-10-CM | POA: Diagnosis not present

## 2017-09-07 DIAGNOSIS — N183 Chronic kidney disease, stage 3 (moderate): Secondary | ICD-10-CM | POA: Diagnosis not present

## 2017-09-07 DIAGNOSIS — I129 Hypertensive chronic kidney disease with stage 1 through stage 4 chronic kidney disease, or unspecified chronic kidney disease: Secondary | ICD-10-CM | POA: Diagnosis not present

## 2017-09-07 DIAGNOSIS — Z9181 History of falling: Secondary | ICD-10-CM | POA: Diagnosis not present

## 2017-09-09 DIAGNOSIS — D509 Iron deficiency anemia, unspecified: Secondary | ICD-10-CM | POA: Diagnosis not present

## 2017-09-09 DIAGNOSIS — N183 Chronic kidney disease, stage 3 (moderate): Secondary | ICD-10-CM | POA: Diagnosis not present

## 2017-09-09 DIAGNOSIS — I129 Hypertensive chronic kidney disease with stage 1 through stage 4 chronic kidney disease, or unspecified chronic kidney disease: Secondary | ICD-10-CM | POA: Diagnosis not present

## 2017-09-09 DIAGNOSIS — Z9181 History of falling: Secondary | ICD-10-CM | POA: Diagnosis not present

## 2017-09-09 DIAGNOSIS — I5032 Chronic diastolic (congestive) heart failure: Secondary | ICD-10-CM | POA: Diagnosis not present

## 2017-09-09 DIAGNOSIS — M6281 Muscle weakness (generalized): Secondary | ICD-10-CM | POA: Diagnosis not present

## 2017-09-13 DIAGNOSIS — M6281 Muscle weakness (generalized): Secondary | ICD-10-CM | POA: Diagnosis not present

## 2017-09-13 DIAGNOSIS — I129 Hypertensive chronic kidney disease with stage 1 through stage 4 chronic kidney disease, or unspecified chronic kidney disease: Secondary | ICD-10-CM | POA: Diagnosis not present

## 2017-09-13 DIAGNOSIS — I5032 Chronic diastolic (congestive) heart failure: Secondary | ICD-10-CM | POA: Diagnosis not present

## 2017-09-13 DIAGNOSIS — D509 Iron deficiency anemia, unspecified: Secondary | ICD-10-CM | POA: Diagnosis not present

## 2017-09-13 DIAGNOSIS — N183 Chronic kidney disease, stage 3 (moderate): Secondary | ICD-10-CM | POA: Diagnosis not present

## 2017-09-13 DIAGNOSIS — Z9181 History of falling: Secondary | ICD-10-CM | POA: Diagnosis not present

## 2017-09-15 DIAGNOSIS — I129 Hypertensive chronic kidney disease with stage 1 through stage 4 chronic kidney disease, or unspecified chronic kidney disease: Secondary | ICD-10-CM | POA: Diagnosis not present

## 2017-09-15 DIAGNOSIS — M6281 Muscle weakness (generalized): Secondary | ICD-10-CM | POA: Diagnosis not present

## 2017-09-15 DIAGNOSIS — Z9181 History of falling: Secondary | ICD-10-CM | POA: Diagnosis not present

## 2017-09-15 DIAGNOSIS — I5032 Chronic diastolic (congestive) heart failure: Secondary | ICD-10-CM | POA: Diagnosis not present

## 2017-09-15 DIAGNOSIS — D509 Iron deficiency anemia, unspecified: Secondary | ICD-10-CM | POA: Diagnosis not present

## 2017-09-15 DIAGNOSIS — N183 Chronic kidney disease, stage 3 (moderate): Secondary | ICD-10-CM | POA: Diagnosis not present

## 2017-09-20 DIAGNOSIS — M6281 Muscle weakness (generalized): Secondary | ICD-10-CM | POA: Diagnosis not present

## 2017-09-20 DIAGNOSIS — D509 Iron deficiency anemia, unspecified: Secondary | ICD-10-CM | POA: Diagnosis not present

## 2017-09-20 DIAGNOSIS — Z9181 History of falling: Secondary | ICD-10-CM | POA: Diagnosis not present

## 2017-09-20 DIAGNOSIS — I129 Hypertensive chronic kidney disease with stage 1 through stage 4 chronic kidney disease, or unspecified chronic kidney disease: Secondary | ICD-10-CM | POA: Diagnosis not present

## 2017-09-20 DIAGNOSIS — N183 Chronic kidney disease, stage 3 (moderate): Secondary | ICD-10-CM | POA: Diagnosis not present

## 2017-09-20 DIAGNOSIS — I5032 Chronic diastolic (congestive) heart failure: Secondary | ICD-10-CM | POA: Diagnosis not present

## 2017-09-27 DIAGNOSIS — N183 Chronic kidney disease, stage 3 (moderate): Secondary | ICD-10-CM | POA: Diagnosis not present

## 2017-09-27 DIAGNOSIS — Z9181 History of falling: Secondary | ICD-10-CM | POA: Diagnosis not present

## 2017-09-27 DIAGNOSIS — I129 Hypertensive chronic kidney disease with stage 1 through stage 4 chronic kidney disease, or unspecified chronic kidney disease: Secondary | ICD-10-CM | POA: Diagnosis not present

## 2017-09-27 DIAGNOSIS — D509 Iron deficiency anemia, unspecified: Secondary | ICD-10-CM | POA: Diagnosis not present

## 2017-09-27 DIAGNOSIS — I5032 Chronic diastolic (congestive) heart failure: Secondary | ICD-10-CM | POA: Diagnosis not present

## 2017-09-27 DIAGNOSIS — M6281 Muscle weakness (generalized): Secondary | ICD-10-CM | POA: Diagnosis not present

## 2017-09-29 DIAGNOSIS — M79671 Pain in right foot: Secondary | ICD-10-CM | POA: Diagnosis not present

## 2017-09-29 DIAGNOSIS — B351 Tinea unguium: Secondary | ICD-10-CM | POA: Diagnosis not present

## 2017-09-29 DIAGNOSIS — N183 Chronic kidney disease, stage 3 (moderate): Secondary | ICD-10-CM | POA: Diagnosis not present

## 2017-09-29 DIAGNOSIS — M79672 Pain in left foot: Secondary | ICD-10-CM | POA: Diagnosis not present

## 2017-09-29 DIAGNOSIS — M79674 Pain in right toe(s): Secondary | ICD-10-CM | POA: Diagnosis not present

## 2017-09-29 DIAGNOSIS — M6281 Muscle weakness (generalized): Secondary | ICD-10-CM | POA: Diagnosis not present

## 2017-09-29 DIAGNOSIS — Z9181 History of falling: Secondary | ICD-10-CM | POA: Diagnosis not present

## 2017-09-29 DIAGNOSIS — I129 Hypertensive chronic kidney disease with stage 1 through stage 4 chronic kidney disease, or unspecified chronic kidney disease: Secondary | ICD-10-CM | POA: Diagnosis not present

## 2017-09-29 DIAGNOSIS — D509 Iron deficiency anemia, unspecified: Secondary | ICD-10-CM | POA: Diagnosis not present

## 2017-09-29 DIAGNOSIS — I5032 Chronic diastolic (congestive) heart failure: Secondary | ICD-10-CM | POA: Diagnosis not present

## 2017-09-30 ENCOUNTER — Encounter (HOSPITAL_COMMUNITY): Payer: Self-pay | Admitting: *Deleted

## 2017-09-30 ENCOUNTER — Emergency Department (HOSPITAL_COMMUNITY)
Admission: EM | Admit: 2017-09-30 | Discharge: 2017-09-30 | Disposition: A | Discharge status: Skilled Nursing Facility | Payer: Medicare Other | Attending: Emergency Medicine | Admitting: Emergency Medicine

## 2017-09-30 ENCOUNTER — Emergency Department (HOSPITAL_COMMUNITY): Payer: Medicare Other

## 2017-09-30 ENCOUNTER — Other Ambulatory Visit: Payer: Self-pay

## 2017-09-30 DIAGNOSIS — E039 Hypothyroidism, unspecified: Secondary | ICD-10-CM | POA: Diagnosis not present

## 2017-09-30 DIAGNOSIS — R0602 Shortness of breath: Secondary | ICD-10-CM | POA: Diagnosis not present

## 2017-09-30 DIAGNOSIS — Z7901 Long term (current) use of anticoagulants: Secondary | ICD-10-CM | POA: Diagnosis not present

## 2017-09-30 DIAGNOSIS — R0689 Other abnormalities of breathing: Secondary | ICD-10-CM | POA: Diagnosis not present

## 2017-09-30 DIAGNOSIS — R609 Edema, unspecified: Secondary | ICD-10-CM | POA: Diagnosis not present

## 2017-09-30 DIAGNOSIS — I5032 Chronic diastolic (congestive) heart failure: Secondary | ICD-10-CM | POA: Insufficient documentation

## 2017-09-30 DIAGNOSIS — R0902 Hypoxemia: Secondary | ICD-10-CM | POA: Diagnosis not present

## 2017-09-30 DIAGNOSIS — D649 Anemia, unspecified: Secondary | ICD-10-CM | POA: Diagnosis not present

## 2017-09-30 DIAGNOSIS — I509 Heart failure, unspecified: Secondary | ICD-10-CM

## 2017-09-30 DIAGNOSIS — N183 Chronic kidney disease, stage 3 unspecified: Secondary | ICD-10-CM

## 2017-09-30 DIAGNOSIS — Z79899 Other long term (current) drug therapy: Secondary | ICD-10-CM | POA: Insufficient documentation

## 2017-09-30 DIAGNOSIS — R05 Cough: Secondary | ICD-10-CM | POA: Diagnosis present

## 2017-09-30 DIAGNOSIS — Z87891 Personal history of nicotine dependence: Secondary | ICD-10-CM | POA: Insufficient documentation

## 2017-09-30 DIAGNOSIS — I130 Hypertensive heart and chronic kidney disease with heart failure and stage 1 through stage 4 chronic kidney disease, or unspecified chronic kidney disease: Secondary | ICD-10-CM | POA: Diagnosis not present

## 2017-09-30 DIAGNOSIS — I4891 Unspecified atrial fibrillation: Secondary | ICD-10-CM | POA: Diagnosis not present

## 2017-09-30 LAB — CBC WITH DIFFERENTIAL/PLATELET
BASOS ABS: 0 10*3/uL (ref 0.0–0.1)
BASOS PCT: 0 %
EOS PCT: 3 %
Eosinophils Absolute: 0.2 10*3/uL (ref 0.0–0.7)
HCT: 29.9 % — ABNORMAL LOW (ref 36.0–46.0)
Hemoglobin: 9.4 g/dL — ABNORMAL LOW (ref 12.0–15.0)
Lymphocytes Relative: 26 %
Lymphs Abs: 1.5 10*3/uL (ref 0.7–4.0)
MCH: 25.4 pg — ABNORMAL LOW (ref 26.0–34.0)
MCHC: 31.4 g/dL (ref 30.0–36.0)
MCV: 80.8 fL (ref 78.0–100.0)
MONO ABS: 0.7 10*3/uL (ref 0.1–1.0)
Monocytes Relative: 11 %
Neutro Abs: 3.6 10*3/uL (ref 1.7–7.7)
Neutrophils Relative %: 60 %
PLATELETS: 266 10*3/uL (ref 150–400)
RBC: 3.7 MIL/uL — ABNORMAL LOW (ref 3.87–5.11)
RDW: 22.3 % — AB (ref 11.5–15.5)
WBC: 6 10*3/uL (ref 4.0–10.5)

## 2017-09-30 LAB — BASIC METABOLIC PANEL
ANION GAP: 8 (ref 5–15)
BUN: 24 mg/dL — ABNORMAL HIGH (ref 8–23)
CALCIUM: 8.6 mg/dL — AB (ref 8.9–10.3)
CO2: 26 mmol/L (ref 22–32)
Chloride: 101 mmol/L (ref 98–111)
Creatinine, Ser: 1.23 mg/dL — ABNORMAL HIGH (ref 0.44–1.00)
GFR, EST AFRICAN AMERICAN: 41 mL/min — AB (ref >60)
GFR, EST NON AFRICAN AMERICAN: 35 mL/min — AB (ref >60)
Glucose, Bld: 163 mg/dL — ABNORMAL HIGH (ref 70–99)
Potassium: 4 mmol/L (ref 3.5–5.1)
Sodium: 135 mmol/L (ref 135–145)

## 2017-09-30 LAB — I-STAT TROPONIN, ED: Troponin i, poc: 0.01 ng/mL (ref 0.00–0.08)

## 2017-09-30 LAB — URINALYSIS, ROUTINE W REFLEX MICROSCOPIC
Bilirubin Urine: NEGATIVE
GLUCOSE, UA: NEGATIVE mg/dL
HGB URINE DIPSTICK: NEGATIVE
KETONES UR: NEGATIVE mg/dL
LEUKOCYTES UA: NEGATIVE
Nitrite: NEGATIVE
PROTEIN: NEGATIVE mg/dL
Specific Gravity, Urine: 1.009 (ref 1.005–1.030)
pH: 6 (ref 5.0–8.0)

## 2017-09-30 LAB — BRAIN NATRIURETIC PEPTIDE: B NATRIURETIC PEPTIDE 5: 113 pg/mL — AB (ref 0.0–100.0)

## 2017-09-30 MED ORDER — FUROSEMIDE 10 MG/ML IJ SOLN
40.0000 mg | Freq: Once | INTRAMUSCULAR | Status: AC
  Administered 2017-09-30: 40 mg via INTRAVENOUS
  Filled 2017-09-30: qty 4

## 2017-09-30 NOTE — ED Provider Notes (Signed)
[facility] EMERGENCY DEPARTMENT Provider Note   CSN: [id number] Arrival date & time: 09/30/17  1516     History   Chief Complaint Chief Complaint  Patient presents with  . Cough    facility stated she needed to be evaluated because she was "full of fluid"    HPI Christine Hancock is a 84 y.o. female.  Pt presents to the ED today to be evaluated for fluid overload.  Pt does not know why she is here.  She said she feels fine.  She is normally on 2L oxygen via [place].  She denies any pain or new swelling.     Past Medical History:  Diagnosis Date  . Anxiety   . DVT, lower extremity, recurrent ([place])   . GERD (gastroesophageal reflux disease)   . Hypercholesterolemia   . Hypotension   . Hypothyroidism   . Phlebitis     Patient Active Problem List   Diagnosis Date Noted  . Anemia due to chronic blood loss 08/28/2017  . Iron deficiency anemia due to chronic blood loss   . GI bleed 08/25/2017  . GIB (gastrointestinal bleeding) 08/24/2017  . Chronic diastolic CHF (congestive heart failure) ([place]) 08/24/2017  . Chronic kidney disease (CKD), stage III (moderate) ([place]) 08/24/2017  . Symptomatic anemia   . Acute on chronic diastolic heart failure ([place]) 10/30/2016  . Acute lower UTI 10/27/2016  . AKI (acute kidney injury) ([place]) 10/27/2016  . Dehydration 10/24/2011  . Acute on chronic renal failure ([place]) 10/24/2011  . Failure to thrive in adult 10/24/2011  . Microcytic anemia 10/24/2011  . Anxiety 10/24/2011  . Hypothyroidism 10/24/2011    Past Surgical History:  Procedure Laterality Date  . ABDOMINAL HYSTERECTOMY     partial  . CATARACT EXTRACTION W/PHACO Right 06/28/2013   Procedure: CATARACT EXTRACTION PHACO AND INTRAOCULAR LENS PLACEMENT (IOC);  Surgeon: Tonny Branch, MD;  Location: [facility];  Service: Ophthalmology;  Laterality: Right;  CDE:13.79  . CATARACT EXTRACTION W/PHACO Left 07/26/2013   Procedure: CATARACT EXTRACTION PHACO AND INTRAOCULAR LENS PLACEMENT LEFT EYE;   Surgeon: Tonny Branch, MD;  Location: [facility];  Service: Ophthalmology;  Laterality: Left;  CDE: 11.76  . CHOLECYSTECTOMY    . ESOPHAGEAL DILATION    . ESOPHAGOGASTRODUODENOSCOPY  2013   Dr. Gala Romney: noncritical Schatzki's ring, scattered erosions, normal duodenum, path with moderate chronic atrophic gastritis  . TONSILLECTOMY       OB History   None      Home Medications    Prior to Admission medications   Medication Sig Start Date End Date Taking? Authorizing Provider  acetaminophen (TYLENOL) 325 MG tablet Take 650 mg by mouth every 6 (six) hours as needed for mild pain or moderate pain.    Yes [provider]  albuterol (PROVENTIL HFA;VENTOLIN HFA) 108 (90 Base) MCG/ACT inhaler Inhale 2 puffs into the lungs every 6 (six) hours as needed for wheezing or shortness of breath. 10/28/16  Yes Sinda Du, MD  apixaban (ELIQUIS) 2.5 MG TABS tablet Take 1 tablet (2.5 mg total) by mouth 2 (two) times daily. 08/29/17  Yes Sinda Du, MD  carboxymethylcellulose (REFRESH PLUS) 0.5 % SOLN Place 1 drop into both eyes 4 (four) times daily.   Yes [provider]  ferrous sulfate 325 (65 FE) MG tablet Take 1 tablet (325 mg total) by mouth daily. 08/29/17 08/29/18 Yes Sinda Du, MD  furosemide (LASIX) 40 MG tablet TAKE 1 TABLET BY MOUTH TWICE DAILY. Patient taking differently: Take 40 mg by mouth  2 (two) times daily. *May take one tablet as needed for increase in weight of 3 LBS overnight or 5 LBS increase in a week 09/02/17  Yes Branch, Alphonse Guild, MD  gabapentin (NEURONTIN) 300 MG capsule Take 600 mg by mouth at bedtime.   Yes [provider]  ketoconazole (NIZORAL) 2 % shampoo Apply 1 application topically See admin instructions. Once weekly As needed for antifungal.   Yes [provider]  ketorolac (TORADOL) 10 MG tablet Take 10 mg by mouth every 6 (six) hours as needed for moderate pain (to hand).    Yes [provider]  levothyroxine (SYNTHROID,  LEVOTHROID) 88 MCG tablet Take 88 mcg by mouth daily before breakfast.   Yes [provider]  LINZESS 290 MCG CAPS capsule Take 290 mcg by mouth daily before breakfast.  09/07/16  Yes [provider]  neomycin-polymyxin-dexameth (MAXITROL) 0.1 % OINT Place 1 application into both eyes 3 (three) times daily. For dry eyes   Yes [provider]  NIFEdipine (PROCARDIA-XL/ADALAT CC) 30 MG 24 hr tablet Take 30 mg by mouth daily.   Yes [provider]  omeprazole (PRILOSEC) 20 MG capsule Take 20 mg by mouth 2 (two) times daily before a meal.    Yes [provider]  oxybutynin (DITROPAN XL) 15 MG 24 hr tablet Take 15 mg by mouth daily.   Yes [provider]  polyethylene glycol powder (GLYCOLAX/MIRALAX) powder Take 8.6 g by mouth daily.  09/20/16  Yes [provider]  pravastatin (PRAVACHOL) 40 MG tablet Take 40 mg by mouth at bedtime.  09/07/16  Yes [provider]  risperiDONE (RISPERDAL) 0.25 MG tablet Take 0.25 mg by mouth daily.   Yes [provider]  rOPINIRole (REQUIP) 0.5 MG tablet Take 0.5 mg by mouth at bedtime.   Yes [provider]  silver sulfADIAZINE (SILVADENE) 1 % cream Apply 1 application topically daily. Left toe   Yes [provider]  traZODone (DESYREL) 50 MG tablet Take 25 mg by mouth at bedtime.   Yes [provider]  trolamine salicylate (ASPERCREME) 10 % cream Apply 1 application topically every 12 (twelve) hours as needed for muscle pain.    Yes [provider]  umeclidinium-vilanterol (ANORO ELLIPTA) 62.5-25 MCG/INH AEPB Inhale 1 puff into the lungs daily.   Yes [provider]    Family History Family History  Problem Relation Age of Onset  . Diabetes Mother   . Diabetes Brother   . Anemia Brother   . Anemia Sister   . Coronary artery disease Other     Social History Social History   Tobacco Use  . Smoking status: Former Smoker    Packs/day: 0.25     Years: 15.00    Pack years: 3.75    Types: Cigarettes    Last attempt to quit: 10/22/2005    Years since quitting: 11.9  . Smokeless tobacco: Never Used  Substance Use Topics  . Alcohol use: No  . Drug use: No     Allergies   Patient has no known allergies.   Review of Systems Review of Systems  Respiratory: Positive for shortness of breath.   All other systems reviewed and are negative.    Physical Exam Updated Vital Signs BP (!) 142/82   Pulse 94   Temp 97.9 F (36.6 C) (Oral)   Resp (!) 49   Ht 5\' 2"  (1.575 m)   Wt 83.9 kg   SpO2 95%   BMI 33.84  kg/m   Physical Exam  Constitutional: She is oriented to person, place, and time. She appears well-developed and well-nourished.  HENT:  Head: Normocephalic and atraumatic.  Right Ear: External ear normal.  Left Ear: External ear normal.  Nose: Nose normal.  Mouth/Throat: Oropharynx is clear and moist.  Eyes: Pupils are equal, round, and reactive to light. Conjunctivae and EOM are normal.  Neck: Normal range of motion. Neck supple.  Cardiovascular: Normal rate, regular rhythm, normal heart sounds and intact distal pulses.  Pulmonary/Chest: Tachypnea noted. She has rales.  Abdominal: Soft. Bowel sounds are normal.  Musculoskeletal: Normal range of motion.  Neurological: She is alert and oriented to person, place, and time.  Skin: Skin is warm. Capillary refill takes less than 2 seconds.  Psychiatric: She has a normal mood and affect. Her behavior is normal. Judgment and thought content normal.  Nursing note and vitals reviewed.    ED Treatments / Results  Labs (all labs ordered are listed, but only abnormal results are displayed) Labs Reviewed  BASIC METABOLIC PANEL - Abnormal; Notable for the following components:      Result Value   Glucose, Bld 163 (*)    BUN 24 (*)    Creatinine, Ser 1.23 (*)    Calcium 8.6 (*)    GFR calc non Af Amer 35 (*)    GFR calc Af Amer 41 (*)    All other components within  normal limits  BRAIN NATRIURETIC PEPTIDE - Abnormal; Notable for the following components:   B Natriuretic Peptide 113.0 (*)    All other components within normal limits  CBC WITH DIFFERENTIAL/PLATELET - Abnormal; Notable for the following components:   RBC 3.70 (*)    Hemoglobin 9.4 (*)    HCT 29.9 (*)    MCH 25.4 (*)    RDW 22.3 (*)    All other components within normal limits  URINALYSIS, ROUTINE W REFLEX MICROSCOPIC  I-STAT TROPONIN, ED    EKG EKG Interpretation  Date/Time:  Friday September 30 2017 15:26:24 EDT Ventricular Rate:  91 PR Interval:    QRS Duration: 126 QT Interval:  402 QTC Calculation: 495 R Axis:   -69 Text Interpretation:  Sinus rhythm Atrial premature complexes in couplets RBBB and LAFB Probable left ventricular hypertrophy Artifact in lead(s) I II aVR aVL aVF V1 V2 and baseline wander in lead(s) I V5 No significant change since last tracing Confirmed by Isla Pence [phone]) on 09/30/2017 4:44:36 PM   Radiology Dg Chest 2 View  Result Date: 09/30/2017 CLINICAL DATA:  Shortness of breath. EXAM: CHEST - 2 VIEW COMPARISON:  08/24/2017 and 05/30/2014 FINDINGS: There is mild chronic cardiomegaly. Slight pulmonary vascular congestion with mild bilateral interstitial pulmonary edema and small effusions. Chronic elevation of the lateral aspect of the right hemidiaphragm. Aortic atherosclerosis. Chronic accentuation of the upper thoracic kyphosis. IMPRESSION: Findings consistent with mild congestive heart failure. Electronically Signed   By: Lorriane Shire M.D.   On: 09/30/2017 17:04    Procedures Procedures (including critical care time)  Medications Ordered in ED Medications  furosemide (LASIX) injection 40 mg (40 mg Intravenous Given 09/30/17 1807)     Initial Impression / Assessment and Plan / ED Course  I have reviewed the triage vital signs and the nursing notes.  Pertinent labs & imaging results that were available during my care of the patient were  reviewed by me and considered in my medical decision making (see chart for details).    Labs are stable.  Pt  feels good.  O2 sats good on her normal 2 L.  She does have mild chf on cxr, so I will increase her lasix to 60 in the am and 40 in the pm.  F/u with pcp.  Return if worse.  Final Clinical Impressions(s) / ED Diagnoses   Final diagnoses:  Acute on chronic congestive heart failure, unspecified heart failure type (HCC)  Chronic anemia  CKD (chronic kidney disease) stage 3, GFR 30-59 ml/min [facility])    ED Discharge Orders    None       Isla Pence, MD 09/30/17 1944

## 2017-09-30 NOTE — ED Triage Notes (Signed)
Sent from facility to be evaluated for fluid overload

## 2017-09-30 NOTE — ED Notes (Signed)
Report given to Nicholaus Corolla, Child psychotherapist at [facility]. No RN to give report to per the [facility]

## 2017-09-30 NOTE — ED Notes (Signed)
[facility] at bedside for transport

## 2017-09-30 NOTE — Discharge Instructions (Addendum)
Increase lasix to 60 mg in the morning and 40 mg at night.

## 2017-10-04 DIAGNOSIS — N183 Chronic kidney disease, stage 3 (moderate): Secondary | ICD-10-CM | POA: Diagnosis not present

## 2017-10-04 DIAGNOSIS — D509 Iron deficiency anemia, unspecified: Secondary | ICD-10-CM | POA: Diagnosis not present

## 2017-10-04 DIAGNOSIS — I5032 Chronic diastolic (congestive) heart failure: Secondary | ICD-10-CM | POA: Diagnosis not present

## 2017-10-04 DIAGNOSIS — M6281 Muscle weakness (generalized): Secondary | ICD-10-CM | POA: Diagnosis not present

## 2017-10-04 DIAGNOSIS — Z9181 History of falling: Secondary | ICD-10-CM | POA: Diagnosis not present

## 2017-10-04 DIAGNOSIS — I129 Hypertensive chronic kidney disease with stage 1 through stage 4 chronic kidney disease, or unspecified chronic kidney disease: Secondary | ICD-10-CM | POA: Diagnosis not present

## 2017-10-06 DIAGNOSIS — I129 Hypertensive chronic kidney disease with stage 1 through stage 4 chronic kidney disease, or unspecified chronic kidney disease: Secondary | ICD-10-CM | POA: Diagnosis not present

## 2017-10-06 DIAGNOSIS — Z9181 History of falling: Secondary | ICD-10-CM | POA: Diagnosis not present

## 2017-10-06 DIAGNOSIS — I5032 Chronic diastolic (congestive) heart failure: Secondary | ICD-10-CM | POA: Diagnosis not present

## 2017-10-06 DIAGNOSIS — M6281 Muscle weakness (generalized): Secondary | ICD-10-CM | POA: Diagnosis not present

## 2017-10-06 DIAGNOSIS — D509 Iron deficiency anemia, unspecified: Secondary | ICD-10-CM | POA: Diagnosis not present

## 2017-10-06 DIAGNOSIS — N183 Chronic kidney disease, stage 3 (moderate): Secondary | ICD-10-CM | POA: Diagnosis not present

## 2017-10-12 DIAGNOSIS — D509 Iron deficiency anemia, unspecified: Secondary | ICD-10-CM | POA: Diagnosis not present

## 2017-10-12 DIAGNOSIS — N183 Chronic kidney disease, stage 3 (moderate): Secondary | ICD-10-CM | POA: Diagnosis not present

## 2017-10-12 DIAGNOSIS — M6281 Muscle weakness (generalized): Secondary | ICD-10-CM | POA: Diagnosis not present

## 2017-10-12 DIAGNOSIS — Z9181 History of falling: Secondary | ICD-10-CM | POA: Diagnosis not present

## 2017-10-12 DIAGNOSIS — I5032 Chronic diastolic (congestive) heart failure: Secondary | ICD-10-CM | POA: Diagnosis not present

## 2017-10-12 DIAGNOSIS — I129 Hypertensive chronic kidney disease with stage 1 through stage 4 chronic kidney disease, or unspecified chronic kidney disease: Secondary | ICD-10-CM | POA: Diagnosis not present

## 2017-11-08 DIAGNOSIS — I739 Peripheral vascular disease, unspecified: Secondary | ICD-10-CM | POA: Diagnosis not present

## 2017-11-10 DIAGNOSIS — M13849 Other specified arthritis, unspecified hand: Secondary | ICD-10-CM | POA: Diagnosis not present

## 2017-11-10 DIAGNOSIS — F419 Anxiety disorder, unspecified: Secondary | ICD-10-CM | POA: Diagnosis not present

## 2017-11-10 DIAGNOSIS — J9611 Chronic respiratory failure with hypoxia: Secondary | ICD-10-CM | POA: Diagnosis not present

## 2017-11-10 DIAGNOSIS — I5032 Chronic diastolic (congestive) heart failure: Secondary | ICD-10-CM | POA: Diagnosis not present

## 2017-12-02 DIAGNOSIS — R32 Unspecified urinary incontinence: Secondary | ICD-10-CM | POA: Diagnosis not present

## 2017-12-02 DIAGNOSIS — K219 Gastro-esophageal reflux disease without esophagitis: Secondary | ICD-10-CM | POA: Diagnosis not present

## 2017-12-02 DIAGNOSIS — E039 Hypothyroidism, unspecified: Secondary | ICD-10-CM | POA: Diagnosis not present

## 2017-12-02 DIAGNOSIS — E785 Hyperlipidemia, unspecified: Secondary | ICD-10-CM | POA: Diagnosis not present

## 2017-12-02 DIAGNOSIS — Z79890 Hormone replacement therapy (postmenopausal): Secondary | ICD-10-CM | POA: Diagnosis not present

## 2017-12-02 DIAGNOSIS — I10 Essential (primary) hypertension: Secondary | ICD-10-CM | POA: Diagnosis not present

## 2017-12-02 DIAGNOSIS — N390 Urinary tract infection, site not specified: Secondary | ICD-10-CM | POA: Diagnosis not present

## 2017-12-02 DIAGNOSIS — Z79899 Other long term (current) drug therapy: Secondary | ICD-10-CM | POA: Diagnosis not present

## 2017-12-02 DIAGNOSIS — T300 Burn of unspecified body region, unspecified degree: Secondary | ICD-10-CM | POA: Diagnosis not present

## 2017-12-03 DIAGNOSIS — R5381 Other malaise: Secondary | ICD-10-CM | POA: Diagnosis not present

## 2017-12-03 DIAGNOSIS — Z7401 Bed confinement status: Secondary | ICD-10-CM | POA: Diagnosis not present

## 2017-12-05 ENCOUNTER — Emergency Department (HOSPITAL_COMMUNITY): Payer: Medicare Other

## 2017-12-05 ENCOUNTER — Inpatient Hospital Stay (HOSPITAL_COMMUNITY): Payer: Medicare Other

## 2017-12-05 ENCOUNTER — Inpatient Hospital Stay (HOSPITAL_COMMUNITY)
Admission: EM | Admit: 2017-12-05 | Discharge: 2017-12-13 | DRG: 637 | Disposition: A | Discharge status: Home Health | Payer: Medicare Other | Attending: Internal Medicine | Admitting: Internal Medicine

## 2017-12-05 ENCOUNTER — Other Ambulatory Visit: Payer: Self-pay

## 2017-12-05 ENCOUNTER — Encounter (HOSPITAL_COMMUNITY): Payer: Self-pay | Admitting: Emergency Medicine

## 2017-12-05 DIAGNOSIS — I5032 Chronic diastolic (congestive) heart failure: Secondary | ICD-10-CM | POA: Diagnosis not present

## 2017-12-05 DIAGNOSIS — R0902 Hypoxemia: Secondary | ICD-10-CM | POA: Diagnosis not present

## 2017-12-05 DIAGNOSIS — Z9841 Cataract extraction status, right eye: Secondary | ICD-10-CM | POA: Diagnosis not present

## 2017-12-05 DIAGNOSIS — R531 Weakness: Secondary | ICD-10-CM | POA: Diagnosis not present

## 2017-12-05 DIAGNOSIS — J90 Pleural effusion, not elsewhere classified: Secondary | ICD-10-CM | POA: Diagnosis not present

## 2017-12-05 DIAGNOSIS — Z6831 Body mass index (BMI) 31.0-31.9, adult: Secondary | ICD-10-CM

## 2017-12-05 DIAGNOSIS — Z87891 Personal history of nicotine dependence: Secondary | ICD-10-CM | POA: Diagnosis not present

## 2017-12-05 DIAGNOSIS — I5033 Acute on chronic diastolic (congestive) heart failure: Secondary | ICD-10-CM

## 2017-12-05 DIAGNOSIS — R4020 Unspecified coma: Secondary | ICD-10-CM | POA: Diagnosis not present

## 2017-12-05 DIAGNOSIS — N183 Chronic kidney disease, stage 3 unspecified: Secondary | ICD-10-CM

## 2017-12-05 DIAGNOSIS — D649 Anemia, unspecified: Secondary | ICD-10-CM

## 2017-12-05 DIAGNOSIS — R739 Hyperglycemia, unspecified: Secondary | ICD-10-CM

## 2017-12-05 DIAGNOSIS — E1122 Type 2 diabetes mellitus with diabetic chronic kidney disease: Secondary | ICD-10-CM | POA: Diagnosis present

## 2017-12-05 DIAGNOSIS — K5730 Diverticulosis of large intestine without perforation or abscess without bleeding: Secondary | ICD-10-CM | POA: Diagnosis not present

## 2017-12-05 DIAGNOSIS — N170 Acute kidney failure with tubular necrosis: Secondary | ICD-10-CM

## 2017-12-05 DIAGNOSIS — N181 Chronic kidney disease, stage 1: Secondary | ICD-10-CM

## 2017-12-05 DIAGNOSIS — R680 Hypothermia, not associated with low environmental temperature: Secondary | ICD-10-CM | POA: Diagnosis present

## 2017-12-05 DIAGNOSIS — Z66 Do not resuscitate: Secondary | ICD-10-CM | POA: Diagnosis present

## 2017-12-05 DIAGNOSIS — J449 Chronic obstructive pulmonary disease, unspecified: Secondary | ICD-10-CM | POA: Diagnosis not present

## 2017-12-05 DIAGNOSIS — Z7401 Bed confinement status: Secondary | ICD-10-CM | POA: Diagnosis not present

## 2017-12-05 DIAGNOSIS — R1312 Dysphagia, oropharyngeal phase: Secondary | ICD-10-CM | POA: Diagnosis not present

## 2017-12-05 DIAGNOSIS — K2100 Gastro-esophageal reflux disease with esophagitis, without bleeding: Secondary | ICD-10-CM

## 2017-12-05 DIAGNOSIS — Z79890 Hormone replacement therapy (postmenopausal): Secondary | ICD-10-CM | POA: Diagnosis not present

## 2017-12-05 DIAGNOSIS — E1165 Type 2 diabetes mellitus with hyperglycemia: Secondary | ICD-10-CM | POA: Diagnosis not present

## 2017-12-05 DIAGNOSIS — E1151 Type 2 diabetes mellitus with diabetic peripheral angiopathy without gangrene: Secondary | ICD-10-CM | POA: Diagnosis present

## 2017-12-05 DIAGNOSIS — J181 Lobar pneumonia, unspecified organism: Secondary | ICD-10-CM | POA: Diagnosis present

## 2017-12-05 DIAGNOSIS — F419 Anxiety disorder, unspecified: Secondary | ICD-10-CM | POA: Diagnosis present

## 2017-12-05 DIAGNOSIS — R627 Adult failure to thrive: Secondary | ICD-10-CM | POA: Diagnosis present

## 2017-12-05 DIAGNOSIS — E039 Hypothyroidism, unspecified: Secondary | ICD-10-CM | POA: Diagnosis present

## 2017-12-05 DIAGNOSIS — K264 Chronic or unspecified duodenal ulcer with hemorrhage: Secondary | ICD-10-CM

## 2017-12-05 DIAGNOSIS — R4182 Altered mental status, unspecified: Secondary | ICD-10-CM | POA: Diagnosis not present

## 2017-12-05 DIAGNOSIS — E1101 Type 2 diabetes mellitus with hyperosmolarity with coma: Secondary | ICD-10-CM | POA: Diagnosis present

## 2017-12-05 DIAGNOSIS — N390 Urinary tract infection, site not specified: Secondary | ICD-10-CM | POA: Diagnosis not present

## 2017-12-05 DIAGNOSIS — E7800 Pure hypercholesterolemia, unspecified: Secondary | ICD-10-CM | POA: Diagnosis present

## 2017-12-05 DIAGNOSIS — M6281 Muscle weakness (generalized): Secondary | ICD-10-CM | POA: Diagnosis not present

## 2017-12-05 DIAGNOSIS — Z79899 Other long term (current) drug therapy: Secondary | ICD-10-CM

## 2017-12-05 DIAGNOSIS — J9811 Atelectasis: Secondary | ICD-10-CM | POA: Diagnosis not present

## 2017-12-05 DIAGNOSIS — E860 Dehydration: Secondary | ICD-10-CM | POA: Diagnosis present

## 2017-12-05 DIAGNOSIS — R41841 Cognitive communication deficit: Secondary | ICD-10-CM | POA: Diagnosis not present

## 2017-12-05 DIAGNOSIS — Z833 Family history of diabetes mellitus: Secondary | ICD-10-CM

## 2017-12-05 DIAGNOSIS — N281 Cyst of kidney, acquired: Secondary | ICD-10-CM | POA: Diagnosis not present

## 2017-12-05 DIAGNOSIS — E02 Subclinical iodine-deficiency hypothyroidism: Secondary | ICD-10-CM

## 2017-12-05 DIAGNOSIS — E038 Other specified hypothyroidism: Secondary | ICD-10-CM | POA: Diagnosis not present

## 2017-12-05 DIAGNOSIS — Z7901 Long term (current) use of anticoagulants: Secondary | ICD-10-CM | POA: Diagnosis not present

## 2017-12-05 DIAGNOSIS — T68XXXA Hypothermia, initial encounter: Secondary | ICD-10-CM | POA: Diagnosis present

## 2017-12-05 DIAGNOSIS — D500 Iron deficiency anemia secondary to blood loss (chronic): Secondary | ICD-10-CM

## 2017-12-05 DIAGNOSIS — J189 Pneumonia, unspecified organism: Secondary | ICD-10-CM | POA: Diagnosis not present

## 2017-12-05 DIAGNOSIS — N179 Acute kidney failure, unspecified: Secondary | ICD-10-CM | POA: Diagnosis present

## 2017-12-05 DIAGNOSIS — I82599 Chronic embolism and thrombosis of other specified deep vein of unspecified lower extremity: Secondary | ICD-10-CM | POA: Diagnosis not present

## 2017-12-05 DIAGNOSIS — R5381 Other malaise: Secondary | ICD-10-CM | POA: Diagnosis not present

## 2017-12-05 DIAGNOSIS — K219 Gastro-esophageal reflux disease without esophagitis: Secondary | ICD-10-CM | POA: Diagnosis present

## 2017-12-05 DIAGNOSIS — B961 Klebsiella pneumoniae [K. pneumoniae] as the cause of diseases classified elsewhere: Secondary | ICD-10-CM | POA: Diagnosis not present

## 2017-12-05 DIAGNOSIS — I129 Hypertensive chronic kidney disease with stage 1 through stage 4 chronic kidney disease, or unspecified chronic kidney disease: Secondary | ICD-10-CM | POA: Diagnosis not present

## 2017-12-05 DIAGNOSIS — Z86718 Personal history of other venous thrombosis and embolism: Secondary | ICD-10-CM

## 2017-12-05 DIAGNOSIS — Z9842 Cataract extraction status, left eye: Secondary | ICD-10-CM

## 2017-12-05 DIAGNOSIS — Z961 Presence of intraocular lens: Secondary | ICD-10-CM | POA: Diagnosis present

## 2017-12-05 DIAGNOSIS — Z9981 Dependence on supplemental oxygen: Secondary | ICD-10-CM

## 2017-12-05 HISTORY — DX: Type 2 diabetes mellitus with hyperosmolarity with coma: E11.01

## 2017-12-05 HISTORY — DX: Chronic kidney disease, stage 1: N18.1

## 2017-12-05 HISTORY — DX: Gastrointestinal hemorrhage, unspecified: K92.2

## 2017-12-05 HISTORY — DX: Pure hypercholesterolemia, unspecified: E78.00

## 2017-12-05 HISTORY — DX: Anemia, unspecified: D64.9

## 2017-12-05 LAB — URINALYSIS, ROUTINE W REFLEX MICROSCOPIC
Bacteria, UA: NONE SEEN
Bilirubin Urine: NEGATIVE
Ketones, ur: NEGATIVE mg/dL
Leukocytes, UA: NEGATIVE
Nitrite: NEGATIVE
Protein, ur: NEGATIVE mg/dL
Specific Gravity, Urine: 1.021 (ref 1.005–1.030)
pH: 6 (ref 5.0–8.0)

## 2017-12-05 LAB — HEMOGLOBIN A1C
HEMOGLOBIN A1C: 12.7 % — AB (ref 4.8–5.6)
MEAN PLASMA GLUCOSE: 317.79 mg/dL

## 2017-12-05 LAB — COMPREHENSIVE METABOLIC PANEL
ALK PHOS: 166 U/L — AB (ref 38–126)
ALT: 21 U/L (ref 0–44)
AST: 24 U/L (ref 15–41)
Albumin: 3.3 g/dL — ABNORMAL LOW (ref 3.5–5.0)
Anion gap: 10 (ref 5–15)
BUN: 34 mg/dL — AB (ref 8–23)
CHLORIDE: 92 mmol/L — AB (ref 98–111)
CO2: 27 mmol/L (ref 22–32)
CREATININE: 1.62 mg/dL — AB (ref 0.44–1.00)
Calcium: 8.8 mg/dL — ABNORMAL LOW (ref 8.9–10.3)
GFR calc Af Amer: 29 mL/min — ABNORMAL LOW (ref >60)
GFR calc non Af Amer: 25 mL/min — ABNORMAL LOW (ref >60)
Glucose, Bld: 621 mg/dL (ref 70–99)
Potassium: 3.9 mmol/L (ref 3.5–5.1)
SODIUM: 129 mmol/L — AB (ref 135–145)
Total Bilirubin: 0.7 mg/dL (ref 0.3–1.2)
Total Protein: 7.7 g/dL (ref 6.5–8.1)

## 2017-12-05 LAB — LIPASE, BLOOD: Lipase: 28 U/L (ref 11–51)

## 2017-12-05 LAB — CBG MONITORING, ED
GLUCOSE-CAPILLARY: 564 mg/dL — AB (ref 70–99)
Glucose-Capillary: >600 mg/dL (ref 70–99)

## 2017-12-05 LAB — CBC WITH DIFFERENTIAL/PLATELET
ABS IMMATURE GRANULOCYTES: 0.03 10*3/uL (ref 0.00–0.07)
BASOS PCT: 0 %
Basophils Absolute: 0 10*3/uL (ref 0.0–0.1)
Eosinophils Absolute: 0.1 10*3/uL (ref 0.0–0.5)
Eosinophils Relative: 1 %
HCT: 33.9 % — ABNORMAL LOW (ref 36.0–46.0)
HEMOGLOBIN: 11 g/dL — AB (ref 12.0–15.0)
IMMATURE GRANULOCYTES: 1 %
LYMPHS PCT: 22 %
Lymphs Abs: 1.1 10*3/uL (ref 0.7–4.0)
MCH: 27.8 pg (ref 26.0–34.0)
MCHC: 32.4 g/dL (ref 30.0–36.0)
MCV: 85.6 fL (ref 80.0–100.0)
MONOS PCT: 11 %
Monocytes Absolute: 0.5 10*3/uL (ref 0.1–1.0)
NEUTROS ABS: 3.2 10*3/uL (ref 1.7–7.7)
Neutrophils Relative %: 65 %
PLATELETS: 212 10*3/uL (ref 150–400)
RBC: 3.96 MIL/uL (ref 3.87–5.11)
RDW: 16.1 % — ABNORMAL HIGH (ref 11.5–15.5)
WBC: 4.9 10*3/uL (ref 4.0–10.5)
nRBC: 0 % (ref 0.0–0.2)

## 2017-12-05 LAB — GLUCOSE, CAPILLARY
GLUCOSE-CAPILLARY: 466 mg/dL — AB (ref 70–99)
GLUCOSE-CAPILLARY: 285 mg/dL — AB (ref 70–99)
Glucose-Capillary: 420 mg/dL — ABNORMAL HIGH (ref 70–99)
Glucose-Capillary: 193 mg/dL — ABNORMAL HIGH (ref 70–99)
Glucose-Capillary: 112 mg/dL — ABNORMAL HIGH (ref 70–99)
Glucose-Capillary: 100 mg/dL — ABNORMAL HIGH (ref 70–99)
Glucose-Capillary: 114 mg/dL — ABNORMAL HIGH (ref 70–99)

## 2017-12-05 LAB — BLOOD GAS, VENOUS
ACID-BASE EXCESS: 0.6 mmol/L (ref 0.0–2.0)
BICARBONATE: 24.1 mmol/L (ref 20.0–28.0)
O2 Saturation: 70.8 %
PH VEN: 7.35 (ref 7.250–7.430)
PO2 VEN: 42 mmHg (ref 32.0–45.0)
pCO2, Ven: 47.4 mmHg (ref 44.0–60.0)

## 2017-12-05 LAB — I-STAT CG4 LACTIC ACID, ED: Lactic Acid, Venous: 0.8 mmol/L (ref 0.5–1.9)

## 2017-12-05 LAB — MRSA PCR SCREENING: MRSA by PCR: NEGATIVE

## 2017-12-05 LAB — T4, FREE: Free T4: 1.11 ng/dL (ref 0.82–1.77)

## 2017-12-05 LAB — TSH: TSH: 13.742 u[IU]/mL — ABNORMAL HIGH (ref 0.350–4.500)

## 2017-12-05 MED ORDER — NIFEDIPINE ER OSMOTIC RELEASE 30 MG PO TB24
30.0000 mg | ORAL_TABLET | Freq: Every day | ORAL | Status: DC
  Administered 2017-12-06 – 2017-12-13 (×8): 30 mg via ORAL
  Filled 2017-12-05 (×8): qty 1

## 2017-12-05 MED ORDER — RISPERIDONE 0.5 MG PO TABS
0.5000 mg | ORAL_TABLET | Freq: Every day | ORAL | Status: DC
  Administered 2017-12-05 – 2017-12-12 (×8): 0.5 mg via ORAL
  Filled 2017-12-05 (×8): qty 1

## 2017-12-05 MED ORDER — IOPAMIDOL (ISOVUE-300) INJECTION 61%: 30.0000 mL | Freq: Once | INTRAVENOUS | Status: DC | PRN

## 2017-12-05 MED ORDER — INSULIN ASPART 100 UNIT/ML ~~LOC~~ SOLN
0.0000 [IU] | Freq: Every day | SUBCUTANEOUS | Status: DC
  Administered 2017-12-06 – 2017-12-07 (×2): 3 [IU] via SUBCUTANEOUS

## 2017-12-05 MED ORDER — FERROUS SULFATE 325 (65 FE) MG PO TABS
325.0000 mg | ORAL_TABLET | Freq: Every day | ORAL | Status: DC
  Administered 2017-12-06 – 2017-12-13 (×8): 325 mg via ORAL
  Filled 2017-12-05 (×8): qty 1

## 2017-12-05 MED ORDER — POLYVINYL ALCOHOL 1.4 % OP SOLN: 1.0000 [drp] | OPHTHALMIC | Status: DC | PRN

## 2017-12-05 MED ORDER — POTASSIUM CHLORIDE IN NACL 40-0.9 MEQ/L-% IV SOLN
INTRAVENOUS | Status: DC
  Administered 2017-12-06: 50 mL/h via INTRAVENOUS
  Administered 2017-12-07: 35 mL/h via INTRAVENOUS

## 2017-12-05 MED ORDER — INSULIN GLARGINE 100 UNIT/ML ~~LOC~~ SOLN
14.0000 [IU] | Freq: Every day | SUBCUTANEOUS | Status: DC
  Administered 2017-12-05 – 2017-12-07 (×3): 14 [IU] via SUBCUTANEOUS
  Filled 2017-12-05 (×5): qty 0.14

## 2017-12-05 MED ORDER — VANCOMYCIN HCL IN DEXTROSE 1-5 GM/200ML-% IV SOLN
1000.0000 mg | Freq: Once | INTRAVENOUS | Status: AC
  Administered 2017-12-05: 1000 mg via INTRAVENOUS
  Filled 2017-12-05: qty 200

## 2017-12-05 MED ORDER — PIPERACILLIN-TAZOBACTAM 3.375 G IVPB 30 MIN
3.3750 g | Freq: Once | INTRAVENOUS | Status: AC
  Administered 2017-12-05: 3.375 g via INTRAVENOUS
  Filled 2017-12-05: qty 50

## 2017-12-05 MED ORDER — INSULIN REGULAR(HUMAN) IN NACL 100-0.9 UT/100ML-% IV SOLN
INTRAVENOUS | Status: DC
  Administered 2017-12-05: 4.1 [IU] via INTRAVENOUS
  Filled 2017-12-05 (×2): qty 100

## 2017-12-05 MED ORDER — FAMOTIDINE IN NACL 20-0.9 MG/50ML-% IV SOLN
20.0000 mg | INTRAVENOUS | Status: DC
  Administered 2017-12-05 – 2017-12-12 (×8): 20 mg via INTRAVENOUS
  Filled 2017-12-05 (×8): qty 50

## 2017-12-05 MED ORDER — APIXABAN 2.5 MG PO TABS
2.5000 mg | ORAL_TABLET | Freq: Two times a day (BID) | ORAL | Status: DC
  Administered 2017-12-05 – 2017-12-13 (×16): 2.5 mg via ORAL
  Filled 2017-12-05 (×16): qty 1

## 2017-12-05 MED ORDER — GABAPENTIN 300 MG PO CAPS
600.0000 mg | ORAL_CAPSULE | Freq: Every day | ORAL | Status: DC
  Administered 2017-12-05 – 2017-12-12 (×8): 600 mg via ORAL
  Filled 2017-12-05 (×8): qty 2

## 2017-12-05 MED ORDER — POLYVINYL ALCOHOL 1.4 % OP SOLN: 1.0000 [drp] | Freq: Four times a day (QID) | OPHTHALMIC | Status: DC

## 2017-12-05 MED ORDER — NEOMYCIN-POLYMYXIN-DEXAMETH 0.1 % OP OINT: 1.0000 "application " | TOPICAL_OINTMENT | Freq: Three times a day (TID) | OPHTHALMIC | Status: DC

## 2017-12-05 MED ORDER — UMECLIDINIUM-VILANTEROL 62.5-25 MCG/INH IN AEPB
1.0000 | INHALATION_SPRAY | Freq: Every day | RESPIRATORY_TRACT | Status: DC
  Administered 2017-12-05 – 2017-12-13 (×9): 1 via RESPIRATORY_TRACT
  Filled 2017-12-05: qty 14

## 2017-12-05 MED ORDER — LEVOTHYROXINE SODIUM 88 MCG PO TABS
88.0000 ug | ORAL_TABLET | Freq: Every day | ORAL | Status: DC
  Administered 2017-12-06 – 2017-12-13 (×8): 88 ug via ORAL
  Filled 2017-12-05 (×8): qty 1

## 2017-12-05 MED ORDER — LINACLOTIDE 145 MCG PO CAPS
290.0000 ug | ORAL_CAPSULE | Freq: Every day | ORAL | Status: DC
  Administered 2017-12-06 – 2017-12-13 (×8): 290 ug via ORAL
  Filled 2017-12-05 (×8): qty 2

## 2017-12-05 MED ORDER — PRAVASTATIN SODIUM 40 MG PO TABS
40.0000 mg | ORAL_TABLET | Freq: Every day | ORAL | Status: DC
  Administered 2017-12-06 – 2017-12-12 (×7): 40 mg via ORAL
  Filled 2017-12-05 (×7): qty 1

## 2017-12-05 MED ORDER — ONDANSETRON HCL 4 MG/2ML IJ SOLN: 4.0000 mg | Freq: Four times a day (QID) | INTRAMUSCULAR | Status: DC | PRN

## 2017-12-05 MED ORDER — ONDANSETRON HCL 4 MG PO TABS: 4.0000 mg | ORAL_TABLET | Freq: Four times a day (QID) | ORAL | Status: DC | PRN

## 2017-12-05 MED ORDER — SODIUM CHLORIDE 0.9 % IV SOLN
10.0000 mg | INTRAVENOUS | Status: DC
  Filled 2017-12-05 (×2): qty 1

## 2017-12-05 MED ORDER — PIPERACILLIN-TAZOBACTAM 3.375 G IVPB
3.3750 g | Freq: Two times a day (BID) | INTRAVENOUS | Status: DC
  Administered 2017-12-06: 3.375 g via INTRAVENOUS
  Filled 2017-12-05: qty 50

## 2017-12-05 MED ORDER — INSULIN ASPART 100 UNIT/ML ~~LOC~~ SOLN
0.0000 [IU] | Freq: Three times a day (TID) | SUBCUTANEOUS | Status: DC
  Administered 2017-12-06: 5 [IU] via SUBCUTANEOUS
  Administered 2017-12-06: 9 [IU] via SUBCUTANEOUS
  Administered 2017-12-06: 2 [IU] via SUBCUTANEOUS
  Administered 2017-12-07: 7 [IU] via SUBCUTANEOUS
  Administered 2017-12-07: 5 [IU] via SUBCUTANEOUS
  Administered 2017-12-07: 2 [IU] via SUBCUTANEOUS

## 2017-12-05 MED ORDER — SODIUM CHLORIDE 0.9 % IV BOLUS
500.0000 mL | Freq: Once | INTRAVENOUS | Status: AC
  Administered 2017-12-05: 500 mL via INTRAVENOUS

## 2017-12-05 MED ORDER — POLYETHYLENE GLYCOL 3350 17 G PO PACK
17.0000 g | PACK | Freq: Every day | ORAL | Status: DC
  Administered 2017-12-06 – 2017-12-12 (×7): 17 g via ORAL
  Filled 2017-12-05 (×8): qty 1

## 2017-12-05 MED ORDER — ACETAMINOPHEN 325 MG PO TABS: 650.0000 mg | ORAL_TABLET | Freq: Four times a day (QID) | ORAL | Status: DC | PRN

## 2017-12-05 MED ORDER — POTASSIUM CHLORIDE IN NACL 40-0.9 MEQ/L-% IV SOLN
Freq: Once | INTRAVENOUS | Status: AC
  Administered 2017-12-05: 250 mL/h via INTRAVENOUS

## 2017-12-05 MED ORDER — TRAZODONE HCL 50 MG PO TABS
25.0000 mg | ORAL_TABLET | Freq: Every day | ORAL | Status: DC
  Administered 2017-12-05 – 2017-12-12 (×8): 25 mg via ORAL
  Filled 2017-12-05 (×8): qty 1

## 2017-12-05 MED ORDER — ACETAMINOPHEN 650 MG RE SUPP: 650.0000 mg | Freq: Four times a day (QID) | RECTAL | Status: DC | PRN

## 2017-12-05 MED ORDER — OXYBUTYNIN CHLORIDE ER 5 MG PO TB24
15.0000 mg | ORAL_TABLET | Freq: Every day | ORAL | Status: DC
  Administered 2017-12-06 – 2017-12-13 (×8): 15 mg via ORAL
  Filled 2017-12-05 (×8): qty 3

## 2017-12-05 MED ORDER — POLYETHYLENE GLYCOL 3350 17 GM/SCOOP PO POWD
8.6000 g | Freq: Every day | ORAL | Status: DC
  Filled 2017-12-05: qty 255

## 2017-12-05 MED ORDER — POLYETHYLENE GLYCOL 3350 17 G PO PACK: 17.0000 g | PACK | Freq: Every day | ORAL | Status: DC | PRN

## 2017-12-05 MED ORDER — ROPINIROLE HCL 0.25 MG PO TABS
0.5000 mg | ORAL_TABLET | Freq: Every day | ORAL | Status: DC
  Administered 2017-12-05 – 2017-12-12 (×8): 0.5 mg via ORAL
  Filled 2017-12-05 (×4): qty 2
  Filled 2017-12-05: qty 1
  Filled 2017-12-05 (×4): qty 2
  Filled 2017-12-05: qty 1

## 2017-12-05 MED ORDER — RISPERIDONE 0.5 MG PO TABS
0.2500 mg | ORAL_TABLET | Freq: Every day | ORAL | Status: DC
  Administered 2017-12-06 – 2017-12-13 (×8): 0.25 mg via ORAL
  Filled 2017-12-05 (×8): qty 1

## 2017-12-05 MED ORDER — POTASSIUM CHLORIDE IN NACL 40-0.9 MEQ/L-% IV SOLN
INTRAVENOUS | Status: DC
  Administered 2017-12-05: 75 mL/h via INTRAVENOUS

## 2017-12-05 NOTE — ED Triage Notes (Signed)
EMS brought pt in from [facility] for evaluation of altered mental status.  Unknown onset but pt was rx keflex for uti 3 days ago.  Pt states she woke up this morning and was having trouble talking and had a headache and dizziness.  EMS reports glucose read "high" and pt is not diabetic.

## 2017-12-05 NOTE — H&P (Signed)
History and Physical  Christine Hancock:[id number] DOB: 25-Nov-1917 DOA: 12/05/2017  Referring physician: Ethelene Browns PCP: Sinda Du, MD   Chief Complaint: weakness   Historian: Pt is poor historian, most history taken from ED providers, niece and records.   HPI: Christine Hancock is a 84 y.o. female presenting for evaluation of weakness.  Pt also having some abdominal pain.  For the past 3 days patient has not been feeling well.    Patient states she has been having difficulty with her words.  Pt can't remember when she had her last bowel movement.  Her symptoms have been present for the past 3 days.   She denies known fevers, cough.  Per facility paperwork, patient has been treated for the last 3 days for a UTI with Keflex 500 mg 3 times daily.  She is not on any prednisone or steroids.  Patient is on Eliquis, she denies fall, trauma, or injury.  Per EMS, patient CBG was read as high.  Patient does not have a history of diabetes.   Additional history obtained from chart review, patient with a history of recent GI bleed, CKD, hypotension, hypothyroidism, CHF.  Is on O2 at baseline, 2 L.  She has not needed to raise her O2 recently. Pt in wheelchair at baseline.  Per chart review, pt was DNR at last hospitalization.  Discussed with staff member at [facility] where patient is a resident.  Per staff member, patient has been confused and slightly altered for the past 3 days.  Today they noticed that her speech was more slurred.  They do not know when this began, as the person was talking to was not present over the weekend.  Patient has been afebrile and not hypothermic.  Staff member confirms that patient does not have a history of diabetes.  I also spoke with niece who confirms that patient does not have a history of diabetes mellitus.   She was noted in ED to have a blood sugar greater than 600.  She was also noted to be hypothermic and was started on broad spectrum antibiotics, warmed IV fluids  and a bear hugger warming device.  She is being admitted to SDU for hyperosmolar nonketotic syndrome for IV insulin infusion.    Review of Systems: as per HPI, difficult to obtain, All systems reviewed and apart from history of presenting illness, are negative.  Past Medical History:  Diagnosis Date  . Acute gastrointestinal hemorrhage   . Anemia, unspecified   . Anxiety   . Chronic kidney disease, stage I   . DVT, lower extremity, recurrent ([place])   . GERD (gastroesophageal reflux disease)   . Hypercholesterolemia   . Hyperglycemic hyperosmolar nonketotic coma ([place]) 12/05/2017  . Hypotension   . Hypothyroidism   . Phlebitis   . Pure hypercholesterolemia    Past Surgical History:  Procedure Laterality Date  . ABDOMINAL HYSTERECTOMY     partial  . CATARACT EXTRACTION W/PHACO Right 06/28/2013   Procedure: CATARACT EXTRACTION PHACO AND INTRAOCULAR LENS PLACEMENT (IOC);  Surgeon: Tonny Branch, MD;  Location: [facility];  Service: Ophthalmology;  Laterality: Right;  CDE:13.79  . CATARACT EXTRACTION W/PHACO Left 07/26/2013   Procedure: CATARACT EXTRACTION PHACO AND INTRAOCULAR LENS PLACEMENT LEFT EYE;  Surgeon: Tonny Branch, MD;  Location: [facility];  Service: Ophthalmology;  Laterality: Left;  CDE: 11.76  . CHOLECYSTECTOMY    . ESOPHAGEAL DILATION    . ESOPHAGOGASTRODUODENOSCOPY  2013   Dr. Gala Romney: noncritical Schatzki's ring, scattered erosions,  normal duodenum, path with moderate chronic atrophic gastritis  . TONSILLECTOMY     Social History:  reports that she quit smoking about 12 years ago. Her smoking use included cigarettes. She has a 3.75 pack-year smoking history. She has never used smokeless tobacco. She reports that she does not drink alcohol or use drugs.  No Known Allergies  Family History  Problem Relation Age of Onset  . Diabetes Mother   . Diabetes Brother   . Anemia Brother   . Anemia Sister   . Coronary artery disease Other     Prior to Admission medications   Medication Sig  Start Date End Date Taking? Authorizing Provider  acetaminophen (TYLENOL) 325 MG tablet Take 650 mg by mouth every 6 (six) hours as needed for mild pain or moderate pain.     [provider]  albuterol (PROVENTIL HFA;VENTOLIN HFA) 108 (90 Base) MCG/ACT inhaler Inhale 2 puffs into the lungs every 6 (six) hours as needed for wheezing or shortness of breath. 10/28/16   Sinda Du, MD  apixaban (ELIQUIS) 2.5 MG TABS tablet Take 1 tablet (2.5 mg total) by mouth 2 (two) times daily. 08/29/17   Sinda Du, MD  carboxymethylcellulose (REFRESH PLUS) 0.5 % SOLN Place 1 drop into both eyes 4 (four) times daily.    [provider]  ferrous sulfate 325 (65 FE) MG tablet Take 1 tablet (325 mg total) by mouth daily. 08/29/17 08/29/18  Sinda Du, MD  furosemide (LASIX) 40 MG tablet TAKE 1 TABLET BY MOUTH TWICE DAILY. Patient taking differently: Take 40 mg by mouth 2 (two) times daily. *May take one tablet as needed for increase in weight of 3 LBS overnight or 5 LBS increase in a week 09/02/17   Arnoldo Lenis, MD  gabapentin (NEURONTIN) 300 MG capsule Take 600 mg by mouth at bedtime.    [provider]  ketoconazole (NIZORAL) 2 % shampoo Apply 1 application topically See admin instructions. Once weekly As needed for antifungal.    [provider]  ketorolac (TORADOL) 10 MG tablet Take 10 mg by mouth every 6 (six) hours as needed for moderate pain (to hand).     [provider]  levothyroxine (SYNTHROID, LEVOTHROID) 88 MCG tablet Take 88 mcg by mouth daily before breakfast.    [provider]  LINZESS 290 MCG CAPS capsule Take 290 mcg by mouth daily before breakfast.  09/07/16   [provider]  neomycin-polymyxin-dexameth (MAXITROL) 0.1 % OINT Place 1 application into both eyes 3 (three) times daily. For dry eyes    [provider]  NIFEdipine (PROCARDIA-XL/ADALAT CC) 30 MG 24 hr tablet Take 30 mg by mouth daily.    [provider]  omeprazole (PRILOSEC) 20 MG capsule Take 20 mg by mouth 2 (two) times daily before a meal.     [provider]  oxybutynin (DITROPAN XL) 15 MG 24 hr tablet Take 15 mg by mouth daily.    [provider]  polyethylene glycol powder (GLYCOLAX/MIRALAX) powder Take 8.6 g by mouth daily.  09/20/16   [provider]  pravastatin (PRAVACHOL) 40 MG tablet Take 40 mg by mouth at bedtime.  09/07/16   [provider]  risperiDONE (RISPERDAL) 0.25 MG tablet Take 0.25 mg by mouth daily.    [provider]  rOPINIRole (REQUIP) 0.5 MG tablet Take 0.5 mg by mouth at bedtime.    [provider]  silver sulfADIAZINE (SILVADENE) 1 % cream Apply 1 application topically daily. Left  toe    [provider]  traZODone (DESYREL) 50 MG tablet Take 25 mg by mouth at bedtime.    [provider]  trolamine salicylate (ASPERCREME) 10 % cream Apply 1 application topically every 12 (twelve) hours as needed for muscle pain.     [provider]  umeclidinium-vilanterol (ANORO ELLIPTA) 62.5-25 MCG/INH AEPB Inhale 1 puff into the lungs daily.    [provider]   Physical Exam: Vitals:   12/05/17 1004 12/05/17 1027 12/05/17 1056 12/05/17 1130  BP:  115/73  112/65  Pulse: 67 68  69  Resp: 16 14  16   Temp:   (!) 93.4 F (34.1 C)   TempSrc:   Rectal   SpO2: 94% 98%  94%  Weight:      Height:        Constitutional: She is oriented to person, place, and time. She appears well-developed and well-nourished. No distress.  Elderly female who appears chronically ill.  She has very dry mucus membranes.  HENT: Head: Normocephalic and atraumatic.  Neck: Normal range of motion. Neck supple.  Cardiovascular: Normal rate, regular rhythm and intact distal pulses.  Pulmonary/Chest: Effort normal and breath sounds normal. No respiratory distress. She has no wheezes. Clear lung sounds in all fields.  Abdominal: mild to moderate distension and diffuse  tenderness.  No specific masses but stool in LLQ palpated. There is no rebound and no guarding.    Musculoskeletal: Normal range of motion.  Strength intact x4.  Sensation intact x4.  Radial pedal pulses intact bilaterally.  Neurological: She is alert and oriented to person, place, and time. She has normal strength. No cranial nerve deficit or sensory deficit. Some speech abnormality, but unknown if this is different from baseline.  She is speaking in full sentences.  Alert and oriented x4.  Patient states she does not have her lower dentures in.  CN fully intact.  Skin: Skin is warm and dry. Capillary refill takes less than 2 seconds.  Psychiatric: She has a normal mood and affect.   Labs on Admission:  Basic Metabolic Panel: Recent Labs  Lab 12/05/17 0922  NA 129*  K 3.9  CL 92*  CO2 27  GLUCOSE 621*  BUN 34*  CREATININE 1.62*  CALCIUM 8.8*   Liver Function Tests: Recent Labs  Lab 12/05/17 0922  AST 24  ALT 21  ALKPHOS 166*  BILITOT 0.7  PROT 7.7  ALBUMIN 3.3*   Recent Labs  Lab 12/05/17 0922  LIPASE 28   No results for input(s): AMMONIA in the last 168 hours. CBC: Recent Labs  Lab 12/05/17 0922  WBC 4.9  NEUTROABS 3.2  HGB 11.0*  HCT 33.9*  MCV 85.6  PLT 212   Cardiac Enzymes: No results for input(s): CKTOTAL, CKMB, CKMBINDEX, TROPONINI in the last 168 hours.  BNP (last 3 results) No results for input(s): PROBNP in the last 8760 hours. CBG: Recent Labs  Lab 12/05/17 0903 12/05/17 1115  GLUCAP >600* 564*    Radiological Exams on Admission: Dg Chest 2 View  Result Date: 12/05/2017 CLINICAL DATA:  Altered mental status EXAM: CHEST - 2 VIEW COMPARISON:  09/30/2017 FINDINGS: Bibasilar atelectasis. Mild cardiomegaly. No visible effusions or overt edema. No acute bony abnormality. IMPRESSION: Cardiomegaly.  Bibasilar atelectasis. Electronically Signed   By: Rolm Baptise M.D.   On: 12/05/2017 10:06   Ct Head Wo Contrast  Result Date:  12/05/2017 CLINICAL DATA:  Altered level of consciousness. EXAM: CT HEAD WITHOUT CONTRAST TECHNIQUE: Contiguous axial  images were obtained from the base of the skull through the vertex without intravenous contrast. COMPARISON:  08/24/2017 FINDINGS: Brain: There is atrophy and chronic small vessel disease changes. No acute intracranial abnormality. Specifically, no hemorrhage, hydrocephalus, mass lesion, acute infarction, or significant intracranial injury. Vascular: No hyperdense vessel or unexpected calcification. Skull: No acute calvarial abnormality. Sinuses/Orbits: Visualized paranasal sinuses and mastoids clear. Orbital soft tissues unremarkable. Other: None IMPRESSION: No acute intracranial abnormality. Atrophy, chronic microvascular disease. Electronically Signed   By: Rolm Baptise M.D.   On: 12/05/2017 09:55   Assessment/Plan Principal Problem:   Hyperglycemic hyperosmolar nonketotic coma ([place]) Active Problems:   Hypothermia   Hypothyroidism   Dehydration   Failure to thrive in adult   Acute lower UTI   AKI (acute kidney injury) ([place])   Chronic diastolic CHF (congestive heart failure) ([facility])   Chronic kidney disease (CKD), stage III (moderate) ([facility])   GERD (gastroesophageal reflux disease)   DNR (do not resuscitate)  1. Hyperglycemic hyperosmolar nonketotic syndrome - Pt has developed diabetes mellitus with a blood glucose greater than 600.  Will treat with IV insulin glucostabilizer program and monitor blood glucose every 1 hour while on IV insulin infusion. Keep NPO for now while on IV insulin.  Gently hydrate with IV normal saline with potassium.  Follow electrolytes closely with serial BMP testing.  Check A1c.  Hopefully can transition over to subcutaneous insulin in the morning.   2. Hypothermia - Suspect this may be due to dehydration and hyperglycemia but will cover with broad spectrum antibiotics pending urine and blood cultures.  Also suspect she could have a pneumonia that is not  showing up on chest xray due to dehydration.  Continue broad spectrum antibiotics.  Continue warmed IV fluids, Bair hugger.  Follow in stepdown ICU.  3. Nonspecific abdominal pain - pt has a hard abdomen, will obtain STAT CT abdomen for evaluation.  Pt unsure of last bowel movement.  Will order for dulcolax suppository.   4. Acute neurological changes - suspect that this is secondary to hyperglycemia, CT head with no acute changes.  Neuro checks ordered.  5. Hypothyroidism - She does have an elevated TSH but in the setting of acute critical illness may not be an accurate reading but an acute phase reading.  Continue current dose of thyroid replacement for now.  check free T4 test.   6. Acute UTI - Pt had recently been treated for a UTI with oral cephalexin.  Current urinalysis not demonstrating infection.   7. AKI - secondary to dehydration, treating with IV fluids..following BMP.  8. Chronic diastolic CHF - we are being gentle with fluid hydration, monitor intake and output closely.  Follow weights.  9. GERD - IV famotidine for GI protection.  10. DNR - no intubation or chest compressions.  11. Chronic anticoagulation - continue apixaban for history of VTE.    DVT Prophylaxis: apixaban Code Status: DNR  Family Communication: niece (phone)  Disposition Plan: SNF when medically stable    Critical Care Time spent: 75 mins  Irwin Brakeman, MD [facility] Pager [phone]  If 7PM-7AM, please contact night-coverage [email or web address] Password [email or web address] 12/05/2017, 11:42 AM

## 2017-12-05 NOTE — Progress Notes (Signed)
Pt attempted IS but was unable to perform. Pt attempted 3 different times with same results

## 2017-12-05 NOTE — ED Notes (Signed)
Pt's niece who is her primary contact person called to inquire about pt's status.  She can be reached at [phone] and her name is [name].

## 2017-12-05 NOTE — ED Provider Notes (Signed)
[facility] EMERGENCY DEPARTMENT Provider Note   CSN: [id number] Arrival date & time: 12/05/17  0857     History   Chief Complaint Chief Complaint  Patient presents with  . Weakness  . Hyperglycemia    HPI Christine Hancock is a 84 y.o. female presenting for evaluation of weakness.  Patient states for the past 3 days, she has not been feeling well.  She cannot describe exactly what has not been feeling well.  Patient states she has been having difficulty with her words.  This is been going on for the past 3 days, but she also states she does not have her lower dentures in.  She denies known fevers, cough, abdominal pain. Per facility paperwork, patient has been tx for the last 3 days for a UTI with Keflex 500 mg 3 times daily.  She is not on any prednisone or steroids.  Patient is on Eliquis, she denies fall, trauma, or injury.  Per EMS, patient CBG was read as high.  Patient does not have a history of diabetes.  Patient confirms this. Additional history obtained from chart review, patient with a history of recent GI bleed, CKD, hypotension, hypothyroidism, CHF.  Is on O2 at baseline, 2 L.  She has not needed to raise her O2 recently. Pt in wheelchair at baseline.  Per chart review, pt was DNR at last hospitalization.  Discussed with staff member at [facility] where patient is a resident.  Per staff member, patient has been confused and slightly altered for the past 3 days.  Today they noticed that her speech was more slurred.  They do not know when this began, as the person was talking to was not present over the weekend.  Patient has been afebrile and not hypothermic.  Staff member confirms that patient does not have a history of diabetes.  HPI  Past Medical History:  Diagnosis Date  . Acute gastrointestinal hemorrhage   . Anemia, unspecified   . Anxiety   . Chronic kidney disease, stage I   . DVT, lower extremity, recurrent ([place])   . GERD (gastroesophageal reflux disease)   .  Hypercholesterolemia   . Hyperglycemic hyperosmolar nonketotic coma ([place]) 12/05/2017  . Hypotension   . Hypothyroidism   . Phlebitis   . Pure hypercholesterolemia     Patient Active Problem List   Diagnosis Date Noted  . Hyperglycemic hyperosmolar nonketotic coma ([place]) 12/05/2017  . GERD (gastroesophageal reflux disease) 12/05/2017  . Hypothermia 12/05/2017  . DNR (do not resuscitate) 12/05/2017  . Anemia due to chronic blood loss 08/28/2017  . Iron deficiency anemia due to chronic blood loss   . GI bleed 08/25/2017  . GIB (gastrointestinal bleeding) 08/24/2017  . Chronic diastolic CHF (congestive heart failure) ([place]) 08/24/2017  . Chronic kidney disease (CKD), stage III (moderate) ([place]) 08/24/2017  . Symptomatic anemia   . Acute on chronic diastolic heart failure ([place]) 10/30/2016  . Acute lower UTI 10/27/2016  . AKI (acute kidney injury) ([place]) 10/27/2016  . Dehydration 10/24/2011  . Acute on chronic renal failure ([place]) 10/24/2011  . Failure to thrive in adult 10/24/2011  . Microcytic anemia 10/24/2011  . Anxiety 10/24/2011  . Hypothyroidism 10/24/2011    Past Surgical History:  Procedure Laterality Date  . ABDOMINAL HYSTERECTOMY     partial  . CATARACT EXTRACTION W/PHACO Right 06/28/2013   Procedure: CATARACT EXTRACTION PHACO AND INTRAOCULAR LENS PLACEMENT (IOC);  Surgeon: Tonny Branch, MD;  Location: [facility];  Service: Ophthalmology;  Laterality: Right;  CDE:13.79  .  CATARACT EXTRACTION W/PHACO Left 07/26/2013   Procedure: CATARACT EXTRACTION PHACO AND INTRAOCULAR LENS PLACEMENT LEFT EYE;  Surgeon: Tonny Branch, MD;  Location: [facility];  Service: Ophthalmology;  Laterality: Left;  CDE: 11.76  . CHOLECYSTECTOMY    . ESOPHAGEAL DILATION    . ESOPHAGOGASTRODUODENOSCOPY  2013   Dr. Gala Romney: noncritical Schatzki's ring, scattered erosions, normal duodenum, path with moderate chronic atrophic gastritis  . TONSILLECTOMY       OB History   None      Home Medications    Prior to  Admission medications   Medication Sig Start Date End Date Taking? Authorizing Provider  acetaminophen (TYLENOL) 325 MG tablet Take 650 mg by mouth every 6 (six) hours as needed for mild pain or moderate pain.     [provider]  albuterol (PROVENTIL HFA;VENTOLIN HFA) 108 (90 Base) MCG/ACT inhaler Inhale 2 puffs into the lungs every 6 (six) hours as needed for wheezing or shortness of breath. 10/28/16   Sinda Du, MD  apixaban (ELIQUIS) 2.5 MG TABS tablet Take 1 tablet (2.5 mg total) by mouth 2 (two) times daily. 08/29/17   Sinda Du, MD  carboxymethylcellulose (REFRESH PLUS) 0.5 % SOLN Place 1 drop into both eyes 4 (four) times daily.    [provider]  ferrous sulfate 325 (65 FE) MG tablet Take 1 tablet (325 mg total) by mouth daily. 08/29/17 08/29/18  Sinda Du, MD  furosemide (LASIX) 40 MG tablet TAKE 1 TABLET BY MOUTH TWICE DAILY. Patient taking differently: Take 40 mg by mouth 2 (two) times daily. *May take one tablet as needed for increase in weight of 3 LBS overnight or 5 LBS increase in a week 09/02/17   Arnoldo Lenis, MD  gabapentin (NEURONTIN) 300 MG capsule Take 600 mg by mouth at bedtime.    [provider]  ketoconazole (NIZORAL) 2 % shampoo Apply 1 application topically See admin instructions. Once weekly As needed for antifungal.    [provider]  ketorolac (TORADOL) 10 MG tablet Take 10 mg by mouth every 6 (six) hours as needed for moderate pain (to hand).     [provider]  levothyroxine (SYNTHROID, LEVOTHROID) 88 MCG tablet Take 88 mcg by mouth daily before breakfast.    [provider]  LINZESS 290 MCG CAPS capsule Take 290 mcg by mouth daily before breakfast.  09/07/16   [provider]  neomycin-polymyxin-dexameth (MAXITROL) 0.1 % OINT Place 1 application into both eyes 3 (three) times daily. For dry eyes    [provider]  NIFEdipine (PROCARDIA-XL/ADALAT CC) 30 MG 24 hr tablet Take 30 mg  by mouth daily.    [provider]  omeprazole (PRILOSEC) 20 MG capsule Take 20 mg by mouth 2 (two) times daily before a meal.     [provider]  oxybutynin (DITROPAN XL) 15 MG 24 hr tablet Take 15 mg by mouth daily.    [provider]  polyethylene glycol powder (GLYCOLAX/MIRALAX) powder Take 8.6 g by mouth daily.  09/20/16   [provider]  pravastatin (PRAVACHOL) 40 MG tablet Take 40 mg by mouth at bedtime.  09/07/16   [provider]  risperiDONE (RISPERDAL) 0.25 MG tablet Take 0.25 mg by mouth daily.    [provider]  rOPINIRole (REQUIP) 0.5 MG tablet Take 0.5 mg by mouth at bedtime.    [provider]  silver sulfADIAZINE (SILVADENE) 1 % cream Apply 1 application topically daily. Left toe    [provider]  traZODone (DESYREL) 50 MG tablet Take 25 mg by mouth at bedtime.    [provider]  trolamine salicylate (ASPERCREME) 10 % cream Apply 1 application topically every 12 (twelve) hours as needed for muscle pain.     [provider]  umeclidinium-vilanterol (ANORO ELLIPTA) 62.5-25 MCG/INH AEPB Inhale 1 puff into the lungs daily.    [provider]    Family History Family History  Problem Relation Age of Onset  . Diabetes Mother   . Diabetes Brother   . Anemia Brother   . Anemia Sister   . Coronary artery disease Other     Social History Social History   Tobacco Use  . Smoking status: Former Smoker    Packs/day: 0.25    Years: 15.00    Pack years: 3.75    Types: Cigarettes    Last attempt to quit: 10/22/2005    Years since quitting: 12.1  . Smokeless tobacco: Never Used  Substance Use Topics  . Alcohol use: No  . Drug use: No     Allergies   Patient has no known allergies.   Review of Systems Review of Systems  Neurological: Positive for speech difficulty and weakness.  All other systems reviewed and are negative.    Physical Exam Updated Vital Signs BP  112/65   Pulse 69   Temp (!) 93.4 F (34.1 C) (Rectal)   Resp 16   Ht 5\' 2"  (1.575 m)   Wt 84 kg   SpO2 94%   BMI 33.87 kg/m   Physical Exam  Constitutional: She is oriented to person, place, and time. She appears well-developed and well-nourished. No distress.  Elderly female who appears chronically ill. Cool to the touch  HENT:  Head: Normocephalic and atraumatic.  Eyes: Pupils are equal, round, and reactive to light. Conjunctivae and EOM are normal.  Neck: Normal range of motion. Neck supple.  Cardiovascular: Normal rate, regular rhythm and intact distal pulses.  Pulmonary/Chest: Effort normal and breath sounds normal. No respiratory distress. She has no wheezes.  Clear lung sounds in all fields.  Abdominal: Soft. She exhibits no distension and no mass. There is no tenderness. There is no rebound and no guarding.  Soft without rigidity, guarding or distention.  Musculoskeletal: Normal range of motion.  Strength intact x4.  Sensation intact x4.  Radial pedal pulses intact bilaterally.  Neurological: She is alert and oriented to person, place, and time. She has normal strength. No cranial nerve deficit or sensory deficit. GCS eye subscore is 4. GCS verbal subscore is 5. GCS motor subscore is 6.  Some speech abnormality, but unknown if this is different from baseline.  She is speaking in full sentences.  Alert and oriented x4.  Patient states she does not have her lower dentures in.  CN intact.  Skin: Skin is warm and dry. Capillary refill takes less than 2 seconds.  Psychiatric: She has a normal mood and affect.  Nursing note and vitals reviewed.    ED Treatments / Results  Labs (all labs ordered are listed, but only abnormal results are displayed) Labs Reviewed  CBC WITH DIFFERENTIAL/PLATELET - Abnormal; Notable for the following components:      Result Value   Hemoglobin 11.0 (*)    HCT 33.9 (*)    RDW 16.1 (*)    All other components within normal limits  COMPREHENSIVE  METABOLIC PANEL - Abnormal; Notable for the following components:   Sodium 129 (*)    Chloride 92 (*)  Glucose, Bld 621 (*)    BUN 34 (*)    Creatinine, Ser 1.62 (*)    Calcium 8.8 (*)    Albumin 3.3 (*)    Alkaline Phosphatase 166 (*)    GFR calc non Af Amer 25 (*)    GFR calc Af Amer 29 (*)    All other components within normal limits  URINALYSIS, ROUTINE W REFLEX MICROSCOPIC - Abnormal; Notable for the following components:   Glucose, UA >=500 (*)    Hgb urine dipstick LARGE (*)    All other components within normal limits  TSH - Abnormal; Notable for the following components:   TSH 13.742 (*)    All other components within normal limits  CBG MONITORING, ED - Abnormal; Notable for the following components:   Glucose-Capillary >600 (*)    All other components within normal limits  CBG MONITORING, ED - Abnormal; Notable for the following components:   Glucose-Capillary 564 (*)    All other components within normal limits  CULTURE, BLOOD (ROUTINE X 2)  CULTURE, BLOOD (ROUTINE X 2)  URINE CULTURE  LIPASE, BLOOD  BLOOD GAS, VENOUS  I-STAT CG4 LACTIC ACID, ED    EKG None  Radiology Dg Chest 2 View  Result Date: 12/05/2017 CLINICAL DATA:  Altered mental status EXAM: CHEST - 2 VIEW COMPARISON:  09/30/2017 FINDINGS: Bibasilar atelectasis. Mild cardiomegaly. No visible effusions or overt edema. No acute bony abnormality. IMPRESSION: Cardiomegaly.  Bibasilar atelectasis. Electronically Signed   By: Rolm Baptise M.D.   On: 12/05/2017 10:06   Ct Head Wo Contrast  Result Date: 12/05/2017 CLINICAL DATA:  Altered level of consciousness. EXAM: CT HEAD WITHOUT CONTRAST TECHNIQUE: Contiguous axial images were obtained from the base of the skull through the vertex without intravenous contrast. COMPARISON:  08/24/2017 FINDINGS: Brain: There is atrophy and chronic small vessel disease changes. No acute intracranial abnormality. Specifically, no hemorrhage, hydrocephalus, mass lesion,  acute infarction, or significant intracranial injury. Vascular: No hyperdense vessel or unexpected calcification. Skull: No acute calvarial abnormality. Sinuses/Orbits: Visualized paranasal sinuses and mastoids clear. Orbital soft tissues unremarkable. Other: None IMPRESSION: No acute intracranial abnormality. Atrophy, chronic microvascular disease. Electronically Signed   By: Rolm Baptise M.D.   On: 12/05/2017 09:55    Procedures .Critical Care Performed by: Franchot Heidelberg, PA-C Authorized by: Franchot Heidelberg, PA-C   Critical care provider statement:    Critical care time (minutes):  45   Critical care time was exclusive of:  Separately billable procedures and treating other patients and teaching time   Critical care was necessary to treat or prevent imminent or life-threatening deterioration of the following conditions:  Shock and endocrine crisis   Critical care was time spent personally by me on the following activities:  Blood draw for specimens, development of treatment plan with patient or surrogate, discussions with consultants, evaluation of patient's response to treatment, examination of patient, obtaining history from patient or surrogate, ordering and review of laboratory studies, ordering and performing treatments and interventions, ordering and review of radiographic studies, pulse oximetry, re-evaluation of patient's condition and review of old charts   I assumed direction of critical care for this patient from another provider in my specialty: no   Comments:     Pt presenting with elevated blood sugar and low temperature.  Bair hugger applied.   (including critical care time)  Medications Ordered in ED Medications  sodium chloride 0.9 % bolus 500 mL (0 mLs Intravenous Stopped 12/05/17 1118)     Initial Impression /  Assessment and Plan / ED Course  I have reviewed the triage vital signs and the nursing notes.  Pertinent labs & imaging results that were available during  my care of the patient were reviewed by me and considered in my medical decision making (see chart for details).     Patient presenting for evaluation of confusion and weakness for 3 days.  On exam, patient is alert and oriented, however has slight difficulty with speech.  Patient is not sure if this is due to her dentures.  Otherwise no focal neuro deficit.  Unknown time when the speech abnormalities began, no stroke called.  Additionally, patient with hyperglycemia without a history of diabetes, this is more likely cause for her speech and mental status.  Patient is cool to the touch, slightly hypothermic orally.  Will obtain rectal temperature.  At this time, concern for sepsis, DKA, CVA, ICH (patient is on Eliquis, recently had GI bleed).  Will obtain labs, CT head, chest x-ray, urine, and start fluids.  Case discussed with attending, Dr. Alvino Chapel evaluated the patient.  Chest x-ray viewed interpreted by me, no pneumonia.  Labs without leukocytosis.  VBG reassuring.  CMP shows elevated glucose at 621, no DKA.  Mild AKI with increasing creatinine.  Pseudohyponatremia noted.  Lipase reassuring, doubt pancreatic cause.  Rectal temp concerningly low at 95.  Will give warm fluids and apply warm blankets and reassess.  Blood cultures and TSH ordered.  CT head without bleed or acute findings.    Patient's temperature continues to decrease, currently 93.4 rectally despite warm fluids and blankets.  Bair hugger applied.  CBG improving slowly, 564. Will hold on IV insulin for admitting team.  TSH elevated at 13.7, doubt myxedema,likely stress related.  Lactic normal.  Will call for admission for hypoglycemia and hypothermia.  Discussed with Dr. Wynetta Emery from [facility] hospitalist service, patient to be admitted.   Final Clinical Impressions(s) / ED Diagnoses   Final diagnoses:  Hypothermia, initial encounter  Hyperglycemia    ED Discharge Orders    None       Franchot Heidelberg, PA-C 12/05/17 1655     Davonna Belling, MD 12/06/17 [phone]

## 2017-12-05 NOTE — Progress Notes (Signed)
Pharmacy Note:  Initial antibiotic(s) regimen of Vancomycin and Zosyn  ordered by EDP to treat sepsis.  Estimated Creatinine Clearance: 19.5 mL/min (A) (by C-G formula based on SCr of 1.62 mg/dL (H)).   No Known Allergies  Vitals:   12/05/17 1100 12/05/17 1130  BP: 124/63 112/65  Pulse: 72 69  Resp: 17 16  Temp:    SpO2: 97% 94%    Anti-infectives (From admission, onward)   Start     Dose/Rate Route Frequency Ordered Stop   12/05/17 1200  piperacillin-tazobactam (ZOSYN) IVPB 3.375 g     3.375 g 100 mL/hr over 30 Minutes Intravenous Once 12/05/17 1150     12/05/17 1200  vancomycin (VANCOCIN) IVPB 1000 mg/200 mL premix     1,000 mg 200 mL/hr over 60 Minutes Intravenous  Once 12/05/17 1151        Antimicrobials this admission:  10/14 vancomycin>>   10/14 >> Zosyn   Microbiology results:  10/14  BCx2: pending 10/14 UCx: pending    Plan: Initial dose(s) of vancomycin 1g and Zosyn 3.375g IV X 1 ordered. F/U admission orders for further dosing if therapy continued.  Despina Pole, [facility] 12/05/2017 11:51 AM

## 2017-12-05 NOTE — ED Notes (Signed)
Date and time results received: 12/05/17 9:55 AM  (use smartphrase ".now" to insert current time)  Test: Glucose Critical Value: 621  Name of Provider Notified: Alvino Chapel  Orders Received? Or Actions Taken?: Orders Received - See Orders for details

## 2017-12-05 NOTE — ED Notes (Signed)
Zosyn out of stock in pyxis.   Pharmacy aware.

## 2017-12-05 NOTE — Progress Notes (Signed)
Dr. Wynetta Emery made aware of recent BG checks with last reading of 193 at 1833. Per Dr. Wynetta Emery, continue drip for another hour and stated he would be putting in transition orders. NS with 20KCL continues to infuse at 32ml/hr per Dr. Wynetta Emery.

## 2017-12-06 ENCOUNTER — Inpatient Hospital Stay (HOSPITAL_COMMUNITY): Payer: Medicare Other

## 2017-12-06 LAB — CBC WITH DIFFERENTIAL/PLATELET
ABS IMMATURE GRANULOCYTES: 0.02 10*3/uL (ref 0.00–0.07)
BASOS PCT: 1 %
Basophils Absolute: 0 10*3/uL (ref 0.0–0.1)
Eosinophils Absolute: 0.1 10*3/uL (ref 0.0–0.5)
Eosinophils Relative: 2 %
HEMATOCRIT: 31.7 % — AB (ref 36.0–46.0)
Hemoglobin: 10.1 g/dL — ABNORMAL LOW (ref 12.0–15.0)
IMMATURE GRANULOCYTES: 0 %
LYMPHS PCT: 27 %
Lymphs Abs: 1.6 10*3/uL (ref 0.7–4.0)
MCH: 27.9 pg (ref 26.0–34.0)
MCHC: 31.9 g/dL (ref 30.0–36.0)
MCV: 87.6 fL (ref 80.0–100.0)
MONOS PCT: 9 %
Monocytes Absolute: 0.5 10*3/uL (ref 0.1–1.0)
NEUTROS ABS: 3.7 10*3/uL (ref 1.7–7.7)
NEUTROS PCT: 61 %
PLATELETS: 210 10*3/uL (ref 150–400)
RBC: 3.62 MIL/uL — ABNORMAL LOW (ref 3.87–5.11)
RDW: 16.4 % — ABNORMAL HIGH (ref 11.5–15.5)
WBC: 6 10*3/uL (ref 4.0–10.5)
nRBC: 0 % (ref 0.0–0.2)

## 2017-12-06 LAB — BASIC METABOLIC PANEL
Anion gap: 7 (ref 5–15)
BUN: 26 mg/dL — AB (ref 8–23)
CO2: 23 mmol/L (ref 22–32)
Calcium: 8.4 mg/dL — ABNORMAL LOW (ref 8.9–10.3)
Chloride: 106 mmol/L (ref 98–111)
Creatinine, Ser: 1.33 mg/dL — ABNORMAL HIGH (ref 0.44–1.00)
GFR calc Af Amer: 37 mL/min — ABNORMAL LOW (ref >60)
GFR calc non Af Amer: 32 mL/min — ABNORMAL LOW (ref >60)
GLUCOSE: 150 mg/dL — AB (ref 70–99)
Potassium: 4.1 mmol/L (ref 3.5–5.1)
SODIUM: 136 mmol/L (ref 135–145)

## 2017-12-06 LAB — URINE CULTURE

## 2017-12-06 LAB — MAGNESIUM: Magnesium: 2.4 mg/dL (ref 1.7–2.4)

## 2017-12-06 LAB — GLUCOSE, CAPILLARY
Glucose-Capillary: 143 mg/dL — ABNORMAL HIGH (ref 70–99)
Glucose-Capillary: 160 mg/dL — ABNORMAL HIGH (ref 70–99)
Glucose-Capillary: 351 mg/dL — ABNORMAL HIGH (ref 70–99)
Glucose-Capillary: 255 mg/dL — ABNORMAL HIGH (ref 70–99)
Glucose-Capillary: 289 mg/dL — ABNORMAL HIGH (ref 70–99)

## 2017-12-06 MED ORDER — PIPERACILLIN-TAZOBACTAM 3.375 G IVPB
3.3750 g | Freq: Three times a day (TID) | INTRAVENOUS | Status: DC
  Administered 2017-12-06 – 2017-12-12 (×18): 3.375 g via INTRAVENOUS
  Filled 2017-12-06 (×18): qty 50

## 2017-12-06 MED ORDER — VANCOMYCIN HCL IN DEXTROSE 750-5 MG/150ML-% IV SOLN
750.0000 mg | INTRAVENOUS | Status: DC
  Administered 2017-12-06: 750 mg via INTRAVENOUS
  Filled 2017-12-06 (×4): qty 150

## 2017-12-06 NOTE — Progress Notes (Signed)
Pharmacy Antibiotic Note  Christine Hancock is a 83 y.o. female admitted on 12/05/2017 with sepsis.  Pharmacy has been consulted for Vancomycin/Zosyn dosing.  Plan: Vancomycin 750 mg IV every 24 hours.  Goal trough 15-20 mcg/mL. Zosyn 3.375g IV q8h (4 hour infusion).  Monitor labs, c/s, and vanco trough as indicated  Height: 5\' 2"  (157.5 cm) Weight: 173 lb 8 oz (78.7 kg) IBW/kg (Calculated) : 50.1  Temp (24hrs), Avg:96.8 F (36 C), Min:93.4 F (34.1 C), Max:98.3 F (36.8 C)  Recent Labs  Lab 12/05/17 0922 12/05/17 1129 12/06/17 0339  WBC 4.9  --  6.0  CREATININE 1.62*  --  1.33*  LATICACIDVEN  --  0.80  --     Estimated Creatinine Clearance: 22.9 mL/min (A) (by C-G formula based on SCr of 1.33 mg/dL (H)).    No Known Allergies  Antimicrobials this admission: Vanco 10/14 >>  Zosyn 10/14 >>    Microbiology results: 10/14 BCx: ngtd 10/14 UCx: pending   10/14 MRSA PCR: negative  Thank you for allowing pharmacy to be a part of this patient's care.  Ramond Craver 12/06/2017 8:20 AM

## 2017-12-06 NOTE — Progress Notes (Signed)
Inpatient Diabetes Program Recommendations  AACE/ADA: New Consensus Statement on Inpatient Glycemic Control (2015)  Target Ranges:  Prepandial:   less than 140 mg/dL      Peak postprandial:   less than 180 mg/dL (1-2 hours)      Critically ill patients:  140 - 180 mg/dL   Results for CELE, MOTE (MRN [id number]) as of 12/06/2017 07:42  Ref. Range 12/05/2017 11:15 12/05/2017 14:58 12/05/2017 16:28 12/05/2017 17:32 12/05/2017 18:33 12/05/2017 19:31 12/05/2017 20:41 12/05/2017 21:36  Glucose-Capillary Latest Ref Range: 70 - 99 mg/dL 564 (HH) 466 (H)  IV Insulin Drip 420 (H)  IV Insulin Drip 285 (H)  IV Insulin Drip 193 (H)  IV Insulin Drip 112 (H)  IV Insulin Drip +  14 units LANTUS given at 1935 100 (H) 114 (H)   Results for DANAYAH, SMYRE (MRN [id number]) as of 12/06/2017 07:42  Ref. Range 12/06/2017 03:33 12/06/2017 07:41  Glucose-Capillary Latest Ref Range: 70 - 99 mg/dL 143 (H) 160 (H)   Results for CHALYN, AMESCUA (MRN [id number]) as of 12/06/2017 07:42  Ref. Range 12/05/2017 09:22  Hemoglobin A1C Latest Ref Range: 4.8 - 5.6 % 12.7 (H)  (317 mg/dl)     Admit with Glucose 621 mg/dl/ HHNK/ New Diagnosis of DM  PCP: Dr. Luan Pulling in [place].   Resides at [facility]  Current Orders: Lantus 14 units Daily      Novolog Sensitive Correction Scale/ SSI (0-9 units) TID AC + HS      Transitioned off the IV Insulin drip yesterday around 7pm.  Lantus 14 units given at 7:30pm last night.  New diagnosis of DM.    Will attempt to speak with pt's family today to discuss new diagnosis.  She resides in SNF, so she will receive assistance with her DM care after d/c.     --Will follow patient during hospitalization--  Wyn Quaker RN, MSN, CDE Diabetes Coordinator Inpatient Glycemic Control Team Team Pager: [phone] (8a-5p)

## 2017-12-06 NOTE — Progress Notes (Signed)
Subjective: She was admitted yesterday with new onset diabetes with hyperglycemia and hyperosmolar state.  She also appears to have right lower lobe pneumonia.  Chest x-ray done today is more distinct and her CT done yesterday showed evidence of pneumonia on abdominal pelvic CT.  I have her personally reviewed both films and the original chest x-ray from yesterday.  This morning she is sleepy but arousable.  She is done much better with her blood sugars.  No new complaints.  No new problems noted by the nursing staff.  Objective: Vital signs in last 24 hours: Temp:  [93.4 F (34.1 C)-98.3 F (36.8 C)] 97.6 F (36.4 C) (10/15 0739) Pulse Rate:  [67-94] 80 (10/14 2300) Resp:  [14-35] 17 (10/14 2300) BP: (94-130)/(44-96) 96/44 (10/14 2300) SpO2:  [93 %-98 %] 97 % (10/14 2300) Weight:  [78.1 kg-84 kg] 78.7 kg (10/15 0500) Weight change:  Last BM Date: (Patient doesn't remember/know)  Intake/Output from previous day: 10/14 0701 - 10/15 0700 In: 1999.9 [I.V.:1156.2; IV Piggyback:843.7] Out: 600 [Urine:600]  PHYSICAL EXAM General appearance: alert and cooperative Resp: She has rales in the right base Cardio: regular rate and rhythm, S1, S2 normal, no murmur, click, rub or gallop GI: soft, non-tender; bowel sounds normal; no masses,  no organomegaly Extremities: She has chronic edema of both legs and is wearing compression stockings  Lab Results:  Results for orders placed or performed during the hospital encounter of 12/05/17 (from the past 48 hour(s))  CBG monitoring, ED     Status: Abnormal   Collection Time: 12/05/17  9:03 AM  Result Value Ref Range   Glucose-Capillary >600 (HH) 70 - 99 mg/dL  Urinalysis, Routine w reflex microscopic     Status: Abnormal   Collection Time: 12/05/17  9:16 AM  Result Value Ref Range   Color, Urine YELLOW YELLOW   APPearance CLEAR CLEAR   Specific Gravity, Urine 1.021 1.005 - 1.030   pH 6.0 5.0 - 8.0   Glucose, UA >=500 (A) NEGATIVE mg/dL   Hgb  urine dipstick LARGE (A) NEGATIVE   Bilirubin Urine NEGATIVE NEGATIVE   Ketones, ur NEGATIVE NEGATIVE mg/dL   Protein, ur NEGATIVE NEGATIVE mg/dL   Nitrite NEGATIVE NEGATIVE   Leukocytes, UA NEGATIVE NEGATIVE   RBC / HPF 21-50 0 - 5 RBC/hpf   WBC, UA 0-5 0 - 5 WBC/hpf   Bacteria, UA NONE SEEN NONE SEEN   Squamous Epithelial / LPF 0-5 0 - 5   Mucus PRESENT    Hyaline Casts, UA PRESENT    Uric Acid Crys, UA PRESENT     Comment: Performed at [facility], [address] [place], [place] [address]  Blood gas, venous     Status: None   Collection Time: 12/05/17  9:17 AM  Result Value Ref Range   pH, Ven 7.350 7.250 - 7.430   pCO2, Ven 47.4 44.0 - 60.0 mmHg   pO2, Ven 42.0 32.0 - 45.0 mmHg   Bicarbonate 24.1 20.0 - 28.0 mmol/L   Acid-Base Excess 0.6 0.0 - 2.0 mmol/L   O2 Saturation 70.8 %   Drawn by COLLECTED BY LABORATORY    Sample type VEIN     Comment: Performed at [facility], [address] [place], [place] [address]  CBC with Differential     Status: Abnormal   Collection Time: 12/05/17  9:22 AM  Result Value Ref Range   WBC 4.9 4.0 - 10.5 K/uL   RBC 3.96 3.87 - 5.11 MIL/uL   Hemoglobin 11.0 (  L) 12.0 - 15.0 g/dL   HCT 33.9 (L) 36.0 - 46.0 %   MCV 85.6 80.0 - 100.0 fL   MCH 27.8 26.0 - 34.0 pg   MCHC 32.4 30.0 - 36.0 g/dL   RDW 16.1 (H) 11.5 - 15.5 %   Platelets 212 150 - 400 K/uL   nRBC 0.0 0.0 - 0.2 %   Neutrophils Relative % 65 %   Neutro Abs 3.2 1.7 - 7.7 K/uL   Lymphocytes Relative 22 %   Lymphs Abs 1.1 0.7 - 4.0 K/uL   Monocytes Relative 11 %   Monocytes Absolute 0.5 0.1 - 1.0 K/uL   Eosinophils Relative 1 %   Eosinophils Absolute 0.1 0.0 - 0.5 K/uL   Basophils Relative 0 %   Basophils Absolute 0.0 0.0 - 0.1 K/uL   Immature Granulocytes 1 %   Abs Immature Granulocytes 0.03 0.00 - 0.07 K/uL    Comment: Performed at [facility], [address], [place], [place] [address]  Comprehensive metabolic panel     Status: Abnormal   Collection Time: 12/05/17  9:22 AM   Result Value Ref Range   Sodium 129 (L) 135 - 145 mmol/L   Potassium 3.9 3.5 - 5.1 mmol/L   Chloride 92 (L) 98 - 111 mmol/L   CO2 27 22 - 32 mmol/L   Glucose, Bld 621 (HH) 70 - 99 mg/dL    Comment: CRITICAL RESULT CALLED TO, READ BACK BY AND VERIFIED WITH: CREWS,M AT 9:55AM ON 12/05/17 BY FESTERMAN,C    BUN 34 (H) 8 - 23 mg/dL   Creatinine, Ser 1.62 (H) 0.44 - 1.00 mg/dL   Calcium 8.8 (L) 8.9 - 10.3 mg/dL   Total Protein 7.7 6.5 - 8.1 g/dL   Albumin 3.3 (L) 3.5 - 5.0 g/dL   AST 24 15 - 41 U/L   ALT 21 0 - 44 U/L   Alkaline Phosphatase 166 (H) 38 - 126 U/L   Total Bilirubin 0.7 0.3 - 1.2 mg/dL   GFR calc non Af Amer 25 (L) >60 mL/min   GFR calc Af Amer 29 (L) >60 mL/min    Comment: (NOTE) The eGFR has been calculated using the CKD EPI equation. This calculation has not been validated in all clinical situations. eGFR's persistently <60 mL/min signify possible Chronic Kidney Disease.    Anion gap 10 5 - 15    Comment: Performed at [facility], [address] [place], [place] [address]  Lipase, blood     Status: None   Collection Time: 12/05/17  9:22 AM  Result Value Ref Range   Lipase 28 11 - 51 U/L    Comment: Performed at [facility], [address] [place], [place] [address]  Culture, blood (routine x 2)     Status: None (Preliminary result)   Collection Time: 12/05/17  9:22 AM  Result Value Ref Range   Specimen Description BLOOD RIGHT ANTECUBITAL    Special Requests      BOTTLES DRAWN AEROBIC AND ANAEROBIC Blood Culture adequate volume   Culture      NO GROWTH < 24 HOURS Performed at [facility], [address], [place], [place] [address]    Report Status PENDING   TSH     Status: Abnormal   Collection Time: 12/05/17  9:22 AM  Result Value Ref Range   TSH 13.742 (H) 0.350 - 4.500 uIU/mL    Comment: Performed by a 3rd Generation assay with a functional sensitivity of <=0.01 uIU/mL. Performed at [facility], [address], [place], [place] [address]   Hemoglobin  A1c     Status: Abnormal   Collection Time: 12/05/17  9:22 AM  Result Value Ref Range   Hgb A1c MFr Bld 12.7 (H) 4.8 - 5.6 %    Comment: (NOTE) Pre diabetes:          5.7%-6.4% Diabetes:              >6.4% Glycemic control for   <7.0% adults with diabetes    Mean Plasma Glucose 317.79 mg/dL    Comment: Performed at [place] [address], [place], [place] [address]  T4, free     Status: None   Collection Time: 12/05/17  9:22 AM  Result Value Ref Range   Free T4 1.11 0.82 - 1.77 ng/dL    Comment: (NOTE) Biotin ingestion may interfere with free T4 tests. If the results are inconsistent with the TSH level, previous test results, or the clinical presentation, then consider biotin interference. If needed, order repeat testing after stopping biotin. Performed at [facility], [place] [address], [place], [place] [address]   Culture, blood (routine x 2)     Status: None (Preliminary result)   Collection Time: 12/05/17 10:14 AM  Result Value Ref Range   Specimen Description BLOOD BLOOD RIGHT FOREARM    Special Requests      BOTTLES DRAWN AEROBIC ONLY Blood Culture results may not be optimal due to an inadequate volume of blood received in culture bottles   Culture      NO GROWTH < 24 HOURS Performed at [facility], [address], [place], [place] [address]    Report Status PENDING   POC CBG, ED     Status: Abnormal   Collection Time: 12/05/17 11:15 AM  Result Value Ref Range   Glucose-Capillary 564 (HH) 70 - 99 mg/dL   Comment 1 Call MD NNP PA CNM   I-Stat CG4 Lactic Acid, ED     Status: None   Collection Time: 12/05/17 11:29 AM  Result Value Ref Range   Lactic Acid, Venous 0.80 0.5 - 1.9 mmol/L  Glucose, capillary     Status: Abnormal   Collection Time: 12/05/17  2:58 PM  Result Value Ref Range   Glucose-Capillary 466 (H) 70 - 99 mg/dL  Glucose, capillary     Status: Abnormal   Collection Time: 12/05/17  4:28 PM  Result Value Ref Range   Glucose-Capillary  420 (H) 70 - 99 mg/dL  MRSA PCR Screening     Status: None   Collection Time: 12/05/17  5:00 PM  Result Value Ref Range   MRSA by PCR NEGATIVE NEGATIVE    Comment:        The GeneXpert MRSA Assay (FDA approved for NASAL specimens only), is one component of a comprehensive MRSA colonization surveillance program. It is not intended to diagnose MRSA infection nor to guide or monitor treatment for MRSA infections. Performed at [facility], [address], [place], [place] [address]   Glucose, capillary     Status: Abnormal   Collection Time: 12/05/17  5:32 PM  Result Value Ref Range   Glucose-Capillary 285 (H) 70 - 99 mg/dL  Glucose, capillary     Status: Abnormal   Collection Time: 12/05/17  6:33 PM  Result Value Ref Range   Glucose-Capillary 193 (H) 70 - 99 mg/dL  Glucose, capillary     Status: Abnormal   Collection Time: 12/05/17  7:31 PM  Result Value Ref Range   Glucose-Capillary 112 (H) 70 - 99 mg/dL  Glucose, capillary     Status: Abnormal   Collection Time: 12/05/17  8:41 PM  Result Value Ref Range   Glucose-Capillary 100 (H) 70 - 99 mg/dL  Glucose, capillary     Status: Abnormal   Collection Time: 12/05/17  9:36 PM  Result Value Ref Range   Glucose-Capillary 114 (H) 70 - 99 mg/dL  Glucose, capillary     Status: Abnormal   Collection Time: 12/06/17  3:33 AM  Result Value Ref Range   Glucose-Capillary 143 (H) 70 - 99 mg/dL  Basic metabolic panel     Status: Abnormal   Collection Time: 12/06/17  3:39 AM  Result Value Ref Range   Sodium 136 135 - 145 mmol/L    Comment: DELTA CHECK NOTED   Potassium 4.1 3.5 - 5.1 mmol/L   Chloride 106 98 - 111 mmol/L   CO2 23 22 - 32 mmol/L   Glucose, Bld 150 (H) 70 - 99 mg/dL   BUN 26 (H) 8 - 23 mg/dL   Creatinine, Ser 1.33 (H) 0.44 - 1.00 mg/dL   Calcium 8.4 (L) 8.9 - 10.3 mg/dL   GFR calc non Af Amer 32 (L) >60 mL/min   GFR calc Af Amer 37 (L) >60 mL/min    Comment: (NOTE) The eGFR has been calculated using the CKD EPI  equation. This calculation has not been validated in all clinical situations. eGFR's persistently <60 mL/min signify possible Chronic Kidney Disease.    Anion gap 7 5 - 15    Comment: Performed at [facility], [address] [place], [place] [address]  Magnesium     Status: None   Collection Time: 12/06/17  3:39 AM  Result Value Ref Range   Magnesium 2.4 1.7 - 2.4 mg/dL    Comment: Performed at [facility], [address], [place], [place] [address]  CBC WITH DIFFERENTIAL     Status: Abnormal   Collection Time: 12/06/17  3:39 AM  Result Value Ref Range   WBC 6.0 4.0 - 10.5 K/uL   RBC 3.62 (L) 3.87 - 5.11 MIL/uL   Hemoglobin 10.1 (L) 12.0 - 15.0 g/dL   HCT 31.7 (L) 36.0 - 46.0 %   MCV 87.6 80.0 - 100.0 fL   MCH 27.9 26.0 - 34.0 pg   MCHC 31.9 30.0 - 36.0 g/dL   RDW 16.4 (H) 11.5 - 15.5 %   Platelets 210 150 - 400 K/uL   nRBC 0.0 0.0 - 0.2 %   Neutrophils Relative % 61 %   Neutro Abs 3.7 1.7 - 7.7 K/uL   Lymphocytes Relative 27 %   Lymphs Abs 1.6 0.7 - 4.0 K/uL   Monocytes Relative 9 %   Monocytes Absolute 0.5 0.1 - 1.0 K/uL   Eosinophils Relative 2 %   Eosinophils Absolute 0.1 0.0 - 0.5 K/uL   Basophils Relative 1 %   Basophils Absolute 0.0 0.0 - 0.1 K/uL   Immature Granulocytes 0 %   Abs Immature Granulocytes 0.02 0.00 - 0.07 K/uL    Comment: Performed at [facility], [address] [place], [place] [address]  Glucose, capillary     Status: Abnormal   Collection Time: 12/06/17  7:41 AM  Result Value Ref Range   Glucose-Capillary 160 (H) 70 - 99 mg/dL    ABGS Recent Labs    12/05/17 0917  HCO3 24.1   CULTURES Recent Results (from the past 240 hour(s))  Culture, blood (routine x 2)     Status: None (Preliminary result)   Collection Time: 12/05/17  9:22 AM  Result Value Ref Range Status   Specimen Description BLOOD RIGHT ANTECUBITAL  Final   Special Requests   Final    BOTTLES DRAWN AEROBIC AND ANAEROBIC Blood Culture adequate volume   Culture   Final    NO  GROWTH < 24 HOURS Performed at [facility], [address] [place], [place] [address]    Report Status PENDING  Incomplete  Culture, blood (routine x 2)     Status: None (Preliminary result)   Collection Time: 12/05/17 10:14 AM  Result Value Ref Range Status   Specimen Description BLOOD BLOOD RIGHT FOREARM  Final   Special Requests   Final    BOTTLES DRAWN AEROBIC ONLY Blood Culture results may not be optimal due to an inadequate volume of blood received in culture bottles   Culture   Final    NO GROWTH < 24 HOURS Performed at [facility], [address], [place], [place] [address]    Report Status PENDING  Incomplete  MRSA PCR Screening     Status: None   Collection Time: 12/05/17  5:00 PM  Result Value Ref Range Status   MRSA by PCR NEGATIVE NEGATIVE Final    Comment:        The GeneXpert MRSA Assay (FDA approved for NASAL specimens only), is one component of a comprehensive MRSA colonization surveillance program. It is not intended to diagnose MRSA infection nor to guide or monitor treatment for MRSA infections. Performed at [facility], [address], [place], [place] [address]    Studies/Results: Ct Abdomen Pelvis Wo Contrast  Result Date: 12/05/2017 CLINICAL DATA:  Weakness, abdominal pain and malaise for the past 3 days. EXAM: CT ABDOMEN AND PELVIS WITHOUT CONTRAST TECHNIQUE: Multidetector CT imaging of the abdomen and pelvis was performed following the standard protocol without IV contrast. COMPARISON:  CXR 12/05/2017 FINDINGS: Lower chest: Top-normal size heart without pericardial effusion or thickening. Left main and three-vessel coronary arteriosclerosis is noted. There is thoracic aortic atherosclerosis. There are mitral valvular and annular calcifications. Pulmonary consolidation consistent with atelectasis and/or pneumonia in the posterior right lower lobe. Hepatobiliary: Cholecystectomy. The unenhanced liver is unremarkable. Pancreas: Atrophic without inflammation  or mass. Spleen: Normal Adrenals/Urinary Tract: Normal bilateral adrenal glands. Simple bilateral renal cysts arising off both kidneys with homogeneous hyperdense 6 mm lesion off the interpolar right kidney likely to represent a small proteinaceous or hemorrhagic cyst. No worrisome features are noted. No nephrolithiasis nor hydroureteronephrosis. The urinary bladder is unremarkable for the degree of distention. Stomach/Bowel: Small hiatal hernia. Decompressed stomach. The duodenal sweep and ligament of Treitz are unremarkable. Scattered colonic diverticulosis without acute diverticulitis. Large amount of retained stool throughout the colon consistent with constipation. Vascular/Lymphatic: Moderate aortoiliac and branch vessel atherosclerosis. No aortic aneurysm. No adenopathy. Reproductive: Status post hysterectomy. No adnexal masses. Other: No free air nor free fluid. Musculoskeletal: Multilevel degenerative facet arthropathy of the lumbar spine. No acute osseous abnormality. IMPRESSION: 1. Pulmonary consolidation in the right lower lobe. Atelectasis versus pneumonia or combination thereof. 2. Increased fecal retention throughout the colon consistent with constipation with scattered colonic diverticulosis. No diverticulitis. No abscess. 3. Simple and complex renal cysts. 4. Coronary arteriosclerosis and aortic atherosclerosis. Electronically Signed   By: Ashley Royalty M.D.   On: 12/05/2017 16:32   Dg Chest 2 View  Result Date: 12/05/2017 CLINICAL DATA:  Altered mental status EXAM: CHEST - 2  VIEW COMPARISON:  09/30/2017 FINDINGS: Bibasilar atelectasis. Mild cardiomegaly. No visible effusions or overt edema. No acute bony abnormality. IMPRESSION: Cardiomegaly.  Bibasilar atelectasis. Electronically Signed   By: Rolm Baptise M.D.   On: 12/05/2017 10:06   Ct Head Wo Contrast  Result Date: 12/05/2017 CLINICAL DATA:  Altered level of consciousness. EXAM: CT HEAD WITHOUT CONTRAST TECHNIQUE: Contiguous axial images  were obtained from the base of the skull through the vertex without intravenous contrast. COMPARISON:  08/24/2017 FINDINGS: Brain: There is atrophy and chronic small vessel disease changes. No acute intracranial abnormality. Specifically, no hemorrhage, hydrocephalus, mass lesion, acute infarction, or significant intracranial injury. Vascular: No hyperdense vessel or unexpected calcification. Skull: No acute calvarial abnormality. Sinuses/Orbits: Visualized paranasal sinuses and mastoids clear. Orbital soft tissues unremarkable. Other: None IMPRESSION: No acute intracranial abnormality. Atrophy, chronic microvascular disease. Electronically Signed   By: Rolm Baptise M.D.   On: 12/05/2017 09:55    Medications:  Prior to Admission:  Medications Prior to Admission  Medication Sig Dispense Refill Last Dose  . apixaban (ELIQUIS) 2.5 MG TABS tablet Take 1 tablet (2.5 mg total) by mouth 2 (two) times daily. 60 tablet 5 12/04/2017 at Unknown time  . carboxymethylcellulose (REFRESH PLUS) 0.5 % SOLN Place 1 drop into both eyes 4 (four) times daily.   12/05/2017 at Unknown time  . cephALEXin (KEFLEX) 500 MG capsule Take 500 mg by mouth 3 (three) times daily.   12/05/2017 at Unknown time  . dextromethorphan-guaiFENesin (ROBITUSSIN-DM) 10-100 MG/5ML liquid Take 5 mLs by mouth every 4 (four) hours as needed for cough.   unknown  . ferrous sulfate 325 (65 FE) MG tablet Take 1 tablet (325 mg total) by mouth daily. 30 tablet 3 12/04/2017 at Unknown time  . furosemide (LASIX) 20 MG tablet Take 20 mg by mouth. Give  3 tablets by mouth one time a day for  CHF   12/04/2017 at Unknown time  . furosemide (LASIX) 40 MG tablet TAKE 1 TABLET BY MOUTH TWICE DAILY. (Patient taking differently: Take 40 mg by mouth 2 (two) times daily. Give one tablet by mouth at bedtime for CHF) 180 tablet 0 12/04/2017 at Unknown time  . levothyroxine (SYNTHROID, LEVOTHROID) 88 MCG tablet Take 88 mcg by mouth daily before breakfast.   12/05/2017  at Unknown time  . LINZESS 290 MCG CAPS capsule Take 290 mcg by mouth daily before breakfast.    12/05/2017 at Unknown time  . neomycin-polymyxin-dexameth (MAXITROL) 0.1 % OINT Place 1 application into both eyes 3 (three) times daily. For dry eyes   12/05/2017 at Unknown time  . NIFEdipine (PROCARDIA-XL/ADALAT CC) 30 MG 24 hr tablet Take 30 mg by mouth daily.   12/04/2017 at Unknown time  . omeprazole (PRILOSEC) 20 MG capsule Take 20 mg by mouth 2 (two) times daily before a meal.    12/04/2017 at Unknown time  . oxybutynin (DITROPAN XL) 15 MG 24 hr tablet Take 15 mg by mouth daily.   12/04/2017 at Unknown time  . polyethylene glycol powder (GLYCOLAX/MIRALAX) powder Take 8.6 g by mouth daily.    12/05/2017 at Unknown time  . pravastatin (PRAVACHOL) 40 MG tablet Take 40 mg by mouth at bedtime.    12/04/2017 at Unknown time  . risperiDONE (RISPERDAL) 0.25 MG tablet Take 0.25 mg by mouth daily.   12/04/2017 at Unknown time  . risperiDONE (RISPERDAL) 0.5 MG tablet Take 0.5 mg by mouth at bedtime.   12/04/2017 at Unknown time  . rOPINIRole (REQUIP) 0.5 MG tablet Take  0.5 mg by mouth at bedtime.   12/04/2017 at Unknown time  . traZODone (DESYREL) 50 MG tablet Take 25 mg by mouth at bedtime.   12/04/2017 at Unknown time  . umeclidinium-vilanterol (ANORO ELLIPTA) 62.5-25 MCG/INH AEPB Inhale 1 puff into the lungs daily.   12/04/2017 at Unknown time  . acetaminophen (TYLENOL) 325 MG tablet Take 650 mg by mouth every 6 (six) hours as needed for mild pain or moderate pain.    unknown  . albuterol (PROVENTIL HFA;VENTOLIN HFA) 108 (90 Base) MCG/ACT inhaler Inhale 2 puffs into the lungs every 6 (six) hours as needed for wheezing or shortness of breath. 1 Inhaler 2 unknown  . gabapentin (NEURONTIN) 600 MG tablet Take 600 mg by mouth at bedtime.    Not Taking at Unknown time  . ketoconazole (NIZORAL) 2 % shampoo Apply 1 application topically See admin instructions. Once weekly As needed for antifungal.   unknown  .  ketorolac (TORADOL) 10 MG tablet Take 10 mg by mouth every 6 (six) hours as needed for moderate pain (to hand).    unknown  . silver sulfADIAZINE (SILVADENE) 1 % cream Apply 1 application topically daily. Left toe   unknown  . trolamine salicylate (ASPERCREME) 10 % cream Apply 1 application topically every 12 (twelve) hours as needed for muscle pain.    unknown   Scheduled: . apixaban  2.5 mg Oral BID  . ferrous sulfate  325 mg Oral Daily  . gabapentin  600 mg Oral QHS  . insulin aspart  0-5 Units Subcutaneous QHS  . insulin aspart  0-9 Units Subcutaneous TID WC  . insulin glargine  14 Units Subcutaneous Daily  . levothyroxine  88 mcg Oral QAC breakfast  . linaclotide  290 mcg Oral QAC breakfast  . NIFEdipine  30 mg Oral Daily  . oxybutynin  15 mg Oral Daily  . polyethylene glycol  17 g Oral Daily  . pravastatin  40 mg Oral q1800  . risperiDONE  0.25 mg Oral Daily  . risperiDONE  0.5 mg Oral QHS  . rOPINIRole  0.5 mg Oral QHS  . traZODone  25 mg Oral QHS  . umeclidinium-vilanterol  1 puff Inhalation Daily   Continuous: . 0.9 % NaCl with KCl 40 mEq / L 50 mL/hr at 12/06/17 0703  . famotidine (PEPCID) IV Stopped (12/05/17 1654)  . piperacillin-tazobactam (ZOSYN)  IV Stopped (12/06/17 0430)   ZCH:YIFOYDXAJOINO **OR** acetaminophen, iopamidol, ondansetron **OR** ondansetron (ZOFRAN) IV, polyethylene glycol, polyvinyl alcohol  Assesment: She was admitted with hyperglycemic hyperosmolar nonketotic coma.  Her blood sugar was greater than 600 and she has not had trouble with diabetes in the past.  Her blood sugar is much better.  She is more alert but still sleepy.  She has right lower lobe pneumonia which is being treated appropriately  She has been dehydrated and I think that is better  She has acute kidney injury on top of chronic kidney disease stage III  She has chronic diastolic heart failure so we have to be careful about fluid resuscitation.  She has had severe problems with  anxiety for many years and that is pretty well controlled  She has had general failure to thrive over the last year or so  She has acute on chronic hypoxic respiratory failure on oxygen  She is had recurrent DVT and is chronically anticoagulated  She was hypothermic on admission multifactorial and that is better  She has multisystem problems and is severely acutely and chronically ill with  complex decision-making required Principal Problem:   Hyperglycemic hyperosmolar nonketotic coma (HCC) Active Problems:   Dehydration   Failure to thrive in adult   Hypothyroidism   Acute lower UTI   AKI (acute kidney injury) ([place])   Chronic diastolic CHF (congestive heart failure) (HCC)   Chronic kidney disease (CKD), stage III (moderate) (HCC)   GERD (gastroesophageal reflux disease)   Hypothermia   DNR (do not resuscitate)    Plan: Continue current treatments.  Continue fluids at current rate.  She is on sliding scale insulin and basal insulin now.  She is on appropriate antibiotics    LOS: 1 day   Margaretha Mahan L 12/06/2017, 7:48 AM

## 2017-12-07 LAB — GLUCOSE, CAPILLARY
GLUCOSE-CAPILLARY: 203 mg/dL — AB (ref 70–99)
GLUCOSE-CAPILLARY: 190 mg/dL — AB (ref 70–99)
GLUCOSE-CAPILLARY: 271 mg/dL — AB (ref 70–99)
Glucose-Capillary: 332 mg/dL — ABNORMAL HIGH (ref 70–99)
Glucose-Capillary: 277 mg/dL — ABNORMAL HIGH (ref 70–99)

## 2017-12-07 LAB — CBC WITH DIFFERENTIAL/PLATELET
ABS IMMATURE GRANULOCYTES: 0.03 10*3/uL (ref 0.00–0.07)
BASOS ABS: 0 10*3/uL (ref 0.0–0.1)
Basophils Relative: 0 %
Eosinophils Absolute: 0.1 10*3/uL (ref 0.0–0.5)
Eosinophils Relative: 2 %
HCT: 32.9 % — ABNORMAL LOW (ref 36.0–46.0)
HEMOGLOBIN: 10.2 g/dL — AB (ref 12.0–15.0)
Immature Granulocytes: 1 %
LYMPHS ABS: 1.5 10*3/uL (ref 0.7–4.0)
LYMPHS PCT: 30 %
MCH: 26.7 pg (ref 26.0–34.0)
MCHC: 31 g/dL (ref 30.0–36.0)
MCV: 86.1 fL (ref 80.0–100.0)
MONO ABS: 0.5 10*3/uL (ref 0.1–1.0)
Monocytes Relative: 9 %
NEUTROS ABS: 2.8 10*3/uL (ref 1.7–7.7)
Neutrophils Relative %: 58 %
Platelets: 211 10*3/uL (ref 150–400)
RBC: 3.82 MIL/uL — ABNORMAL LOW (ref 3.87–5.11)
RDW: 16.3 % — ABNORMAL HIGH (ref 11.5–15.5)
WBC: 4.9 10*3/uL (ref 4.0–10.5)
nRBC: 0 % (ref 0.0–0.2)

## 2017-12-07 LAB — PHOSPHORUS: PHOSPHORUS: 3.1 mg/dL (ref 2.5–4.6)

## 2017-12-07 LAB — MAGNESIUM: Magnesium: 2 mg/dL (ref 1.7–2.4)

## 2017-12-07 LAB — BASIC METABOLIC PANEL
Anion gap: 6 (ref 5–15)
BUN: 17 mg/dL (ref 8–23)
CHLORIDE: 104 mmol/L (ref 98–111)
CO2: 23 mmol/L (ref 22–32)
CREATININE: 1.13 mg/dL — AB (ref 0.44–1.00)
Calcium: 9.1 mg/dL (ref 8.9–10.3)
GFR calc Af Amer: 45 mL/min — ABNORMAL LOW (ref >60)
GFR calc non Af Amer: 39 mL/min — ABNORMAL LOW (ref >60)
GLUCOSE: 212 mg/dL — AB (ref 70–99)
Potassium: 4.1 mmol/L (ref 3.5–5.1)
Sodium: 133 mmol/L — ABNORMAL LOW (ref 135–145)

## 2017-12-07 MED ORDER — MAGIC MOUTHWASH W/LIDOCAINE
10.0000 mL | Freq: Four times a day (QID) | ORAL | Status: DC | PRN
  Filled 2017-12-07: qty 10

## 2017-12-07 NOTE — Progress Notes (Signed)
Ms. Mccarthy gave me permission to call her niece [name] to discuss her care and new diagnosis of DM.  Called [name] today and left voicemail to call me back.  [name] called me back around 3pm today.  Discussed with [name] pt's new diagnosis of DM.  Gave basic explanation of how diabetes affects the body, complications, symptoms of high blood sugar, etc.  Explained to [name] that we are currently administering two kinds of insulin to patient (Lantus and Novolog).  Explained what these insulins are, how we give them, and how much we are currently giving.    [name] told me that patient gets assistance with meds at the [facility] she [facility] in.  [name] was not 100% sure that the RNs at the ALF would give pt her insulin.  [name] would like to speak with the RN and the Social Worker to see if pt will need higher level of care after discharge, b/c she is concerned that pt cannot give herself insulin at the ALF (if the RNs at the ALF will not be able to give the pt insulin).  Gave [name] the main number for [facility] and encouraged her to call the RN and speak with the RN about her concerns.  [name] was very appreciative of all the information I gave her.  Plans to call [facility] at [facility] after our conversation today.     --Will follow patient during hospitalization--  Wyn Quaker RN, MSN, CDE Diabetes Coordinator Inpatient Glycemic Control Team Team Pager: [phone] (8a-5p)

## 2017-12-07 NOTE — Progress Notes (Signed)
Subjective: She was admitted with nonketotic hyperosmolar coma with new onset of diabetes.  She also has pneumonia.  This morning she looks substantially better.  She is less confused.  She is not coughing as much.  Blood sugar is better.  Objective: Vital signs in last 24 hours: Temp:  [97.5 F (36.4 C)-98.1 F (36.7 C)] 97.5 F (36.4 C) (10/16 0723) Pulse Rate:  [68-90] 74 (10/16 0723) Resp:  [13-39] 28 (10/16 0723) BP: (86-165)/(50-144) 121/61 (10/16 0700) SpO2:  [89 %-99 %] 99 % (10/16 0723) Weight:  [78.7 kg] 78.7 kg (10/16 0213) Weight change: -5.3 kg Last BM Date: 12/06/17  Intake/Output from previous day: 10/15 0701 - 10/16 0700 In: 1516.6 [P.O.:320; I.V.:896; IV Piggyback:300.6] Out: 1400 [Urine:1400]  PHYSICAL EXAM General appearance: alert, cooperative and Not as confused Resp: rhonchi bilaterally Cardio: regular rate and rhythm, S1, S2 normal, no murmur, click, rub or gallop GI: soft, non-tender; bowel sounds normal; no masses,  no organomegaly Extremities: She has chronic edema from venous stasis  Lab Results:  Results for orders placed or performed during the hospital encounter of 12/05/17 (from the past 48 hour(s))  CBG monitoring, ED     Status: Abnormal   Collection Time: 12/05/17  9:03 AM  Result Value Ref Range   Glucose-Capillary >600 (HH) 70 - 99 mg/dL  Urinalysis, Routine w reflex microscopic     Status: Abnormal   Collection Time: 12/05/17  9:16 AM  Result Value Ref Range   Color, Urine YELLOW YELLOW   APPearance CLEAR CLEAR   Specific Gravity, Urine 1.021 1.005 - 1.030   pH 6.0 5.0 - 8.0   Glucose, UA >=500 (A) NEGATIVE mg/dL   Hgb urine dipstick LARGE (A) NEGATIVE   Bilirubin Urine NEGATIVE NEGATIVE   Ketones, ur NEGATIVE NEGATIVE mg/dL   Protein, ur NEGATIVE NEGATIVE mg/dL   Nitrite NEGATIVE NEGATIVE   Leukocytes, UA NEGATIVE NEGATIVE   RBC / HPF 21-50 0 - 5 RBC/hpf   WBC, UA 0-5 0 - 5 WBC/hpf   Bacteria, UA NONE SEEN NONE SEEN   Squamous  Epithelial / LPF 0-5 0 - 5   Mucus PRESENT    Hyaline Casts, UA PRESENT    Uric Acid Crys, UA PRESENT     Comment: Performed at [facility], [address] [place], [place] [address]  Urine culture     Status: Abnormal   Collection Time: 12/05/17  9:16 AM  Result Value Ref Range   Specimen Description      URINE, CATHETERIZED Performed at [facility], [address] [place], [place] [address]    Special Requests      NONE Performed at [facility], [address], [place], [place] [address]    Culture MULTIPLE SPECIES PRESENT, SUGGEST RECOLLECTION (A)    Report Status 12/06/2017 FINAL   Blood gas, venous     Status: None   Collection Time: 12/05/17  9:17 AM  Result Value Ref Range   pH, Ven 7.350 7.250 - 7.430   pCO2, Ven 47.4 44.0 - 60.0 mmHg   pO2, Ven 42.0 32.0 - 45.0 mmHg   Bicarbonate 24.1 20.0 - 28.0 mmol/L   Acid-Base Excess 0.6 0.0 - 2.0 mmol/L   O2 Saturation 70.8 %   Drawn by COLLECTED BY LABORATORY    Sample type VEIN     Comment: Performed at [facility], [address] [place], [place] [address]  CBC with Differential     Status: Abnormal   Collection Time: 12/05/17  9:22 AM  Result Value Ref Range   WBC 4.9 4.0 - 10.5 K/uL   RBC 3.96 3.87 - 5.11 MIL/uL   Hemoglobin 11.0 (L) 12.0 - 15.0 g/dL   HCT 33.9 (L) 36.0 - 46.0 %   MCV 85.6 80.0 - 100.0 fL   MCH 27.8 26.0 - 34.0 pg   MCHC 32.4 30.0 - 36.0 g/dL   RDW 16.1 (H) 11.5 - 15.5 %   Platelets 212 150 - 400 K/uL   nRBC 0.0 0.0 - 0.2 %   Neutrophils Relative % 65 %   Neutro Abs 3.2 1.7 - 7.7 K/uL   Lymphocytes Relative 22 %   Lymphs Abs 1.1 0.7 - 4.0 K/uL   Monocytes Relative 11 %   Monocytes Absolute 0.5 0.1 - 1.0 K/uL   Eosinophils Relative 1 %   Eosinophils Absolute 0.1 0.0 - 0.5 K/uL   Basophils Relative 0 %   Basophils Absolute 0.0 0.0 - 0.1 K/uL   Immature Granulocytes 1 %   Abs Immature Granulocytes 0.03 0.00 - 0.07 K/uL    Comment: Performed at [facility], [address], [place], [place]  [address]  Comprehensive metabolic panel     Status: Abnormal   Collection Time: 12/05/17  9:22 AM  Result Value Ref Range   Sodium 129 (L) 135 - 145 mmol/L   Potassium 3.9 3.5 - 5.1 mmol/L   Chloride 92 (L) 98 - 111 mmol/L   CO2 27 22 - 32 mmol/L   Glucose, Bld 621 (HH) 70 - 99 mg/dL    Comment: CRITICAL RESULT CALLED TO, READ BACK BY AND VERIFIED WITH: CREWS,M AT 9:55AM ON 12/05/17 BY FESTERMAN,C    BUN 34 (H) 8 - 23 mg/dL   Creatinine, Ser 1.62 (H) 0.44 - 1.00 mg/dL   Calcium 8.8 (L) 8.9 - 10.3 mg/dL   Total Protein 7.7 6.5 - 8.1 g/dL   Albumin 3.3 (L) 3.5 - 5.0 g/dL   AST 24 15 - 41 U/L   ALT 21 0 - 44 U/L   Alkaline Phosphatase 166 (H) 38 - 126 U/L   Total Bilirubin 0.7 0.3 - 1.2 mg/dL   GFR calc non Af Amer 25 (L) >60 mL/min   GFR calc Af Amer 29 (L) >60 mL/min    Comment: (NOTE) The eGFR has been calculated using the CKD EPI equation. This calculation has not been validated in all clinical situations. eGFR's persistently <60 mL/min signify possible Chronic Kidney Disease.    Anion gap 10 5 - 15    Comment: Performed at [facility] [address], [place], [place] [address]  Lipase, blood     Status: None   Collection Time: 12/05/17  9:22 AM  Result Value Ref Range   Lipase 28 11 - 51 U/L    Comment: Performed at [facility], [address] [place], [place] [address]  Culture, blood (routine x 2)     Status: None (Preliminary result)   Collection Time: 12/05/17  9:22 AM  Result Value Ref Range   Specimen Description BLOOD RIGHT ANTECUBITAL    Special Requests      BOTTLES DRAWN AEROBIC AND ANAEROBIC Blood Culture adequate volume   Culture      NO GROWTH 2 DAYS Performed at [facility], [address], [place], [place] [address]    Report Status PENDING   TSH     Status: Abnormal   Collection Time: 12/05/17  9:22 AM  Result Value Ref Range   TSH 13.742 (  H) 0.350 - 4.500 uIU/mL    Comment: Performed by a 3rd Generation assay with a functional sensitivity of <=0.01  uIU/mL. Performed at [facility], [address] [place], [place] [address]   Hemoglobin A1c     Status: Abnormal   Collection Time: 12/05/17  9:22 AM  Result Value Ref Range   Hgb A1c MFr Bld 12.7 (H) 4.8 - 5.6 %    Comment: (NOTE) Pre diabetes:          5.7%-6.4% Diabetes:              >6.4% Glycemic control for   <7.0% adults with diabetes    Mean Plasma Glucose 317.79 mg/dL    Comment: Performed at [facility] [address] [place], [place] [address]  T4, free     Status: None   Collection Time: 12/05/17  9:22 AM  Result Value Ref Range   Free T4 1.11 0.82 - 1.77 ng/dL    Comment: (NOTE) Biotin ingestion may interfere with free T4 tests. If the results are inconsistent with the TSH level, previous test results, or the clinical presentation, then consider biotin interference. If needed, order repeat testing after stopping biotin. Performed at [facility], [place] [address], [place], [place] [address]   Culture, blood (routine x 2)     Status: None (Preliminary result)   Collection Time: 12/05/17 10:14 AM  Result Value Ref Range   Specimen Description BLOOD BLOOD RIGHT FOREARM    Special Requests      BOTTLES DRAWN AEROBIC ONLY Blood Culture results may not be optimal due to an inadequate volume of blood received in culture bottles   Culture      NO GROWTH 2 DAYS Performed at [facility], [address], [place], [place] [address]    Report Status PENDING   POC CBG, ED     Status: Abnormal   Collection Time: 12/05/17 11:15 AM  Result Value Ref Range   Glucose-Capillary 564 (HH) 70 - 99 mg/dL   Comment 1 Call MD NNP PA CNM   I-Stat CG4 Lactic Acid, ED     Status: None   Collection Time: 12/05/17 11:29 AM  Result Value Ref Range   Lactic Acid, Venous 0.80 0.5 - 1.9 mmol/L  Glucose, capillary     Status: Abnormal   Collection Time: 12/05/17  2:58 PM  Result Value Ref Range   Glucose-Capillary 466 (H) 70 - 99 mg/dL  Glucose, capillary     Status:  Abnormal   Collection Time: 12/05/17  4:28 PM  Result Value Ref Range   Glucose-Capillary 420 (H) 70 - 99 mg/dL  MRSA PCR Screening     Status: None   Collection Time: 12/05/17  5:00 PM  Result Value Ref Range   MRSA by PCR NEGATIVE NEGATIVE    Comment:        The GeneXpert MRSA Assay (FDA approved for NASAL specimens only), is one component of a comprehensive MRSA colonization surveillance program. It is not intended to diagnose MRSA infection nor to guide or monitor treatment for MRSA infections. Performed at [facility], [address], [place], [place] [address]   Glucose, capillary     Status: Abnormal   Collection Time: 12/05/17  5:32 PM  Result Value Ref Range   Glucose-Capillary 285 (H) 70 - 99 mg/dL  Glucose, capillary     Status: Abnormal   Collection Time: 12/05/17  6:33 PM  Result Value Ref Range  Glucose-Capillary 193 (H) 70 - 99 mg/dL  Glucose, capillary     Status: Abnormal   Collection Time: 12/05/17  7:31 PM  Result Value Ref Range   Glucose-Capillary 112 (H) 70 - 99 mg/dL  Glucose, capillary     Status: Abnormal   Collection Time: 12/05/17  8:41 PM  Result Value Ref Range   Glucose-Capillary 100 (H) 70 - 99 mg/dL  Glucose, capillary     Status: Abnormal   Collection Time: 12/05/17  9:36 PM  Result Value Ref Range   Glucose-Capillary 114 (H) 70 - 99 mg/dL  Glucose, capillary     Status: Abnormal   Collection Time: 12/06/17  3:33 AM  Result Value Ref Range   Glucose-Capillary 143 (H) 70 - 99 mg/dL  Basic metabolic panel     Status: Abnormal   Collection Time: 12/06/17  3:39 AM  Result Value Ref Range   Sodium 136 135 - 145 mmol/L    Comment: DELTA CHECK NOTED   Potassium 4.1 3.5 - 5.1 mmol/L   Chloride 106 98 - 111 mmol/L   CO2 23 22 - 32 mmol/L   Glucose, Bld 150 (H) 70 - 99 mg/dL   BUN 26 (H) 8 - 23 mg/dL   Creatinine, Ser 1.33 (H) 0.44 - 1.00 mg/dL   Calcium 8.4 (L) 8.9 - 10.3 mg/dL   GFR calc non Af Amer 32 (L) >60 mL/min   GFR calc Af  Amer 37 (L) >60 mL/min    Comment: (NOTE) The eGFR has been calculated using the CKD EPI equation. This calculation has not been validated in all clinical situations. eGFR's persistently <60 mL/min signify possible Chronic Kidney Disease.    Anion gap 7 5 - 15    Comment: Performed at [facility], [address] [place], [place] [address]  Magnesium     Status: None   Collection Time: 12/06/17  3:39 AM  Result Value Ref Range   Magnesium 2.4 1.7 - 2.4 mg/dL    Comment: Performed at [facility], [address] [place], [place] [address]  CBC WITH DIFFERENTIAL     Status: Abnormal   Collection Time: 12/06/17  3:39 AM  Result Value Ref Range   WBC 6.0 4.0 - 10.5 K/uL   RBC 3.62 (L) 3.87 - 5.11 MIL/uL   Hemoglobin 10.1 (L) 12.0 - 15.0 g/dL   HCT 31.7 (L) 36.0 - 46.0 %   MCV 87.6 80.0 - 100.0 fL   MCH 27.9 26.0 - 34.0 pg   MCHC 31.9 30.0 - 36.0 g/dL   RDW 16.4 (H) 11.5 - 15.5 %   Platelets 210 150 - 400 K/uL   nRBC 0.0 0.0 - 0.2 %   Neutrophils Relative % 61 %   Neutro Abs 3.7 1.7 - 7.7 K/uL   Lymphocytes Relative 27 %   Lymphs Abs 1.6 0.7 - 4.0 K/uL   Monocytes Relative 9 %   Monocytes Absolute 0.5 0.1 - 1.0 K/uL   Eosinophils Relative 2 %   Eosinophils Absolute 0.1 0.0 - 0.5 K/uL   Basophils Relative 1 %   Basophils Absolute 0.0 0.0 - 0.1 K/uL   Immature Granulocytes 0 %   Abs Immature Granulocytes 0.02 0.00 - 0.07 K/uL    Comment: Performed at [facility], [address] [place], [place] [address]  Glucose, capillary     Status: Abnormal   Collection Time: 12/06/17  7:41 AM  Result Value Ref Range   Glucose-Capillary 160 (H) 70 - 99 mg/dL  Glucose, capillary     Status: Abnormal   Collection Time: 12/06/17 11:22 AM  Result Value Ref Range   Glucose-Capillary 351 (H) 70 - 99 mg/dL  Glucose, capillary     Status: Abnormal   Collection Time: 12/06/17  4:29 PM  Result Value Ref Range   Glucose-Capillary 255 (H) 70 - 99 mg/dL  Glucose, capillary     Status: Abnormal    Collection Time: 12/06/17  9:16 PM  Result Value Ref Range   Glucose-Capillary 289 (H) 70 - 99 mg/dL   Comment 1 Notify RN    Comment 2 Document in Chart   Glucose, capillary     Status: Abnormal   Collection Time: 12/07/17  3:04 AM  Result Value Ref Range   Glucose-Capillary 203 (H) 70 - 99 mg/dL   Comment 1 Notify RN    Comment 2 Document in Chart   Basic metabolic panel     Status: Abnormal   Collection Time: 12/07/17  4:02 AM  Result Value Ref Range   Sodium 133 (L) 135 - 145 mmol/L   Potassium 4.1 3.5 - 5.1 mmol/L   Chloride 104 98 - 111 mmol/L   CO2 23 22 - 32 mmol/L   Glucose, Bld 212 (H) 70 - 99 mg/dL   BUN 17 8 - 23 mg/dL   Creatinine, Ser 1.13 (H) 0.44 - 1.00 mg/dL   Calcium 9.1 8.9 - 10.3 mg/dL   GFR calc non Af Amer 39 (L) >60 mL/min   GFR calc Af Amer 45 (L) >60 mL/min    Comment: (NOTE) The eGFR has been calculated using the CKD EPI equation. This calculation has not been validated in all clinical situations. eGFR's persistently <60 mL/min signify possible Chronic Kidney Disease.    Anion gap 6 5 - 15    Comment: Performed at [facility], [address], [place], [place] [address]  Magnesium     Status: None   Collection Time: 12/07/17  4:02 AM  Result Value Ref Range   Magnesium 2.0 1.7 - 2.4 mg/dL    Comment: Performed at [facility], [address], [place], [place] [address]  CBC WITH DIFFERENTIAL     Status: Abnormal   Collection Time: 12/07/17  4:02 AM  Result Value Ref Range   WBC 4.9 4.0 - 10.5 K/uL   RBC 3.82 (L) 3.87 - 5.11 MIL/uL   Hemoglobin 10.2 (L) 12.0 - 15.0 g/dL   HCT 32.9 (L) 36.0 - 46.0 %   MCV 86.1 80.0 - 100.0 fL   MCH 26.7 26.0 - 34.0 pg   MCHC 31.0 30.0 - 36.0 g/dL   RDW 16.3 (H) 11.5 - 15.5 %   Platelets 211 150 - 400 K/uL   nRBC 0.0 0.0 - 0.2 %   Neutrophils Relative % 58 %   Neutro Abs 2.8 1.7 - 7.7 K/uL   Lymphocytes Relative 30 %   Lymphs Abs 1.5 0.7 - 4.0 K/uL   Monocytes Relative 9 %   Monocytes Absolute 0.5 0.1 - 1.0  K/uL   Eosinophils Relative 2 %   Eosinophils Absolute 0.1 0.0 - 0.5 K/uL   Basophils Relative 0 %   Basophils Absolute 0.0 0.0 - 0.1 K/uL   Immature Granulocytes 1 %   Abs Immature Granulocytes 0.03 0.00 - 0.07 K/uL    Comment: Performed at [facility], [address], [place], [place] [address]  Phosphorus     Status: None   Collection Time: 12/07/17  4:02 AM  Result Value Ref Range   Phosphorus 3.1 2.5 - 4.6 mg/dL    Comment: Performed at [facility], [address], [place], [place] [address]  Glucose, capillary     Status: Abnormal   Collection Time: 12/07/17  7:21 AM  Result Value Ref Range   Glucose-Capillary 190 (H) 70 - 99 mg/dL    ABGS Recent Labs    12/05/17 0917  HCO3 24.1   CULTURES Recent Results (from the past 240 hour(s))  Urine culture     Status: Abnormal   Collection Time: 12/05/17  9:16 AM  Result Value Ref Range Status   Specimen Description   Final    URINE, CATHETERIZED Performed at [facility], [address], [place], [place] [address]    Special Requests   Final    NONE Performed at [facility], [address], [place], [place] [address]    Culture MULTIPLE SPECIES PRESENT, SUGGEST RECOLLECTION (A)  Final   Report Status 12/06/2017 FINAL  Final  Culture, blood (routine x 2)     Status: None (Preliminary result)   Collection Time: 12/05/17  9:22 AM  Result Value Ref Range Status   Specimen Description BLOOD RIGHT ANTECUBITAL  Final   Special Requests   Final    BOTTLES DRAWN AEROBIC AND ANAEROBIC Blood Culture adequate volume   Culture   Final    NO GROWTH 2 DAYS Performed at [facility], [address] [place], [place] [address]    Report Status PENDING  Incomplete  Culture, blood (routine x 2)     Status: None (Preliminary result)   Collection Time: 12/05/17 10:14 AM  Result Value Ref Range Status   Specimen Description BLOOD BLOOD RIGHT FOREARM  Final   Special Requests   Final    BOTTLES DRAWN AEROBIC ONLY Blood Culture results may  not be optimal due to an inadequate volume of blood received in culture bottles   Culture   Final    NO GROWTH 2 DAYS Performed at [facility], [address], [place], [place] [address]    Report Status PENDING  Incomplete  MRSA PCR Screening     Status: None   Collection Time: 12/05/17  5:00 PM  Result Value Ref Range Status   MRSA by PCR NEGATIVE NEGATIVE Final    Comment:        The GeneXpert MRSA Assay (FDA approved for NASAL specimens only), is one component of a comprehensive MRSA colonization surveillance program. It is not intended to diagnose MRSA infection nor to guide or monitor treatment for MRSA infections. Performed at [facility], [address], [place], [place] [address]    Studies/Results: Ct Abdomen Pelvis Wo Contrast  Result Date: 12/05/2017 CLINICAL DATA:  Weakness, abdominal pain and malaise for the past 3 days. EXAM: CT ABDOMEN AND PELVIS WITHOUT CONTRAST TECHNIQUE: Multidetector CT imaging of the abdomen and pelvis was performed following the standard protocol without IV contrast. COMPARISON:  CXR 12/05/2017 FINDINGS: Lower chest: Top-normal size heart without pericardial effusion or thickening. Left main and three-vessel coronary arteriosclerosis is noted. There is thoracic aortic atherosclerosis. There are mitral valvular and annular calcifications. Pulmonary consolidation consistent with atelectasis and/or pneumonia in the posterior right lower lobe. Hepatobiliary: Cholecystectomy. The unenhanced liver is unremarkable. Pancreas: Atrophic without inflammation or mass. Spleen: Normal Adrenals/Urinary Tract: Normal bilateral adrenal glands. Simple bilateral renal cysts arising off both kidneys with homogeneous hyperdense 6 mm lesion off the interpolar right kidney likely to represent a small proteinaceous or hemorrhagic cyst.  No worrisome features are noted. No nephrolithiasis nor hydroureteronephrosis. The urinary bladder is unremarkable for the degree of  distention. Stomach/Bowel: Small hiatal hernia. Decompressed stomach. The duodenal sweep and ligament of Treitz are unremarkable. Scattered colonic diverticulosis without acute diverticulitis. Large amount of retained stool throughout the colon consistent with constipation. Vascular/Lymphatic: Moderate aortoiliac and branch vessel atherosclerosis. No aortic aneurysm. No adenopathy. Reproductive: Status post hysterectomy. No adnexal masses. Other: No free air nor free fluid. Musculoskeletal: Multilevel degenerative facet arthropathy of the lumbar spine. No acute osseous abnormality. IMPRESSION: 1. Pulmonary consolidation in the right lower lobe. Atelectasis versus pneumonia or combination thereof. 2. Increased fecal retention throughout the colon consistent with constipation with scattered colonic diverticulosis. No diverticulitis. No abscess. 3. Simple and complex renal cysts. 4. Coronary arteriosclerosis and aortic atherosclerosis. Electronically Signed   By: Ashley Royalty M.D.   On: 12/05/2017 16:32   Dg Chest 2 View  Result Date: 12/05/2017 CLINICAL DATA:  Altered mental status EXAM: CHEST - 2 VIEW COMPARISON:  09/30/2017 FINDINGS: Bibasilar atelectasis. Mild cardiomegaly. No visible effusions or overt edema. No acute bony abnormality. IMPRESSION: Cardiomegaly.  Bibasilar atelectasis. Electronically Signed   By: Rolm Baptise M.D.   On: 12/05/2017 10:06   Ct Head Wo Contrast  Result Date: 12/05/2017 CLINICAL DATA:  Altered level of consciousness. EXAM: CT HEAD WITHOUT CONTRAST TECHNIQUE: Contiguous axial images were obtained from the base of the skull through the vertex without intravenous contrast. COMPARISON:  08/24/2017 FINDINGS: Brain: There is atrophy and chronic small vessel disease changes. No acute intracranial abnormality. Specifically, no hemorrhage, hydrocephalus, mass lesion, acute infarction, or significant intracranial injury. Vascular: No hyperdense vessel or unexpected calcification. Skull:  No acute calvarial abnormality. Sinuses/Orbits: Visualized paranasal sinuses and mastoids clear. Orbital soft tissues unremarkable. Other: None IMPRESSION: No acute intracranial abnormality. Atrophy, chronic microvascular disease. Electronically Signed   By: Rolm Baptise M.D.   On: 12/05/2017 09:55   Dg Chest Port 1 View  Result Date: 12/06/2017 CLINICAL DATA:  Hypothermia. EXAM: PORTABLE CHEST 1 VIEW COMPARISON:  Radiographs of December 05, 2017. FINDINGS: Stable cardiomegaly with central pulmonary vascular congestion. No pneumothorax is noted. Increased bibasilar atelectasis or edema is noted with associated pleural effusions. Bony thorax is unremarkable. IMPRESSION: Stable cardiomegaly with central pulmonary vascular congestion. Increased bibasilar atelectasis or edema is noted with associated pleural effusions. Electronically Signed   By: Marijo Conception, M.D.   On: 12/06/2017 08:43    Medications:  Prior to Admission:  Medications Prior to Admission  Medication Sig Dispense Refill Last Dose  . apixaban (ELIQUIS) 2.5 MG TABS tablet Take 1 tablet (2.5 mg total) by mouth 2 (two) times daily. 60 tablet 5 12/04/2017 at Unknown time  . carboxymethylcellulose (REFRESH PLUS) 0.5 % SOLN Place 1 drop into both eyes 4 (four) times daily.   12/05/2017 at Unknown time  . cephALEXin (KEFLEX) 500 MG capsule Take 500 mg by mouth 3 (three) times daily.   12/05/2017 at Unknown time  . dextromethorphan-guaiFENesin (ROBITUSSIN-DM) 10-100 MG/5ML liquid Take 5 mLs by mouth every 4 (four) hours as needed for cough.   unknown  . ferrous sulfate 325 (65 FE) MG tablet Take 1 tablet (325 mg total) by mouth daily. 30 tablet 3 12/04/2017 at Unknown time  . furosemide (LASIX) 20 MG tablet Take 20 mg by mouth. Give  3 tablets by mouth one time a day for  CHF   12/04/2017 at Unknown time  . furosemide (LASIX) 40 MG tablet TAKE 1 TABLET BY MOUTH TWICE  DAILY. (Patient taking differently: Take 40 mg by mouth 2 (two) times  daily. Give one tablet by mouth at bedtime for CHF) 180 tablet 0 12/04/2017 at Unknown time  . levothyroxine (SYNTHROID, LEVOTHROID) 88 MCG tablet Take 88 mcg by mouth daily before breakfast.   12/05/2017 at Unknown time  . LINZESS 290 MCG CAPS capsule Take 290 mcg by mouth daily before breakfast.    12/05/2017 at Unknown time  . neomycin-polymyxin-dexameth (MAXITROL) 0.1 % OINT Place 1 application into both eyes 3 (three) times daily. For dry eyes   12/05/2017 at Unknown time  . NIFEdipine (PROCARDIA-XL/ADALAT CC) 30 MG 24 hr tablet Take 30 mg by mouth daily.   12/04/2017 at Unknown time  . omeprazole (PRILOSEC) 20 MG capsule Take 20 mg by mouth 2 (two) times daily before a meal.    12/04/2017 at Unknown time  . oxybutynin (DITROPAN XL) 15 MG 24 hr tablet Take 15 mg by mouth daily.   12/04/2017 at Unknown time  . polyethylene glycol powder (GLYCOLAX/MIRALAX) powder Take 8.6 g by mouth daily.    12/05/2017 at Unknown time  . pravastatin (PRAVACHOL) 40 MG tablet Take 40 mg by mouth at bedtime.    12/04/2017 at Unknown time  . risperiDONE (RISPERDAL) 0.25 MG tablet Take 0.25 mg by mouth daily.   12/04/2017 at Unknown time  . risperiDONE (RISPERDAL) 0.5 MG tablet Take 0.5 mg by mouth at bedtime.   12/04/2017 at Unknown time  . rOPINIRole (REQUIP) 0.5 MG tablet Take 0.5 mg by mouth at bedtime.   12/04/2017 at Unknown time  . traZODone (DESYREL) 50 MG tablet Take 25 mg by mouth at bedtime.   12/04/2017 at Unknown time  . umeclidinium-vilanterol (ANORO ELLIPTA) 62.5-25 MCG/INH AEPB Inhale 1 puff into the lungs daily.   12/04/2017 at Unknown time  . acetaminophen (TYLENOL) 325 MG tablet Take 650 mg by mouth every 6 (six) hours as needed for mild pain or moderate pain.    unknown  . albuterol (PROVENTIL HFA;VENTOLIN HFA) 108 (90 Base) MCG/ACT inhaler Inhale 2 puffs into the lungs every 6 (six) hours as needed for wheezing or shortness of breath. 1 Inhaler 2 unknown  . gabapentin (NEURONTIN) 600 MG tablet  Take 600 mg by mouth at bedtime.    Not Taking at Unknown time  . ketoconazole (NIZORAL) 2 % shampoo Apply 1 application topically See admin instructions. Once weekly As needed for antifungal.   unknown  . ketorolac (TORADOL) 10 MG tablet Take 10 mg by mouth every 6 (six) hours as needed for moderate pain (to hand).    unknown  . silver sulfADIAZINE (SILVADENE) 1 % cream Apply 1 application topically daily. Left toe   unknown  . trolamine salicylate (ASPERCREME) 10 % cream Apply 1 application topically every 12 (twelve) hours as needed for muscle pain.    unknown   Scheduled: . apixaban  2.5 mg Oral BID  . ferrous sulfate  325 mg Oral Daily  . gabapentin  600 mg Oral QHS  . insulin aspart  0-5 Units Subcutaneous QHS  . insulin aspart  0-9 Units Subcutaneous TID WC  . insulin glargine  14 Units Subcutaneous Daily  . levothyroxine  88 mcg Oral QAC breakfast  . linaclotide  290 mcg Oral QAC breakfast  . NIFEdipine  30 mg Oral Daily  . oxybutynin  15 mg Oral Daily  . polyethylene glycol  17 g Oral Daily  . pravastatin  40 mg Oral q1800  . risperiDONE  0.25 mg  Oral Daily  . risperiDONE  0.5 mg Oral QHS  . rOPINIRole  0.5 mg Oral QHS  . traZODone  25 mg Oral QHS  . umeclidinium-vilanterol  1 puff Inhalation Daily   Continuous: . 0.9 % NaCl with KCl 40 mEq / L 35 mL/hr at 12/07/17 0621  . famotidine (PEPCID) IV 20 mg (12/06/17 1335)  . piperacillin-tazobactam (ZOSYN)  IV 12.5 mL/hr at 12/07/17 0621  . vancomycin 750 mg (12/06/17 1233)   KBO:QUCLTVTWYSORT **OR** acetaminophen, iopamidol, ondansetron **OR** ondansetron (ZOFRAN) IV, polyethylene glycol, polyvinyl alcohol  Assesment: She was admitted with nonketotic hyperosmolar coma related to diabetes.  She has new onset diabetes.  She has been on an atypical antipsychotic and her diabetes may be related to that or to simply her advanced age.  She is had a urinary tract infection at her assisted living facility and that is been treated.   She is on antibiotics now for pneumonia  She has acute right lower lobe pneumonia and that is being treated and seems better  She has chronic diastolic heart failure.  She is dehydrated and we are trying to rehydrate but have to be careful because of her heart failure  She has chronic kidney disease which looks about the same  She has had severe anxiety with some elements of psychosis at baseline Principal Problem:   Non-ketotic hyperosmolar coma ([place]) Active Problems:   Dehydration   Failure to thrive in adult   Hypothyroidism   Acute lower UTI   AKI (acute kidney injury) ([place])   Chronic diastolic CHF (congestive heart failure) (HCC)   Chronic kidney disease (CKD), stage III (moderate) (HCC)   GERD (gastroesophageal reflux disease)   Hypothermia   DNR (do not resuscitate)    Plan: Continue current treatments.  I think she can transfer out of the stepdown unit now.  I do not think she is currently critically ill.    LOS: 2 days   Kierra Jezewski L 12/07/2017, 7:32 AM

## 2017-12-07 NOTE — Care Management Important Message (Signed)
Important Message  Patient Details  Name: Christine [facility] MRN: [id number] Date of Birth: January 17, 1918   Medicare Important Message Given:  Yes    Shelda Altes 12/07/2017, 2:35 PM

## 2017-12-07 NOTE — Progress Notes (Addendum)
Inpatient Diabetes Program Recommendations  AACE/ADA: New Consensus Statement on Inpatient Glycemic Control (2015)  Target Ranges:  Prepandial:   less than 140 mg/dL      Peak postprandial:   less than 180 mg/dL (1-2 hours)      Critically ill patients:  140 - 180 mg/dL   Results for Christine Hancock, Christine Hancock (MRN [id number]) as of 12/07/2017 08:51  Ref. Range 12/06/2017 07:41 12/06/2017 11:22 12/06/2017 16:29 12/06/2017 21:16  Glucose-Capillary Latest Ref Range: 70 - 99 mg/dL 160 (H)  2 units NOVOLOG  351 (H)  9 units NOVOLOG +  14 units LANTUS  255 (H)  5 units NOVOLOG  289 (H)  3 units NOVOLOG    Results for [facility], [facility] (MRN [id number]) as of 12/07/2017 08:51  Ref. Range 12/07/2017 03:04 12/07/2017 07:21  Glucose-Capillary Latest Ref Range: 70 - 99 mg/dL 203 (H) 190 (H)  2 units NOVOLOG     Admit with Glucose 621 mg/dl/ HHNK/ New Diagnosis of DM  PCP: Dr. Luan Pulling in [place].   Resides at [facility]  Current Orders: Lantus 14 units Daily                            Novolog Sensitive Correction Scale/ SSI (0-9 units) TID AC + HS      MD- Please consider the following in-hospital insulin adjustments:  1. Increase Lantus to 16 units Daily  2. Start Novolog Meal Coverage: Novolog 4 units TID with meals  (Please add the following Hold Parameters: Hold if pt eats <50% of meal, Hold if pt NPO)    Addendum 2:30pm- Called [name] by the phone this afternoon.  RN in room to help pt answer phone.  DM Coordinator not physically present on campus today (calling from [facility] in [place]).  [name] gave me permission to call her niece [name] to discuss her care and new diagnosis of DM.  Called [name] but could not reach her.  Left voicemail on [name]'s phone asking her to call me back if she has any questions.        --Will follow patient during hospitalization--  Wyn Quaker RN, MSN, CDE Diabetes Coordinator Inpatient Glycemic  Control Team Team Pager: [phone] (8a-5p)

## 2017-12-07 NOTE — Progress Notes (Signed)
EKG done and given to nurse 

## 2017-12-07 NOTE — Clinical Social Work Note (Signed)
Clinical Social Work Assessment  Patient Details  Name: Christine Hancock MRN: [id number] Date of Birth: 1917-12-16  Date of referral:  12/07/17               Reason for consult:  Discharge Planning                Permission sought to share information with:    Permission granted to share information::     Name::        Agency::     Relationship::     Contact Information:     Housing/Transportation Living arrangements for the past 2 months:  [facility] of Information:  Facility Patient Interpreter Needed:  None Criminal Activity/Legal Involvement Pertinent to Current Situation/Hospitalization:  No - Comment as needed Significant Relationships:  Other Family Members Lives with:  Facility Resident Do you feel safe going back to the place where you live?  Yes Need for family participation in patient care:  No (Coment)  Care giving concerns: Pt resides at [facility] in [place].   Social Worker assessment / plan: Pt is a 84 year old female admitted from [facility] [place] [facility]. Spoke with RN at [facility] today to update. Per RN, pt requires extensive care at the facility. She needs assistance with all ADL's other than feeding. Pt uses a wheelchair for mobility. Pt uses O2 continuously at the facility. Pt's niece, Christine Hancock, is her contact person. Plan is for return to [facility] at dc. She will need EMS transport. Will follow and assist with dc planning.  Employment status:  Retired Forensic scientist:  Medicare PT Recommendations:  Not assessed at this time Information / Referral to community resources:     Patient/Family's Response to care: Pt and family appear to be accepting of care.  Patient/Family's Understanding of and Emotional Response to Diagnosis, Current Treatment, and Prognosis: Pt has been confused during this admission. Family members have been updated on pt's diagnosis and treatment recommendations and appear to understand. No emotional distress  identified.  Emotional Assessment Appearance:  Appears stated age Attitude/Demeanor/Rapport:  Unable to Assess Affect (typically observed):  Unable to Assess Orientation:  Oriented to Self Alcohol / Substance use:  Not Applicable Psych involvement (Current and /or in the community):  No (Comment)  Discharge Needs  Concerns to be addressed:  Discharge Planning Concerns Readmission within the last 30 days:  No Current discharge risk:  None Barriers to Discharge:  No Barriers Identified   Shade Flood, LCSW 12/07/2017, 11:08 AM

## 2017-12-08 LAB — CBC WITH DIFFERENTIAL/PLATELET
Abs Immature Granulocytes: 0.04 10*3/uL (ref 0.00–0.07)
Basophils Absolute: 0 10*3/uL (ref 0.0–0.1)
Basophils Relative: 0 %
EOS ABS: 0.1 10*3/uL (ref 0.0–0.5)
EOS PCT: 2 %
HEMATOCRIT: 34.2 % — AB (ref 36.0–46.0)
Hemoglobin: 10.7 g/dL — ABNORMAL LOW (ref 12.0–15.0)
IMMATURE GRANULOCYTES: 1 %
LYMPHS ABS: 1.3 10*3/uL (ref 0.7–4.0)
Lymphocytes Relative: 22 %
MCH: 27.2 pg (ref 26.0–34.0)
MCHC: 31.3 g/dL (ref 30.0–36.0)
MCV: 87 fL (ref 80.0–100.0)
MONOS PCT: 9 %
Monocytes Absolute: 0.5 10*3/uL (ref 0.1–1.0)
Neutro Abs: 3.9 10*3/uL (ref 1.7–7.7)
Neutrophils Relative %: 66 %
Platelets: 216 10*3/uL (ref 150–400)
RBC: 3.93 MIL/uL (ref 3.87–5.11)
RDW: 16.4 % — AB (ref 11.5–15.5)
WBC: 5.9 10*3/uL (ref 4.0–10.5)
nRBC: 0 % (ref 0.0–0.2)

## 2017-12-08 LAB — GLUCOSE, CAPILLARY
Glucose-Capillary: 243 mg/dL — ABNORMAL HIGH (ref 70–99)
Glucose-Capillary: 241 mg/dL — ABNORMAL HIGH (ref 70–99)
Glucose-Capillary: 301 mg/dL — ABNORMAL HIGH (ref 70–99)
Glucose-Capillary: 297 mg/dL — ABNORMAL HIGH (ref 70–99)
Glucose-Capillary: 154 mg/dL — ABNORMAL HIGH (ref 70–99)

## 2017-12-08 LAB — BASIC METABOLIC PANEL
Anion gap: 6 (ref 5–15)
BUN: 18 mg/dL (ref 8–23)
CALCIUM: 9.3 mg/dL (ref 8.9–10.3)
CO2: 22 mmol/L (ref 22–32)
CREATININE: 1.2 mg/dL — AB (ref 0.44–1.00)
Chloride: 105 mmol/L (ref 98–111)
GFR calc Af Amer: 42 mL/min — ABNORMAL LOW (ref >60)
GFR calc non Af Amer: 36 mL/min — ABNORMAL LOW (ref >60)
GLUCOSE: 269 mg/dL — AB (ref 70–99)
Potassium: 4.3 mmol/L (ref 3.5–5.1)
Sodium: 133 mmol/L — ABNORMAL LOW (ref 135–145)

## 2017-12-08 MED ORDER — INSULIN GLARGINE 100 UNIT/ML ~~LOC~~ SOLN
18.0000 [IU] | Freq: Every day | SUBCUTANEOUS | Status: DC
  Administered 2017-12-08 – 2017-12-13 (×6): 18 [IU] via SUBCUTANEOUS
  Filled 2017-12-08 (×8): qty 0.18

## 2017-12-08 MED ORDER — INSULIN ASPART 100 UNIT/ML ~~LOC~~ SOLN
0.0000 [IU] | Freq: Every day | SUBCUTANEOUS | Status: DC
  Administered 2017-12-10 – 2017-12-12 (×2): 2 [IU] via SUBCUTANEOUS

## 2017-12-08 MED ORDER — SODIUM CHLORIDE 0.9 % IV SOLN
INTRAVENOUS | Status: DC | PRN
  Administered 2017-12-08: 250 mL via INTRAVENOUS

## 2017-12-08 MED ORDER — INSULIN ASPART 100 UNIT/ML ~~LOC~~ SOLN
0.0000 [IU] | Freq: Three times a day (TID) | SUBCUTANEOUS | Status: DC
  Administered 2017-12-08: 11 [IU] via SUBCUTANEOUS
  Administered 2017-12-08: 8 [IU] via SUBCUTANEOUS
  Administered 2017-12-09: 2 [IU] via SUBCUTANEOUS
  Administered 2017-12-09: 11 [IU] via SUBCUTANEOUS
  Administered 2017-12-09: 5 [IU] via SUBCUTANEOUS
  Administered 2017-12-10: 2 [IU] via SUBCUTANEOUS
  Administered 2017-12-10: 3 [IU] via SUBCUTANEOUS
  Administered 2017-12-10: 15 [IU] via SUBCUTANEOUS
  Administered 2017-12-11: 5 [IU] via SUBCUTANEOUS
  Administered 2017-12-11: 3 [IU] via SUBCUTANEOUS
  Administered 2017-12-11 – 2017-12-12 (×2): 2 [IU] via SUBCUTANEOUS
  Administered 2017-12-12: 3 [IU] via SUBCUTANEOUS
  Administered 2017-12-12: 8 [IU] via SUBCUTANEOUS
  Administered 2017-12-13: 5 [IU] via SUBCUTANEOUS

## 2017-12-08 NOTE — Progress Notes (Signed)
Subjective: She looks better.  She is still somewhat confused.  She is coughing less.  Her blood sugar is still not controlled.  No other new complaints  Objective: Vital signs in last 24 hours: Temp:  [97.5 F (36.4 C)-97.8 F (36.6 C)] 97.7 F (36.5 C) (10/17 0623) Pulse Rate:  [81-93] 90 (10/17 0623) Resp:  [16-38] 16 (10/17 0623) BP: (114-142)/(65-74) 142/74 (10/17 0623) SpO2:  [95 %-100 %] 98 % (10/17 0816) Weight change:  Last BM Date: 12/07/17  Intake/Output from previous day: 10/16 0701 - 10/17 0700 In: 1289.4 [P.O.:360; I.V.:738.6; IV Piggyback:190.8] Out: 2701 [Urine:2700; Stool:1]  PHYSICAL EXAM General appearance: alert and Confused Resp: Rales and rhonchi on the right Cardio: regular rate and rhythm, S1, S2 normal, no murmur, click, rub or gallop GI: soft, non-tender; bowel sounds normal; no masses,  no organomegaly Extremities: Chronic edema of both legs  Lab Results:  Results for orders placed or performed during the hospital encounter of 12/05/17 (from the past 48 hour(s))  Glucose, capillary     Status: Abnormal   Collection Time: 12/06/17 11:22 AM  Result Value Ref Range   Glucose-Capillary 351 (H) 70 - 99 mg/dL  Glucose, capillary     Status: Abnormal   Collection Time: 12/06/17  4:29 PM  Result Value Ref Range   Glucose-Capillary 255 (H) 70 - 99 mg/dL  Glucose, capillary     Status: Abnormal   Collection Time: 12/06/17  9:16 PM  Result Value Ref Range   Glucose-Capillary 289 (H) 70 - 99 mg/dL   Comment 1 Notify RN    Comment 2 Document in Chart   Glucose, capillary     Status: Abnormal   Collection Time: 12/07/17  3:04 AM  Result Value Ref Range   Glucose-Capillary 203 (H) 70 - 99 mg/dL   Comment 1 Notify RN    Comment 2 Document in Chart   Basic metabolic panel     Status: Abnormal   Collection Time: 12/07/17  4:02 AM  Result Value Ref Range   Sodium 133 (L) 135 - 145 mmol/L   Potassium 4.1 3.5 - 5.1 mmol/L   Chloride 104 98 - 111 mmol/L    CO2 23 22 - 32 mmol/L   Glucose, Bld 212 (H) 70 - 99 mg/dL   BUN 17 8 - 23 mg/dL   Creatinine, Ser 1.13 (H) 0.44 - 1.00 mg/dL   Calcium 9.1 8.9 - 10.3 mg/dL   GFR calc non Af Amer 39 (L) >60 mL/min   GFR calc Af Amer 45 (L) >60 mL/min    Comment: (NOTE) The eGFR has been calculated using the CKD EPI equation. This calculation has not been validated in all clinical situations. eGFR's persistently <60 mL/min signify possible Chronic Kidney Disease.    Anion gap 6 5 - 15    Comment: Performed at [facility], [address] [place], [place] [address]  Magnesium     Status: None   Collection Time: 12/07/17  4:02 AM  Result Value Ref Range   Magnesium 2.0 1.7 - 2.4 mg/dL    Comment: Performed at [facility], [address], [place], [place] [address]  CBC WITH DIFFERENTIAL     Status: Abnormal   Collection Time: 12/07/17  4:02 AM  Result Value Ref Range   WBC 4.9 4.0 - 10.5 K/uL   RBC 3.82 (L) 3.87 - 5.11 MIL/uL   Hemoglobin 10.2 (L) 12.0 - 15.0 g/dL   HCT 32.9 (L) 36.0 - 46.0 %  MCV 86.1 80.0 - 100.0 fL   MCH 26.7 26.0 - 34.0 pg   MCHC 31.0 30.0 - 36.0 g/dL   RDW 16.3 (H) 11.5 - 15.5 %   Platelets 211 150 - 400 K/uL   nRBC 0.0 0.0 - 0.2 %   Neutrophils Relative % 58 %   Neutro Abs 2.8 1.7 - 7.7 K/uL   Lymphocytes Relative 30 %   Lymphs Abs 1.5 0.7 - 4.0 K/uL   Monocytes Relative 9 %   Monocytes Absolute 0.5 0.1 - 1.0 K/uL   Eosinophils Relative 2 %   Eosinophils Absolute 0.1 0.0 - 0.5 K/uL   Basophils Relative 0 %   Basophils Absolute 0.0 0.0 - 0.1 K/uL   Immature Granulocytes 1 %   Abs Immature Granulocytes 0.03 0.00 - 0.07 K/uL    Comment: Performed at [facility], [address] [place], [place] [address]  Phosphorus     Status: None   Collection Time: 12/07/17  4:02 AM  Result Value Ref Range   Phosphorus 3.1 2.5 - 4.6 mg/dL    Comment: Performed at [facility], [address] [place], [place] [address]  Glucose, capillary     Status: Abnormal   Collection Time:  12/07/17  7:21 AM  Result Value Ref Range   Glucose-Capillary 190 (H) 70 - 99 mg/dL  Glucose, capillary     Status: Abnormal   Collection Time: 12/07/17 11:24 AM  Result Value Ref Range   Glucose-Capillary 332 (H) 70 - 99 mg/dL   Comment 1 Notify RN    Comment 2 Document in Chart   Glucose, capillary     Status: Abnormal   Collection Time: 12/07/17  4:56 PM  Result Value Ref Range   Glucose-Capillary 277 (H) 70 - 99 mg/dL   Comment 1 Notify RN    Comment 2 Document in Chart   Glucose, capillary     Status: Abnormal   Collection Time: 12/07/17 10:22 PM  Result Value Ref Range   Glucose-Capillary 271 (H) 70 - 99 mg/dL   Comment 1 Notify RN    Comment 2 Document in Chart   Glucose, capillary     Status: Abnormal   Collection Time: 12/08/17  3:35 AM  Result Value Ref Range   Glucose-Capillary 243 (H) 70 - 99 mg/dL  Basic metabolic panel     Status: Abnormal   Collection Time: 12/08/17  4:53 AM  Result Value Ref Range   Sodium 133 (L) 135 - 145 mmol/L   Potassium 4.3 3.5 - 5.1 mmol/L   Chloride 105 98 - 111 mmol/L   CO2 22 22 - 32 mmol/L   Glucose, Bld 269 (H) 70 - 99 mg/dL   BUN 18 8 - 23 mg/dL   Creatinine, Ser 1.20 (H) 0.44 - 1.00 mg/dL   Calcium 9.3 8.9 - 10.3 mg/dL   GFR calc non Af Amer 36 (L) >60 mL/min   GFR calc Af Amer 42 (L) >60 mL/min    Comment: (NOTE) The eGFR has been calculated using the CKD EPI equation. This calculation has not been validated in all clinical situations. eGFR's persistently <60 mL/min signify possible Chronic Kidney Disease.    Anion gap 6 5 - 15    Comment: Performed at [facility], [address], [place], [place] [address]  CBC WITH DIFFERENTIAL     Status: Abnormal   Collection Time: 12/08/17  4:53 AM  Result Value Ref Range   WBC 5.9 4.0 - 10.5 K/uL  RBC 3.93 3.87 - 5.11 MIL/uL   Hemoglobin 10.7 (L) 12.0 - 15.0 g/dL   HCT 34.2 (L) 36.0 - 46.0 %   MCV 87.0 80.0 - 100.0 fL   MCH 27.2 26.0 - 34.0 pg   MCHC 31.3 30.0 - 36.0 g/dL    RDW 16.4 (H) 11.5 - 15.5 %   Platelets 216 150 - 400 K/uL   nRBC 0.0 0.0 - 0.2 %   Neutrophils Relative % 66 %   Neutro Abs 3.9 1.7 - 7.7 K/uL   Lymphocytes Relative 22 %   Lymphs Abs 1.3 0.7 - 4.0 K/uL   Monocytes Relative 9 %   Monocytes Absolute 0.5 0.1 - 1.0 K/uL   Eosinophils Relative 2 %   Eosinophils Absolute 0.1 0.0 - 0.5 K/uL   Basophils Relative 0 %   Basophils Absolute 0.0 0.0 - 0.1 K/uL   Immature Granulocytes 1 %   Abs Immature Granulocytes 0.04 0.00 - 0.07 K/uL    Comment: Performed at [facility], [address] [place], [place] [address]  Glucose, capillary     Status: Abnormal   Collection Time: 12/08/17  7:33 AM  Result Value Ref Range   Glucose-Capillary 241 (H) 70 - 99 mg/dL    ABGS Recent Labs    12/05/17 0917  HCO3 24.1   CULTURES Recent Results (from the past 240 hour(s))  Urine culture     Status: Abnormal   Collection Time: 12/05/17  9:16 AM  Result Value Ref Range Status   Specimen Description   Final    URINE, CATHETERIZED Performed at [facility], [address], [place], [place] [address]    Special Requests   Final    NONE Performed at [facility], [address], [place], [place] [address]    Culture MULTIPLE SPECIES PRESENT, SUGGEST RECOLLECTION (A)  Final   Report Status 12/06/2017 FINAL  Final  Culture, blood (routine x 2)     Status: None (Preliminary result)   Collection Time: 12/05/17  9:22 AM  Result Value Ref Range Status   Specimen Description BLOOD RIGHT ANTECUBITAL  Final   Special Requests   Final    BOTTLES DRAWN AEROBIC AND ANAEROBIC Blood Culture adequate volume   Culture   Final    NO GROWTH 3 DAYS Performed at [facility], [address] [place], [place] [address]    Report Status PENDING  Incomplete  Culture, blood (routine x 2)     Status: None (Preliminary result)   Collection Time: 12/05/17 10:14 AM  Result Value Ref Range Status   Specimen Description BLOOD BLOOD RIGHT FOREARM  Final   Special Requests    Final    BOTTLES DRAWN AEROBIC ONLY Blood Culture results may not be optimal due to an inadequate volume of blood received in culture bottles   Culture   Final    NO GROWTH 3 DAYS Performed at [facility], [address] [place], [place] [address]    Report Status PENDING  Incomplete  MRSA PCR Screening     Status: None   Collection Time: 12/05/17  5:00 PM  Result Value Ref Range Status   MRSA by PCR NEGATIVE NEGATIVE Final    Comment:        The GeneXpert MRSA Assay (FDA approved for NASAL specimens only), is one component of a comprehensive MRSA colonization surveillance program. It is not intended to diagnose MRSA infection nor to guide or monitor treatment for MRSA infections. Performed at [facility],  [place], [place] [address]    Studies/Results: No results found.  Medications:  Prior to Admission:  Medications Prior to Admission  Medication Sig Dispense Refill Last Dose  . apixaban (ELIQUIS) 2.5 MG TABS tablet Take 1 tablet (2.5 mg total) by mouth 2 (two) times daily. 60 tablet 5 12/04/2017 at Unknown time  . carboxymethylcellulose (REFRESH PLUS) 0.5 % SOLN Place 1 drop into both eyes 4 (four) times daily.   12/05/2017 at Unknown time  . cephALEXin (KEFLEX) 500 MG capsule Take 500 mg by mouth 3 (three) times daily.   12/05/2017 at Unknown time  . dextromethorphan-guaiFENesin (ROBITUSSIN-DM) 10-100 MG/5ML liquid Take 5 mLs by mouth every 4 (four) hours as needed for cough.   unknown  . ferrous sulfate 325 (65 FE) MG tablet Take 1 tablet (325 mg total) by mouth daily. 30 tablet 3 12/04/2017 at Unknown time  . furosemide (LASIX) 20 MG tablet Take 20 mg by mouth. Give  3 tablets by mouth one time a day for  CHF   12/04/2017 at Unknown time  . furosemide (LASIX) 40 MG tablet TAKE 1 TABLET BY MOUTH TWICE DAILY. (Patient taking differently: Take 40 mg by mouth 2 (two) times daily. Give one tablet by mouth at bedtime for CHF) 180 tablet 0 12/04/2017 at Unknown  time  . levothyroxine (SYNTHROID, LEVOTHROID) 88 MCG tablet Take 88 mcg by mouth daily before breakfast.   12/05/2017 at Unknown time  . LINZESS 290 MCG CAPS capsule Take 290 mcg by mouth daily before breakfast.    12/05/2017 at Unknown time  . neomycin-polymyxin-dexameth (MAXITROL) 0.1 % OINT Place 1 application into both eyes 3 (three) times daily. For dry eyes   12/05/2017 at Unknown time  . NIFEdipine (PROCARDIA-XL/ADALAT CC) 30 MG 24 hr tablet Take 30 mg by mouth daily.   12/04/2017 at Unknown time  . omeprazole (PRILOSEC) 20 MG capsule Take 20 mg by mouth 2 (two) times daily before a meal.    12/04/2017 at Unknown time  . oxybutynin (DITROPAN XL) 15 MG 24 hr tablet Take 15 mg by mouth daily.   12/04/2017 at Unknown time  . polyethylene glycol powder (GLYCOLAX/MIRALAX) powder Take 8.6 g by mouth daily.    12/05/2017 at Unknown time  . pravastatin (PRAVACHOL) 40 MG tablet Take 40 mg by mouth at bedtime.    12/04/2017 at Unknown time  . risperiDONE (RISPERDAL) 0.25 MG tablet Take 0.25 mg by mouth daily.   12/04/2017 at Unknown time  . risperiDONE (RISPERDAL) 0.5 MG tablet Take 0.5 mg by mouth at bedtime.   12/04/2017 at Unknown time  . rOPINIRole (REQUIP) 0.5 MG tablet Take 0.5 mg by mouth at bedtime.   12/04/2017 at Unknown time  . traZODone (DESYREL) 50 MG tablet Take 25 mg by mouth at bedtime.   12/04/2017 at Unknown time  . umeclidinium-vilanterol (ANORO ELLIPTA) 62.5-25 MCG/INH AEPB Inhale 1 puff into the lungs daily.   12/04/2017 at Unknown time  . acetaminophen (TYLENOL) 325 MG tablet Take 650 mg by mouth every 6 (six) hours as needed for mild pain or moderate pain.    unknown  . albuterol (PROVENTIL HFA;VENTOLIN HFA) 108 (90 Base) MCG/ACT inhaler Inhale 2 puffs into the lungs every 6 (six) hours as needed for wheezing or shortness of breath. 1 Inhaler 2 unknown  . gabapentin (NEURONTIN) 600 MG tablet Take 600 mg by mouth at bedtime.    Not Taking at Unknown time  . ketoconazole (NIZORAL)  2 % shampoo Apply 1  application topically See admin instructions. Once weekly As needed for antifungal.   unknown  . ketorolac (TORADOL) 10 MG tablet Take 10 mg by mouth every 6 (six) hours as needed for moderate pain (to hand).    unknown  . silver sulfADIAZINE (SILVADENE) 1 % cream Apply 1 application topically daily. Left toe   unknown  . trolamine salicylate (ASPERCREME) 10 % cream Apply 1 application topically every 12 (twelve) hours as needed for muscle pain.    unknown   Scheduled: . apixaban  2.5 mg Oral BID  . ferrous sulfate  325 mg Oral Daily  . gabapentin  600 mg Oral QHS  . insulin aspart  0-15 Units Subcutaneous TID WC  . insulin aspart  0-5 Units Subcutaneous QHS  . insulin glargine  18 Units Subcutaneous Daily  . levothyroxine  88 mcg Oral QAC breakfast  . linaclotide  290 mcg Oral QAC breakfast  . NIFEdipine  30 mg Oral Daily  . oxybutynin  15 mg Oral Daily  . polyethylene glycol  17 g Oral Daily  . pravastatin  40 mg Oral q1800  . risperiDONE  0.25 mg Oral Daily  . risperiDONE  0.5 mg Oral QHS  . rOPINIRole  0.5 mg Oral QHS  . traZODone  25 mg Oral QHS  . umeclidinium-vilanterol  1 puff Inhalation Daily   Continuous: . famotidine (PEPCID) IV Stopped (12/07/17 1241)  . piperacillin-tazobactam (ZOSYN)  IV 3.375 g (12/08/17 0542)   HUD:[id number] **OR** acetaminophen, iopamidol, ondansetron **OR** ondansetron (ZOFRAN) IV, polyethylene glycol, polyvinyl alcohol  Assesment: She was admitted with nonketotic hyperosmolar coma with blood sugar greater than 600.  She is better but her blood sugar is still up.  She has pneumonia which may have precipitated this episode.  Alternatively she could have aspirated because of her altered mental status  At baseline she has severe anxiety with minimal psychotic disease that is been treated for many years with risperidone  She has heart failure which seems to be stable but I am going to DC her IV fluids at this  time   Principal Problem:   Non-ketotic hyperosmolar coma ([place]) Active Problems:   Dehydration   Failure to thrive in adult   Hypothyroidism   Acute lower UTI   AKI (acute kidney injury) ([place])   Chronic diastolic CHF (congestive heart failure) ([place])   Chronic kidney disease (CKD), stage III (moderate) (HCC)   GERD (gastroesophageal reflux disease)   Hypothermia   DNR (do not resuscitate)    Plan: Increase Lantus.  Change her to moderate sliding scale.  Ask for PT consultation.  Continue antibiotics.  DC IV fluids    LOS: 3 days   Argie Lober L 12/08/2017, 8:18 AM

## 2017-12-08 NOTE — Evaluation (Signed)
Physical Therapy Evaluation Patient Details Name: Christine Hancock MRN: [id number] DOB: [birth year] Today's Date: 12/08/2017   History of Present Illness  Christine Hancock is a 84 y.o. female presenting for evaluation of weakness.  Pt also having some abdominal pain.  For the past 3 days patient has not been feeling well.    Patient states she has been having difficulty with her words.  Pt can't remember when she had her last bowel movement.  Her symptoms have been present for the past 3 days.   She denies known fevers, cough.  Per facility paperwork, patient has been treated for the last 3 days for a UTI with Keflex 500 mg 3 times daily.  She is not on any prednisone or steroids.  Patient is on Eliquis, she denies fall, trauma, or injury.  Per EMS, patient CBG was read as high.  Patient does not have a history of diabetes.   Additional history obtained from chart review, patient with a history of recent GI bleed, CKD, hypotension, hypothyroidism, CHF.  Is on O2 at baseline, 2 L.  She has not needed to raise her O2 recently. Pt in wheelchair at baseline.  Per chart review, pt was DNR at last hospitalization.  Discussed with staff member at [facility] where patient is a resident.  Per staff member, patient has been confused and slightly altered for the past 3 days.  Today they noticed that her speech was more slurred.  They do not know when this began, as the person was talking to was not present over the weekend.  Patient has been afebrile and not hypothermic.  Staff member confirms that patient does not have a history of diabetes.  I also spoke with niece who confirms that patient does not have a history of diabetes mellitus.   She was noted in ED to have a blood sugar greater than 600.  She was also noted to be hypothermic and was started on broad spectrum antibiotics, warmed IV fluids and a bear hugger warming device.  She is being admitted to SDU for hyperosmolar nonketotic syndrome for IV insulin infusion.       Clinical Impression  Patient demonstrates slow labored movement for sitting up at bedside, limited to a few side steps to transfer to chair secondary to BLE weakness, poor standing balance, easily fatigued and became SOB after exertion with 3 LPM O2 on.  Patient tolerated sitting up in chair after therapy - RN notified.  Patient will benefit from continued physical therapy in hospital and recommended venue below to increase strength, balance, endurance for safe ADLs and gait.    Follow Up Recommendations SNF;Supervision/Assistance - 24 hour;Supervision for mobility/OOB    Equipment Recommendations  None recommended by PT    Recommendations for Other Services       Precautions / Restrictions Precautions Precautions: Fall Restrictions Weight Bearing Restrictions: No      Mobility  Bed Mobility Overal bed mobility: Needs Assistance Bed Mobility: Supine to Sit     Supine to sit: Mod assist     General bed mobility comments: slow labored movement  Transfers Overall transfer level: Needs assistance Equipment used: Rolling walker (2 wheeled) Transfers: Sit to/from [facility] Sit to Stand: Mod assist;Max assist Stand pivot transfers: Mod assist;Max assist       General transfer comment: very unsteady on feet, patient fearful of falling  Ambulation/Gait Ambulation/Gait assistance: Max assist Gait Distance (Feet): 3 Feet Assistive device: Rolling walker (2 wheeled) Gait Pattern/deviations: Decreased step  length - right;Decreased step length - left;Decreased stride length Gait velocity: slow   General Gait Details: limited to 6-7 short unsteady labored side steps to transfer ot chair while on 3 LPM O2, unsafe to ambulate away from bedside  Stairs            Wheelchair Mobility    Modified Rankin (Stroke Patients Only)       Balance Overall balance assessment: Needs assistance Sitting-balance support: Feet supported;No upper extremity  supported Sitting balance-Leahy Scale: Good     Standing balance support: During functional activity;Bilateral upper extremity supported Standing balance-Leahy Scale: Poor Standing balance comment: fair/poor with RW                             Pertinent Vitals/Pain Pain Assessment: No/denies pain    Home Living Family/patient expects to be discharged to:: Assisted living               Home Equipment: Wheelchair - [employer] - 2 wheels Additional Comments: information per patient    Prior Function Level of Independence: Needs assistance   Gait / Transfers Assistance Needed: assisted transfers to wheelchair, uses wheelchair mostly per patient  ADL's / Homemaking Assistance Needed: assisted by ALF staff        Hand Dominance        Extremity/Trunk Assessment   Upper Extremity Assessment Upper Extremity Assessment: Generalized weakness    Lower Extremity Assessment Lower Extremity Assessment: Generalized weakness    Cervical / Trunk Assessment Cervical / Trunk Assessment: Normal  Communication   Communication: No difficulties  Cognition Arousal/Alertness: Awake/alert Behavior During Therapy: WFL for tasks assessed/performed Overall Cognitive Status: History of cognitive impairments - at baseline                                        General Comments      Exercises     Assessment/Plan    PT Assessment Patient needs continued PT services  PT Problem List Decreased strength;Decreased activity tolerance;Decreased balance;Decreased mobility       PT Treatment Interventions Gait training;Functional mobility training;Therapeutic activities;Therapeutic exercise;Patient/family education    PT Goals (Current goals can be found in the Care Plan section)  Acute Rehab PT Goals Patient Stated Goal: return home PT Goal Formulation: With patient Time For Goal Achievement: 12/22/17 Potential to Achieve Goals: Good    Frequency  Min 3X/week   Barriers to discharge        Co-evaluation               AM-PAC PT "6 Clicks" Daily Activity  Outcome Measure Difficulty turning over in bed (including adjusting bedclothes, sheets and blankets)?: A Little Difficulty moving from lying on back to sitting on the side of the bed? : A Lot Difficulty sitting down on and standing up from a chair with arms (e.g., wheelchair, bedside commode, etc,.)?: A Lot Help needed moving to and from a bed to chair (including a wheelchair)?: A Lot Help needed walking in hospital room?: Total Help needed climbing 3-5 steps with a railing? : Total 6 Click Score: 11    End of Session   Activity Tolerance: Patient tolerated treatment well;Patient limited by fatigue Patient left: in chair;with call bell/phone within reach;with chair alarm set Nurse Communication: Mobility status;Other (comment)(RN aware that patient left up in chair) PT Visit Diagnosis: Unsteadiness on  feet (R26.81);Other abnormalities of gait and mobility (R26.89);Muscle weakness (generalized) (M62.81)    Time: 4758-3074 PT Time Calculation (min) (ACUTE ONLY): 28 min   Charges:   PT Evaluation $PT Eval Moderate Complexity: 1 Mod PT Treatments $Therapeutic Activity: 23-37 mins        3:59 PM, 12/08/17 Lonell Grandchild, MPT Physical Therapist with [facility] [phone] office [phone] mobile phone

## 2017-12-08 NOTE — Plan of Care (Signed)
  Problem: Acute Rehab PT Goals(only PT should resolve) Goal: Pt Will Go Supine/Side To Sit Outcome: Progressing Flowsheets (Taken 12/08/2017 1600) Pt will go Supine/Side to Sit: with minimal assist; with moderate assist Goal: Patient Will Transfer Sit To/From Stand Outcome: Progressing Flowsheets (Taken 12/08/2017 1600) Patient will transfer sit to/from stand: with moderate assist Goal: Pt Will Transfer Bed To Chair/Chair To Bed Outcome: Progressing Flowsheets (Taken 12/08/2017 1600) Pt will Transfer Bed to Chair/Chair to Bed: with mod assist Goal: Pt Will Ambulate Outcome: Progressing Flowsheets (Taken 12/08/2017 1600) Pt will Ambulate: with moderate assist; with rolling walker; 15 feet   4:01 PM, 12/08/17 Lonell Grandchild, MPT Physical Therapist with [facility] [phone] office [phone] mobile phone

## 2017-12-08 NOTE — Progress Notes (Signed)
Inpatient Diabetes Program Recommendations  AACE/ADA: New Consensus Statement on Inpatient Glycemic Control (2015)  Target Ranges:  Prepandial:   less than 140 mg/dL      Peak postprandial:   less than 180 mg/dL (1-2 hours)      Critically ill patients:  140 - 180 mg/dL   Results for ARIANIS, BOWDITCH (MRN [id number]) as of 12/08/2017 07:48  Ref. Range 12/07/2017 07:21 12/07/2017 11:24 12/07/2017 16:56 12/07/2017 22:22  Glucose-Capillary Latest Ref Range: 70 - 99 mg/dL 190 (H) 332 (H) 277 (H) 271 (H)   Results for GILIANA, VANTIL (MRN [id number]) as of 12/08/2017 07:48  Ref. Range 12/08/2017 03:35 12/08/2017 07:33  Glucose-Capillary Latest Ref Range: 70 - 99 mg/dL 243 (H) 241 (H)    Admitwith Glucose 621 mg/dl/ HHNK/ New Diagnosis of DM  PCP: Dr. Luan Pulling in [place].   Resides at [facility]  Current Orders: Lantus 14 units Daily Novolog Sensitive Correction Scale/ SSI (0-9 units) TID AC + HS      MD- Please consider the following in-hospital insulin adjustments:  1. Increase Lantus to 18 units Daily  2. Start Novolog Meal Coverage: Novolog 4 units TID with meals  (Please add the following Hold Parameters: Hold if pt eats <50% of meal, Hold if pt NPO)    --Will follow patient during hospitalization--  Wyn Quaker RN, MSN, CDE Diabetes Coordinator Inpatient Glycemic Control Team Team Pager: [phone] (8a-5p)

## 2017-12-09 LAB — CBC WITH DIFFERENTIAL/PLATELET
Abs Immature Granulocytes: 0.03 10*3/uL (ref 0.00–0.07)
BASOS ABS: 0 10*3/uL (ref 0.0–0.1)
Basophils Relative: 0 %
EOS PCT: 2 %
Eosinophils Absolute: 0.1 10*3/uL (ref 0.0–0.5)
HCT: 32.6 % — ABNORMAL LOW (ref 36.0–46.0)
HEMOGLOBIN: 10.3 g/dL — AB (ref 12.0–15.0)
IMMATURE GRANULOCYTES: 1 %
LYMPHS PCT: 37 %
Lymphs Abs: 1.8 10*3/uL (ref 0.7–4.0)
MCH: 28 pg (ref 26.0–34.0)
MCHC: 31.6 g/dL (ref 30.0–36.0)
MCV: 88.6 fL (ref 80.0–100.0)
Monocytes Absolute: 0.6 10*3/uL (ref 0.1–1.0)
Monocytes Relative: 11 %
NEUTROS PCT: 49 %
NRBC: 0 % (ref 0.0–0.2)
Neutro Abs: 2.5 10*3/uL (ref 1.7–7.7)
Platelets: 226 10*3/uL (ref 150–400)
RBC: 3.68 MIL/uL — AB (ref 3.87–5.11)
RDW: 16.5 % — ABNORMAL HIGH (ref 11.5–15.5)
WBC: 5 10*3/uL (ref 4.0–10.5)

## 2017-12-09 LAB — BASIC METABOLIC PANEL
ANION GAP: 5 (ref 5–15)
BUN: 16 mg/dL (ref 8–23)
CHLORIDE: 107 mmol/L (ref 98–111)
CO2: 23 mmol/L (ref 22–32)
CREATININE: 1.17 mg/dL — AB (ref 0.44–1.00)
Calcium: 9.1 mg/dL (ref 8.9–10.3)
GFR calc non Af Amer: 38 mL/min — ABNORMAL LOW (ref >60)
GFR, EST AFRICAN AMERICAN: 43 mL/min — AB (ref >60)
Glucose, Bld: 136 mg/dL — ABNORMAL HIGH (ref 70–99)
POTASSIUM: 4.2 mmol/L (ref 3.5–5.1)
SODIUM: 135 mmol/L (ref 135–145)

## 2017-12-09 LAB — GLUCOSE, CAPILLARY
GLUCOSE-CAPILLARY: 146 mg/dL — AB (ref 70–99)
GLUCOSE-CAPILLARY: 341 mg/dL — AB (ref 70–99)
Glucose-Capillary: 248 mg/dL — ABNORMAL HIGH (ref 70–99)
Glucose-Capillary: 170 mg/dL — ABNORMAL HIGH (ref 70–99)

## 2017-12-09 NOTE — Progress Notes (Signed)
Inpatient Diabetes Program Recommendations  AACE/ADA: New Consensus Statement on Inpatient Glycemic Control (2019)  Target Ranges:  Prepandial:   less than 140 mg/dL      Peak postprandial:   less than 180 mg/dL (1-2 hours)      Critically ill patients:  140 - 180 mg/dL   Results for MIRIANA, GAERTNER (MRN [id number]) as of 12/09/2017 10:47  Ref. Range 12/08/2017 07:33 12/08/2017 11:11 12/08/2017 16:25 12/08/2017 21:48 12/09/2017 07:36  Glucose-Capillary Latest Ref Range: 70 - 99 mg/dL 241 (H)  *No Novolog SSI given due to order change 301 (H)  Novolog 11 units  Lantus 18 units 297 (H)  Novolog 8 units 154 (H) 146 (H)  Novolog 2 units  Lantus 18 units   Review of Glycemic Control  Diabetes history: NO Outpatient Diabetes medications: NA Current orders for Inpatient glycemic control: Lantus 18 units daily, Novolog 0-15 units TID with meals, Novolog 0-5 units QHS  Inpatient Diabetes Program Recommendations: Insulin - Meal Coverage: If post prandial glucose is consistently greater than 180 mg/dl, please consider ordering Novolog 3 units TID with meals for meal coverage if patient eats at least 50% of meals.  Thanks, Barnie Alderman, RN, MSN, CDE Diabetes Coordinator Inpatient Diabetes Program [phone] (Team Pager from 8am to 5pm)

## 2017-12-09 NOTE — Progress Notes (Signed)
Pharmacy Antibiotic Note  Christine Hancock is a 84 y.o. female admitted on 12/05/2017 with sepsis.  Pharmacy has been consulted for Zosyn dosing.  Plan: Continue Zosyn 3.375g IV every 8 hours  Monitor labs, c/s, and patient improvement.  Height: 5\' 2"  (157.5 cm) Weight: 173 lb 8 oz (78.7 kg) IBW/kg (Calculated) : 50.1  Temp (24hrs), Avg:98 F (36.7 C), Min:97.9 F (36.6 C), Max:98 F (36.7 C)  Recent Labs  Lab 12/05/17 0922 12/05/17 1129 12/06/17 0339 12/07/17 0402 12/08/17 0453 12/09/17 0448  WBC 4.9  --  6.0 4.9 5.9 5.0  CREATININE 1.62*  --  1.33* 1.13* 1.20* 1.17*  LATICACIDVEN  --  0.80  --   --   --   --     Estimated Creatinine Clearance: 26.1 mL/min (A) (by C-G formula based on SCr of 1.17 mg/dL (H)).    No Known Allergies  Antimicrobials this admission: Vanco 10/14 >> 10/16 Zosyn 10/14 >>    Microbiology results: 10/14 BCx: ngtd 10/14 UCx: multiple species: suggest recollection 10/14 MRSA PCR: negative  Thank you for allowing pharmacy to be a part of this patient's care.  Ramond Craver 12/09/2017 11:31 AM

## 2017-12-09 NOTE — Progress Notes (Signed)
Subjective: She says she feels better.  She says her breathing is better.  She has no new complaints.  Blood sugar still variable but better.  Kidney function doing well. Objective: Vital signs in last 24 hours: Temp:  [97.9 F (36.6 C)-98 F (36.7 C)] 97.9 F (36.6 C) (10/18 0630) Pulse Rate:  [87-96] 87 (10/18 0630) Resp:  [16-18] 18 (10/18 0630) BP: (128-166)/(72-80) 136/80 (10/18 0630) SpO2:  [97 %-100 %] 97 % (10/18 0754) Weight change:  Last BM Date: 12/07/17  Intake/Output from previous day: 10/17 0701 - 10/18 0700 In: 993 [P.O.:720; I.V.:131.6; IV Piggyback:141.4] Out: 950 [Urine:950]  PHYSICAL EXAM General appearance: alert, cooperative, no distress and Much more alert Resp: clear to auscultation bilaterally Cardio: regular rate and rhythm, S1, S2 normal, no murmur, click, rub or gallop GI: soft, non-tender; bowel sounds normal; no masses,  no organomegaly Extremities: extremities normal, atraumatic, no cyanosis or edema  Lab Results:  Results for orders placed or performed during the hospital encounter of 12/05/17 (from the past 48 hour(s))  Glucose, capillary     Status: Abnormal   Collection Time: 12/07/17 11:24 AM  Result Value Ref Range   Glucose-Capillary 332 (H) 70 - 99 mg/dL   Comment 1 Notify RN    Comment 2 Document in Chart   Glucose, capillary     Status: Abnormal   Collection Time: 12/07/17  4:56 PM  Result Value Ref Range   Glucose-Capillary 277 (H) 70 - 99 mg/dL   Comment 1 Notify RN    Comment 2 Document in Chart   Glucose, capillary     Status: Abnormal   Collection Time: 12/07/17 10:22 PM  Result Value Ref Range   Glucose-Capillary 271 (H) 70 - 99 mg/dL   Comment 1 Notify RN    Comment 2 Document in Chart   Glucose, capillary     Status: Abnormal   Collection Time: 12/08/17  3:35 AM  Result Value Ref Range   Glucose-Capillary 243 (H) 70 - 99 mg/dL  Basic metabolic panel     Status: Abnormal   Collection Time: 12/08/17  4:53 AM  Result  Value Ref Range   Sodium 133 (L) 135 - 145 mmol/L   Potassium 4.3 3.5 - 5.1 mmol/L   Chloride 105 98 - 111 mmol/L   CO2 22 22 - 32 mmol/L   Glucose, Bld 269 (H) 70 - 99 mg/dL   BUN 18 8 - 23 mg/dL   Creatinine, Ser 1.20 (H) 0.44 - 1.00 mg/dL   Calcium 9.3 8.9 - 10.3 mg/dL   GFR calc non Af Amer 36 (L) >60 mL/min   GFR calc Af Amer 42 (L) >60 mL/min    Comment: (NOTE) The eGFR has been calculated using the CKD EPI equation. This calculation has not been validated in all clinical situations. eGFR's persistently <60 mL/min signify possible Chronic Kidney Disease.    Anion gap 6 5 - 15    Comment: Performed at [facility], [address], [place], [place] [address]  CBC WITH DIFFERENTIAL     Status: Abnormal   Collection Time: 12/08/17  4:53 AM  Result Value Ref Range   WBC 5.9 4.0 - 10.5 K/uL   RBC 3.93 3.87 - 5.11 MIL/uL   Hemoglobin 10.7 (L) 12.0 - 15.0 g/dL   HCT 34.2 (L) 36.0 - 46.0 %   MCV 87.0 80.0 - 100.0 fL   MCH 27.2 26.0 - 34.0 pg   MCHC 31.3 30.0 - 36.0 g/dL  RDW 16.4 (H) 11.5 - 15.5 %   Platelets 216 150 - 400 K/uL   nRBC 0.0 0.0 - 0.2 %   Neutrophils Relative % 66 %   Neutro Abs 3.9 1.7 - 7.7 K/uL   Lymphocytes Relative 22 %   Lymphs Abs 1.3 0.7 - 4.0 K/uL   Monocytes Relative 9 %   Monocytes Absolute 0.5 0.1 - 1.0 K/uL   Eosinophils Relative 2 %   Eosinophils Absolute 0.1 0.0 - 0.5 K/uL   Basophils Relative 0 %   Basophils Absolute 0.0 0.0 - 0.1 K/uL   Immature Granulocytes 1 %   Abs Immature Granulocytes 0.04 0.00 - 0.07 K/uL    Comment: Performed at [facility], [address], [place], [place] [address]  Glucose, capillary     Status: Abnormal   Collection Time: 12/08/17  7:33 AM  Result Value Ref Range   Glucose-Capillary 241 (H) 70 - 99 mg/dL  Glucose, capillary     Status: Abnormal   Collection Time: 12/08/17 11:11 AM  Result Value Ref Range   Glucose-Capillary 301 (H) 70 - 99 mg/dL  Glucose, capillary     Status: Abnormal   Collection Time:  12/08/17  4:25 PM  Result Value Ref Range   Glucose-Capillary 297 (H) 70 - 99 mg/dL  Glucose, capillary     Status: Abnormal   Collection Time: 12/08/17  9:48 PM  Result Value Ref Range   Glucose-Capillary 154 (H) 70 - 99 mg/dL  Basic metabolic panel     Status: Abnormal   Collection Time: 12/09/17  4:48 AM  Result Value Ref Range   Sodium 135 135 - 145 mmol/L   Potassium 4.2 3.5 - 5.1 mmol/L   Chloride 107 98 - 111 mmol/L   CO2 23 22 - 32 mmol/L   Glucose, Bld 136 (H) 70 - 99 mg/dL   BUN 16 8 - 23 mg/dL   Creatinine, Ser 1.17 (H) 0.44 - 1.00 mg/dL   Calcium 9.1 8.9 - 10.3 mg/dL   GFR calc non Af Amer 38 (L) >60 mL/min   GFR calc Af Amer 43 (L) >60 mL/min    Comment: (NOTE) The eGFR has been calculated using the CKD EPI equation. This calculation has not been validated in all clinical situations. eGFR's persistently <60 mL/min signify possible Chronic Kidney Disease.    Anion gap 5 5 - 15    Comment: Performed at [facility], [address] [place], [place] [address]  CBC WITH DIFFERENTIAL     Status: Abnormal   Collection Time: 12/09/17  4:48 AM  Result Value Ref Range   WBC 5.0 4.0 - 10.5 K/uL   RBC 3.68 (L) 3.87 - 5.11 MIL/uL   Hemoglobin 10.3 (L) 12.0 - 15.0 g/dL   HCT 32.6 (L) 36.0 - 46.0 %   MCV 88.6 80.0 - 100.0 fL   MCH 28.0 26.0 - 34.0 pg   MCHC 31.6 30.0 - 36.0 g/dL   RDW 16.5 (H) 11.5 - 15.5 %   Platelets 226 150 - 400 K/uL   nRBC 0.0 0.0 - 0.2 %   Neutrophils Relative % 49 %   Neutro Abs 2.5 1.7 - 7.7 K/uL   Lymphocytes Relative 37 %   Lymphs Abs 1.8 0.7 - 4.0 K/uL   Monocytes Relative 11 %   Monocytes Absolute 0.6 0.1 - 1.0 K/uL   Eosinophils Relative 2 %   Eosinophils Absolute 0.1 0.0 - 0.5 K/uL   Basophils Relative 0 %  Basophils Absolute 0.0 0.0 - 0.1 K/uL   Immature Granulocytes 1 %   Abs Immature Granulocytes 0.03 0.00 - 0.07 K/uL    Comment: Performed at [facility], [address] [place], [place] [address]  Glucose, capillary     Status:  Abnormal   Collection Time: 12/09/17  7:36 AM  Result Value Ref Range   Glucose-Capillary 146 (H) 70 - 99 mg/dL    ABGS No results for input(s): PHART, PO2ART, TCO2, HCO3 in the last 72 hours.  Invalid input(s): PCO2 CULTURES Recent Results (from the past 240 hour(s))  Urine culture     Status: Abnormal   Collection Time: 12/05/17  9:16 AM  Result Value Ref Range Status   Specimen Description   Final    URINE, CATHETERIZED Performed at [facility], [address] [place], [place] [address]    Special Requests   Final    NONE Performed at [facility], [address], [place], [place] [address]    Culture MULTIPLE SPECIES PRESENT, SUGGEST RECOLLECTION (A)  Final   Report Status 12/06/2017 FINAL  Final  Culture, blood (routine x 2)     Status: None (Preliminary result)   Collection Time: 12/05/17  9:22 AM  Result Value Ref Range Status   Specimen Description BLOOD RIGHT ANTECUBITAL  Final   Special Requests   Final    BOTTLES DRAWN AEROBIC AND ANAEROBIC Blood Culture adequate volume   Culture   Final    NO GROWTH 4 DAYS Performed at [facility], [address], [place], [place] [address]    Report Status PENDING  Incomplete  Culture, blood (routine x 2)     Status: None (Preliminary result)   Collection Time: 12/05/17 10:14 AM  Result Value Ref Range Status   Specimen Description BLOOD BLOOD RIGHT FOREARM  Final   Special Requests   Final    BOTTLES DRAWN AEROBIC ONLY Blood Culture results may not be optimal due to an inadequate volume of blood received in culture bottles   Culture   Final    NO GROWTH 4 DAYS Performed at [facility], [address] [place], [place] [address]    Report Status PENDING  Incomplete  MRSA PCR Screening     Status: None   Collection Time: 12/05/17  5:00 PM  Result Value Ref Range Status   MRSA by PCR NEGATIVE NEGATIVE Final    Comment:        The GeneXpert MRSA Assay (FDA approved for NASAL specimens only), is one component of  a comprehensive MRSA colonization surveillance program. It is not intended to diagnose MRSA infection nor to guide or monitor treatment for MRSA infections. Performed at [facility], [address] [place],  [address]    Studies/Results: No results found.  Medications:  Prior to Admission:  Medications Prior to Admission  Medication Sig Dispense Refill Last Dose  . apixaban (ELIQUIS) 2.5 MG TABS tablet Take 1 tablet (2.5 mg total) by mouth 2 (two) times daily. 60 tablet 5 12/04/2017 at Unknown time  . carboxymethylcellulose (REFRESH PLUS) 0.5 % SOLN Place 1 drop into both eyes 4 (four) times daily.   12/05/2017 at Unknown time  . cephALEXin (KEFLEX) 500 MG capsule Take 500 mg by mouth 3 (three) times daily.   12/05/2017 at Unknown time  . dextromethorphan-guaiFENesin (ROBITUSSIN-DM) 10-100 MG/5ML liquid Take 5 mLs by mouth every 4 (four) hours as needed for cough.   unknown  . ferrous sulfate 325 (65 FE) MG tablet Take  1 tablet (325 mg total) by mouth daily. 30 tablet 3 12/04/2017 at Unknown time  . furosemide (LASIX) 20 MG tablet Take 20 mg by mouth. Give  3 tablets by mouth one time a day for  CHF   12/04/2017 at Unknown time  . furosemide (LASIX) 40 MG tablet TAKE 1 TABLET BY MOUTH TWICE DAILY. (Patient taking differently: Take 40 mg by mouth 2 (two) times daily. Give one tablet by mouth at bedtime for CHF) 180 tablet 0 12/04/2017 at Unknown time  . levothyroxine (SYNTHROID, LEVOTHROID) 88 MCG tablet Take 88 mcg by mouth daily before breakfast.   12/05/2017 at Unknown time  . LINZESS 290 MCG CAPS capsule Take 290 mcg by mouth daily before breakfast.    12/05/2017 at Unknown time  . neomycin-polymyxin-dexameth (MAXITROL) 0.1 % OINT Place 1 application into both eyes 3 (three) times daily. For dry eyes   12/05/2017 at Unknown time  . NIFEdipine (PROCARDIA-XL/ADALAT CC) 30 MG 24 hr tablet Take 30 mg by mouth daily.   12/04/2017 at Unknown time  . omeprazole (PRILOSEC) 20 MG  capsule Take 20 mg by mouth 2 (two) times daily before a meal.    12/04/2017 at Unknown time  . oxybutynin (DITROPAN XL) 15 MG 24 hr tablet Take 15 mg by mouth daily.   12/04/2017 at Unknown time  . polyethylene glycol powder (GLYCOLAX/MIRALAX) powder Take 8.6 g by mouth daily.    12/05/2017 at Unknown time  . pravastatin (PRAVACHOL) 40 MG tablet Take 40 mg by mouth at bedtime.    12/04/2017 at Unknown time  . risperiDONE (RISPERDAL) 0.25 MG tablet Take 0.25 mg by mouth daily.   12/04/2017 at Unknown time  . risperiDONE (RISPERDAL) 0.5 MG tablet Take 0.5 mg by mouth at bedtime.   12/04/2017 at Unknown time  . rOPINIRole (REQUIP) 0.5 MG tablet Take 0.5 mg by mouth at bedtime.   12/04/2017 at Unknown time  . traZODone (DESYREL) 50 MG tablet Take 25 mg by mouth at bedtime.   12/04/2017 at Unknown time  . umeclidinium-vilanterol (ANORO ELLIPTA) 62.5-25 MCG/INH AEPB Inhale 1 puff into the lungs daily.   12/04/2017 at Unknown time  . acetaminophen (TYLENOL) 325 MG tablet Take 650 mg by mouth every 6 (six) hours as needed for mild pain or moderate pain.    unknown  . albuterol (PROVENTIL HFA;VENTOLIN HFA) 108 (90 Base) MCG/ACT inhaler Inhale 2 puffs into the lungs every 6 (six) hours as needed for wheezing or shortness of breath. 1 Inhaler 2 unknown  . gabapentin (NEURONTIN) 600 MG tablet Take 600 mg by mouth at bedtime.    Not Taking at Unknown time  . ketoconazole (NIZORAL) 2 % shampoo Apply 1 application topically See admin instructions. Once weekly As needed for antifungal.   unknown  . ketorolac (TORADOL) 10 MG tablet Take 10 mg by mouth every 6 (six) hours as needed for moderate pain (to hand).    unknown  . silver sulfADIAZINE (SILVADENE) 1 % cream Apply 1 application topically daily. Left toe   unknown  . trolamine salicylate (ASPERCREME) 10 % cream Apply 1 application topically every 12 (twelve) hours as needed for muscle pain.    unknown   Scheduled: . apixaban  2.5 mg Oral BID  . ferrous  sulfate  325 mg Oral Daily  . gabapentin  600 mg Oral QHS  . insulin aspart  0-15 Units Subcutaneous TID WC  . insulin aspart  0-5 Units Subcutaneous QHS  . insulin glargine  18 Units  Subcutaneous Daily  . levothyroxine  88 mcg Oral QAC breakfast  . linaclotide  290 mcg Oral QAC breakfast  . NIFEdipine  30 mg Oral Daily  . oxybutynin  15 mg Oral Daily  . polyethylene glycol  17 g Oral Daily  . pravastatin  40 mg Oral q1800  . risperiDONE  0.25 mg Oral Daily  . risperiDONE  0.5 mg Oral QHS  . rOPINIRole  0.5 mg Oral QHS  . traZODone  25 mg Oral QHS  . umeclidinium-vilanterol  1 puff Inhalation Daily   Continuous: . sodium chloride 10 mL/hr at 12/09/17 0400  . famotidine (PEPCID) IV Stopped (12/08/17 1520)  . piperacillin-tazobactam (ZOSYN)  IV 3.375 g (12/09/17 0554)   WCB:JSEGBT chloride, acetaminophen **OR** acetaminophen, iopamidol, ondansetron **OR** ondansetron (ZOFRAN) IV, polyethylene glycol, polyvinyl alcohol  Assesment: She was admitted with nonketotic hyperosmolar coma with new onset of diabetes.  She was being treated for urinary tract infection at her skilled care facility prior to this and that may have precipitated the event.  She developed some altered mental status and was dehydrated.  She was hypothermic.  She also has right lower lobe pneumonia which is being treated.  She has improved substantially this morning.  Her hypothermia has resolved  She had acute kidney injury that is better  She has chronic diastolic heart failure and she is off IV fluids now  He was dehydrated which is better Principal Problem:   Non-ketotic hyperosmolar coma ([place]) Active Problems:   Dehydration   Failure to thrive in adult   Hypothyroidism   Acute lower UTI   AKI (acute kidney injury) ([place])   Chronic diastolic CHF (congestive heart failure) (HCC)   Chronic kidney disease (CKD), stage III (moderate) (HCC)   GERD (gastroesophageal reflux disease)   Hypothermia   DNR (do not  resuscitate)    Plan: Continue treatments.  I think she will probably need to remain in the hospital for another 48 hours or so and plan to return to her assisted living facility 12/12/2017    LOS: 4 days   Cathryn Gallery L 12/09/2017, 8:32 AM

## 2017-12-10 LAB — BASIC METABOLIC PANEL
ANION GAP: 7 (ref 5–15)
BUN: 17 mg/dL (ref 8–23)
CALCIUM: 8.9 mg/dL (ref 8.9–10.3)
CO2: 24 mmol/L (ref 22–32)
Chloride: 105 mmol/L (ref 98–111)
Creatinine, Ser: 1.12 mg/dL — ABNORMAL HIGH (ref 0.44–1.00)
GFR calc non Af Amer: 40 mL/min — ABNORMAL LOW (ref >60)
GFR, EST AFRICAN AMERICAN: 46 mL/min — AB (ref >60)
GLUCOSE: 161 mg/dL — AB (ref 70–99)
Potassium: 4.1 mmol/L (ref 3.5–5.1)
Sodium: 136 mmol/L (ref 135–145)

## 2017-12-10 LAB — GLUCOSE, CAPILLARY
GLUCOSE-CAPILLARY: 237 mg/dL — AB (ref 70–99)
Glucose-Capillary: 150 mg/dL — ABNORMAL HIGH (ref 70–99)
Glucose-Capillary: 380 mg/dL — ABNORMAL HIGH (ref 70–99)
Glucose-Capillary: 163 mg/dL — ABNORMAL HIGH (ref 70–99)

## 2017-12-10 LAB — CULTURE, BLOOD (ROUTINE X 2)
Culture: NO GROWTH
Culture: NO GROWTH
SPECIAL REQUESTS: ADEQUATE

## 2017-12-10 NOTE — Progress Notes (Addendum)
Christine Hancock MRN:[id number] DOB: 06/29/1917 DOA: 12/05/2017 PCP: Hawkins, Edward, MD   Physical Exam: Blood pressure 129/77, pulse 84, temperature 97.6 F (36.4 C), temperature source Oral, resp. rate 17, height 5' 2" (1.575 m), weight 78.7 kg, SpO2 100 %.  Lungs prolonged expiratory phase no expiratory wheeze no rales audible heart irregular rhythm no S3 no heaves thrills rubs abdomen soft nontender bowel sounds normoactive   Investigations:  Recent Results (from the past 240 hour(s))  Urine culture     Status: Abnormal   Collection Time: 12/05/17  9:16 AM  Result Value Ref Range Status   Specimen Description   Final    URINE, CATHETERIZED Performed at [facility], [address] [place], [place] [address]    Special Requests   Final    NONE Performed at [facility], [address] [place], [place] [address]    Culture MULTIPLE SPECIES PRESENT, SUGGEST RECOLLECTION (A)  Final   Report Status 12/06/2017 FINAL  Final  Culture, blood (routine x 2)     Status: None   Collection Time: 12/05/17  9:22 AM  Result Value Ref Range Status   Specimen Description BLOOD RIGHT ANTECUBITAL  Final   Special Requests   Final    BOTTLES DRAWN AEROBIC AND ANAEROBIC Blood Culture adequate volume   Culture   Final    NO GROWTH 5 DAYS Performed at [facility], [address] [place], [place] [address]    Report Status 12/10/2017 FINAL  Final  Culture, blood (routine x 2)     Status: None   Collection Time: 12/05/17 10:14 AM  Result Value Ref Range Status   Specimen Description BLOOD BLOOD RIGHT FOREARM  Final   Special Requests   Final    BOTTLES DRAWN AEROBIC ONLY Blood Culture results may not be optimal due to an inadequate volume of blood received in culture bottles   Culture   Final    NO GROWTH 5 DAYS Performed at [facility], [address] [place], [place] [address]    Report Status 12/10/2017 FINAL  Final  MRSA PCR Screening     Status: None   Collection Time: 12/05/17  5:00 PM   Result Value Ref Range Status   MRSA by PCR NEGATIVE NEGATIVE Final    Comment:        The GeneXpert MRSA Assay (FDA approved for NASAL specimens only), is one component of a comprehensive MRSA colonization surveillance program. It is not intended to diagnose MRSA infection nor to guide or monitor treatment for MRSA infections. Performed at [facility], [address] [place], [place] [address]      Basic Metabolic Panel: Recent Labs    12/08/17 0453 12/09/17 0448  NA 133* 135  K 4.3 4.2  CL 105 107  CO2 22 23  GLUCOSE 269* 136*  BUN 18 16  CREATININE 1.20* 1.17*  CALCIUM 9.3 9.1   Liver Function Tests: No results for input(s): AST, ALT, ALKPHOS, BILITOT, PROT, ALBUMIN in the last 72 hours.   CBC: Recent Labs    12/08/17 0453 12/09/17 0448  WBC 5.9 5.0  NEUTROABS 3.9 2.5  HGB 10.7* 10.3*  HCT 34.2* 32.6*  MCV 87.0 88.6  PLT 216 226    No results found.    Medications:   Impression:  Principal Problem:   Non-ketotic hyperosmolar coma (HCC) Active Problems:   Dehydration   Failure to thrive in adult   Hypothyroidism   Acute lower UTI   AKI (acute kidney   injury) (HCC)   Chronic diastolic CHF (congestive heart failure) (HCC)   Chronic kidney disease (CKD), stage III (moderate) (HCC)   GERD (gastroesophageal reflux disease)   Hypothermia   DNR (do not resuscitate)     Plan: Continue 18 units of Lantus at bedtime for another 24 hours as well as sliding scale we will calculate insulin requirements when there is more of an equilibrium achieved in this new diabetic.  Patient appears hemodynamically stable we will continue current therapy and monitor be met in a.m. daily.  Continue Zosyn  Consultants: Diabetic nurse specialist   Procedures  Antibiotics:     Time spent: 30 minutes   LOS: 5 days   , M   12/10/2017, 7:12 AM    

## 2017-12-11 LAB — BASIC METABOLIC PANEL
Anion gap: 4 — ABNORMAL LOW (ref 5–15)
BUN: 16 mg/dL (ref 8–23)
CHLORIDE: 105 mmol/L (ref 98–111)
CO2: 25 mmol/L (ref 22–32)
Calcium: 8.8 mg/dL — ABNORMAL LOW (ref 8.9–10.3)
Creatinine, Ser: 0.99 mg/dL (ref 0.44–1.00)
GFR calc non Af Amer: 46 mL/min — ABNORMAL LOW (ref >60)
GFR, EST AFRICAN AMERICAN: 53 mL/min — AB (ref >60)
GLUCOSE: 124 mg/dL — AB (ref 70–99)
POTASSIUM: 4 mmol/L (ref 3.5–5.1)
Sodium: 134 mmol/L — ABNORMAL LOW (ref 135–145)

## 2017-12-11 LAB — GLUCOSE, CAPILLARY
GLUCOSE-CAPILLARY: 128 mg/dL — AB (ref 70–99)
Glucose-Capillary: 225 mg/dL — ABNORMAL HIGH (ref 70–99)
Glucose-Capillary: 176 mg/dL — ABNORMAL HIGH (ref 70–99)
Glucose-Capillary: 162 mg/dL — ABNORMAL HIGH (ref 70–99)

## 2017-12-11 NOTE — Progress Notes (Signed)
Fasting blood sugar was 128 this morning 18 units of Lantus we will leave this dosage as it is we do not want to overshoot in this new insulin-dependent diabetic.  Continue sliding scale coverage Christine Hancock:[id number] DOB: 12-Dec-1917 DOA: 12/05/2017 PCP: Sinda Du, MD   Physical Exam: Blood pressure 129/71, pulse 84, temperature 97.9 F (36.6 C), temperature source Oral, resp. rate 16, height 5\' 2"  (1.575 m), weight 78.7 kg, SpO2 98 %.  Lungs prolonged expiratory phase no rales wheeze rhonchi.  Heart regular rhythm no S3-S4 no heaves thrills rubs.  Abdomen soft nontender bowel sounds normoactive   Investigations:  Recent Results (from the past 240 hour(s))  Urine culture     Status: Abnormal   Collection Time: 12/05/17  9:16 AM  Result Value Ref Range Status   Specimen Description   Final    URINE, CATHETERIZED Performed at [facility], [address] [place], [place] [address]    Special Requests   Final    NONE Performed at [facility], [address] [place], [place] [address]    Culture MULTIPLE SPECIES PRESENT, SUGGEST RECOLLECTION (A)  Final   Report Status 12/06/2017 FINAL  Final  Culture, blood (routine x 2)     Status: None   Collection Time: 12/05/17  9:22 AM  Result Value Ref Range Status   Specimen Description BLOOD RIGHT ANTECUBITAL  Final   Special Requests   Final    BOTTLES DRAWN AEROBIC AND ANAEROBIC Blood Culture adequate volume   Culture   Final    NO GROWTH 5 DAYS Performed at [facility], [address], [place], [place] [address]    Report Status 12/10/2017 FINAL  Final  Culture, blood (routine x 2)     Status: None   Collection Time: 12/05/17 10:14 AM  Result Value Ref Range Status   Specimen Description BLOOD BLOOD RIGHT FOREARM  Final   Special Requests   Final    BOTTLES DRAWN AEROBIC ONLY Blood Culture results may not be optimal due to an inadequate volume of blood received in culture bottles   Culture   Final    NO GROWTH 5  DAYS Performed at [facility], [address] [place], [place] [address]    Report Status 12/10/2017 FINAL  Final  MRSA PCR Screening     Status: None   Collection Time: 12/05/17  5:00 PM  Result Value Ref Range Status   MRSA by PCR NEGATIVE NEGATIVE Final    Comment:        The GeneXpert MRSA Assay (FDA approved for NASAL specimens only), is one component of a comprehensive MRSA colonization surveillance program. It is not intended to diagnose MRSA infection nor to guide or monitor treatment for MRSA infections. Performed at [facility], [address], [place],  [address]      Basic Metabolic Panel: Recent Labs    12/10/17 0643 12/11/17 0619  NA 136 134*  K 4.1 4.0  CL 105 105  CO2 24 25  GLUCOSE 161* 124*  BUN 17 16  CREATININE 1.12* 0.99  CALCIUM 8.9 8.8*   Liver Function Tests: No results for input(s): AST, ALT, ALKPHOS, BILITOT, PROT, ALBUMIN in the last 72 hours.   CBC: Recent Labs    12/09/17 0448  WBC 5.0  NEUTROABS 2.5  HGB 10.3*  HCT 32.6*  MCV 88.6  PLT 226    No results found.    Medications:   Impression:  Principal Problem:   Non-ketotic hyperosmolar  coma ([place]) Active Problems:   Dehydration   Failure to thrive in adult   Hypothyroidism   Acute lower UTI   AKI (acute kidney injury) ([place])   Chronic diastolic CHF (congestive heart failure) (HCC)   Chronic kidney disease (CKD), stage III (moderate) (HCC)   GERD (gastroesophageal reflux disease)   Hypothermia   DNR (do not resuscitate)     Plan: Continue Lantus 18 units at bedtime assess long-term insulin needs  Consultants:    Procedures   Antibiotics:           Time spent: 30 minutes   LOS: 6 days   Rande Dario M   12/11/2017, 10:24 AM

## 2017-12-12 LAB — BASIC METABOLIC PANEL
Anion gap: 7 (ref 5–15)
BUN: 16 mg/dL (ref 8–23)
CO2: 23 mmol/L (ref 22–32)
CREATININE: 1.13 mg/dL — AB (ref 0.44–1.00)
Calcium: 9.3 mg/dL (ref 8.9–10.3)
Chloride: 105 mmol/L (ref 98–111)
GFR calc Af Amer: 45 mL/min — ABNORMAL LOW (ref >60)
GFR, EST NON AFRICAN AMERICAN: 39 mL/min — AB (ref >60)
GLUCOSE: 151 mg/dL — AB (ref 70–99)
Potassium: 4.5 mmol/L (ref 3.5–5.1)
SODIUM: 135 mmol/L (ref 135–145)

## 2017-12-12 LAB — GLUCOSE, CAPILLARY
GLUCOSE-CAPILLARY: 136 mg/dL — AB (ref 70–99)
Glucose-Capillary: 276 mg/dL — ABNORMAL HIGH (ref 70–99)
Glucose-Capillary: 169 mg/dL — ABNORMAL HIGH (ref 70–99)
Glucose-Capillary: 233 mg/dL — ABNORMAL HIGH (ref 70–99)

## 2017-12-12 MED ORDER — INSULIN GLARGINE 100 UNIT/ML ~~LOC~~ SOLN: 18.0000 [IU] | Freq: Every day | SUBCUTANEOUS | 11 refills | Status: AC

## 2017-12-12 NOTE — Clinical Social Work Note (Signed)
Pt medically stable for dc today per MD. Discussed with PT who stated that if pt can go back to ALF with [facility] PT, that would be appropriate. Spoke with pt's niece, Ivin Booty, by phone to update. She requests pt return to ALF if they feel they can manage her care. Otherwise, [facility] for SNF rehab was her next choice.   [employer] notified. They felt they could accept pt back. They will come and get her with portable O2. DC Summary and FL2 to be sent to [facility] once signed. Updated pt's niece and she is agreeable to the plan.  Updated pt's bedside RN. Also updated pt's RN CM who will arrange HH.  There are no other CSW needs for dc.

## 2017-12-12 NOTE — Care Management Important Message (Signed)
Important Message  Patient Details  Name: Christine Hancock MRN: [id number] Date of Birth: Mar 12, 1917   Medicare Important Message Given:  Yes    Shelda Altes 12/12/2017, 12:53 PM

## 2017-12-12 NOTE — NC FL2 (Signed)
[place] LEVEL OF CARE SCREENING TOOL     IDENTIFICATION  Patient Name: Christine Hancock Birthdate: 1917/08/08 Sex: female Admission Date (Current Location): 12/05/2017  [facility] and [place] Number:  [employer] and Address:  [place] [address], [place]      Provider Number: [id number]  Attending Physician Name and Address:  Sinda Du, MD  Relative Name and Phone Number:  [name] (niece) [phone]    Current Level of Care: Hospital Recommended Level of Care: [place] Prior Approval Number:    Date Approved/Denied:   PASRR Number:    Discharge Plan: Other (Comment)    Current Diagnoses: Patient Active Problem List   Diagnosis Date Noted  . Non-ketotic hyperosmolar coma ([place]) 12/05/2017  . GERD (gastroesophageal reflux disease) 12/05/2017  . Hypothermia 12/05/2017  . DNR (do not resuscitate) 12/05/2017  . Anemia due to chronic blood loss 08/28/2017  . Iron deficiency anemia due to chronic blood loss   . GI bleed 08/25/2017  . GIB (gastrointestinal bleeding) 08/24/2017  . Chronic diastolic CHF (congestive heart failure) ([place]) 08/24/2017  . Chronic kidney disease (CKD), stage III (moderate) ([place]) 08/24/2017  . Symptomatic anemia   . Acute on chronic diastolic heart failure ([place]) 10/30/2016  . Acute lower UTI 10/27/2016  . AKI (acute kidney injury) ([place]) 10/27/2016  . Dehydration 10/24/2011  . Acute on chronic renal failure ([place]) 10/24/2011  . Failure to thrive in adult 10/24/2011  . Microcytic anemia 10/24/2011  . Anxiety 10/24/2011  . Hypothyroidism 10/24/2011    Orientation RESPIRATION BLADDER Height & Weight     Self, Place  O2 Incontinent Weight: 173 lb 8 oz (78.7 kg) Height:  5\' 2"  (157.5 cm)  BEHAVIORAL SYMPTOMS/MOOD NEUROLOGICAL BOWEL NUTRITION STATUS      Incontinent Diet  AMBULATORY STATUS COMMUNICATION OF NEEDS Skin   Extensive Assist Verbally Normal                       Personal Care Assistance Level of Assistance  Bathing, Feeding, Dressing Bathing Assistance: Maximum assistance Feeding assistance: Independent Dressing Assistance: Maximum assistance     Functional Limitations Info  Sight, Hearing, Speech Sight Info: Adequate Hearing Info: Adequate Speech Info: Adequate    SPECIAL CARE FACTORS FREQUENCY                       Contractures Contractures Info: Not present    Additional Factors Info  Code Status, Allergies, Psychotropic Code Status Info: DNR Allergies Info: NKA Psychotropic Info: Risperdal         Current Medications (12/12/2017):  This is the current hospital active medication list Current Facility-Administered Medications  Medication Dose Route Frequency Provider Last Rate Last Dose  . 0.9 %  sodium chloride infusion   Intravenous PRN Sinda Du, MD 10 mL/hr at 12/09/17 0400    . acetaminophen (TYLENOL) tablet 650 mg  650 mg Oral Q6H PRN Sinda Du, MD       Or  . acetaminophen (TYLENOL) suppository 650 mg  650 mg Rectal Q6H PRN Sinda Du, MD      . apixaban [facility] [place]) tablet 2.5 mg  2.5 mg Oral BID Sinda Du, MD   2.5 mg at 12/12/17 0816  . famotidine (PEPCID) IVPB 20 mg premix  20 mg Intravenous Q24H Sinda Du, MD 100 mL/hr at 12/12/17 1251 20 mg at 12/12/17 1251  . ferrous sulfate tablet 325 mg  325  mg Oral Daily Sinda Du, MD   325 mg at 12/12/17 0816  . gabapentin (NEURONTIN) capsule 600 mg  600 mg Oral Elana Alm, MD   600 mg at 12/11/17 2044  . insulin aspart (novoLOG) injection 0-15 Units  0-15 Units Subcutaneous TID WC Sinda Du, MD   8 Units at 12/12/17 1136  . insulin aspart (novoLOG) injection 0-5 Units  0-5 Units Subcutaneous QHS Sinda Du, MD   2 Units at 12/10/17 2143  . insulin glargine (LANTUS) injection 18 Units  18 Units Subcutaneous Daily Sinda Du, MD   18 Units at 12/12/17 [phone]  . iopamidol (ISOVUE-300) 61 % injection 30 mL   30 mL Oral Once PRN Sinda Du, MD      . levothyroxine (SYNTHROID, LEVOTHROID) tablet 88 mcg  88 mcg Oral QAC breakfast Sinda Du, MD   88 mcg at 12/12/17 0816  . linaclotide (LINZESS) capsule 290 mcg  290 mcg Oral QAC breakfast Sinda Du, MD   290 mcg at 12/12/17 0815  . NIFEdipine (PROCARDIA-XL/NIFEDICAL-XL) 24 hr tablet 30 mg  30 mg Oral Daily Sinda Du, MD   30 mg at 12/12/17 0816  . ondansetron (ZOFRAN) tablet 4 mg  4 mg Oral Q6H PRN Sinda Du, MD       Or  . ondansetron [facility]) injection 4 mg  4 mg Intravenous Q6H PRN Sinda Du, MD      . oxybutynin (DITROPAN-XL) 24 hr tablet 15 mg  15 mg Oral Daily Sinda Du, MD   15 mg at 12/12/17 0816  . polyethylene glycol (MIRALAX / GLYCOLAX) packet 17 g  17 g Oral Daily PRN Sinda Du, MD      . polyethylene glycol (MIRALAX / GLYCOLAX) packet 17 g  17 g Oral Daily Sinda Du, MD   17 g at 12/12/17 0817  . polyvinyl alcohol (LIQUIFILM TEARS) 1.4 % ophthalmic solution 1 drop  1 drop Both Eyes PRN Sinda Du, MD      . pravastatin (PRAVACHOL) tablet 40 mg  40 mg Oral q1800 Sinda Du, MD   40 mg at 12/11/17 1717  . risperiDONE (RISPERDAL) tablet 0.25 mg  0.25 mg Oral Daily Sinda Du, MD   0.25 mg at 12/12/17 0816  . risperiDONE (RISPERDAL) tablet 0.5 mg  0.5 mg Oral QHS Sinda Du, MD   0.5 mg at 12/11/17 2045  . rOPINIRole (REQUIP) tablet 0.5 mg  0.5 mg Oral Elana Alm, MD   0.5 mg at 12/11/17 2044  . traZODone (DESYREL) tablet 25 mg  25 mg Oral Elana Alm, MD   25 mg at 12/11/17 2044  . umeclidinium-vilanterol (ANORO ELLIPTA) 62.5-25 MCG/INH 1 puff  1 puff Inhalation Daily Sinda Du, MD   1 puff at 12/12/17 0900     Discharge Medications:  STOP taking these medications   acetaminophen 325 MG tablet Commonly known as:  TYLENOL   cephALEXin 500 MG capsule Commonly known as:  KEFLEX   dextromethorphan-guaiFENesin 10-100 MG/5ML liquid Commonly  known as:  ROBITUSSIN-DM   ketoconazole 2 % shampoo Commonly known as:  NIZORAL   ketorolac 10 MG tablet Commonly known as:  TORADOL   rOPINIRole 0.5 MG tablet Commonly known as:  REQUIP   silver sulfADIAZINE 1 % cream Commonly known as:  SILVADENE   trolamine salicylate 10 % cream Commonly known as:  ASPERCREME     TAKE these medications   albuterol 108 (90 Base) MCG/ACT inhaler Commonly known as:  PROVENTIL HFA;VENTOLIN HFA Inhale 2 puffs  into the lungs every 6 (six) hours as needed for wheezing or shortness of breath.   ANORO ELLIPTA 62.5-25 MCG/INH Aepb Generic drug:  umeclidinium-vilanterol Inhale 1 puff into the lungs daily.   apixaban 2.5 MG Tabs tablet Commonly known as:  ELIQUIS Take 1 tablet (2.5 mg total) by mouth 2 (two) times daily.   carboxymethylcellulose 0.5 % Soln Commonly known as:  REFRESH PLUS Place 1 drop into both eyes 4 (four) times daily.   ferrous sulfate 325 (65 FE) MG tablet Take 1 tablet (325 mg total) by mouth daily.   furosemide 20 MG tablet Commonly known as:  LASIX Take 20 mg by mouth. Give  3 tablets by mouth one time a day for  CHF What changed:  Another medication with the same name was removed. Continue taking this medication, and follow the directions you see here.   gabapentin 600 MG tablet Commonly known as:  NEURONTIN Take 600 mg by mouth at bedtime.   insulin glargine 100 UNIT/ML injection Commonly known as:  LANTUS Inject 0.18 mLs (18 Units total) into the skin daily. Start taking on:  12/13/2017   levothyroxine 88 MCG tablet Commonly known as:  SYNTHROID, LEVOTHROID Take 88 mcg by mouth daily before breakfast.   LINZESS 290 MCG Caps capsule Generic drug:  linaclotide Take 290 mcg by mouth daily before breakfast.   neomycin-polymyxin-dexameth 0.1 % Oint Commonly known as:  MAXITROL Place 1 application into both eyes 3 (three) times daily. For dry eyes   NIFEdipine 30 MG 24 hr tablet Commonly  known as:  ADALAT CC Take 30 mg by mouth daily.   omeprazole 20 MG capsule Commonly known as:  PRILOSEC Take 20 mg by mouth 2 (two) times daily before a meal.   oxybutynin 15 MG 24 hr tablet Commonly known as:  DITROPAN XL Take 15 mg by mouth daily.   polyethylene glycol powder powder Commonly known as:  GLYCOLAX/MIRALAX Take 8.6 g by mouth daily.   pravastatin 40 MG tablet Commonly known as:  PRAVACHOL Take 40 mg by mouth at bedtime.   risperiDONE 0.25 MG tablet Commonly known as:  RISPERDAL Take 0.25 mg by mouth daily. What changed:  Another medication with the same name was removed. Continue taking this medication, and follow the directions you see here.   traZODone 50 MG tablet Commonly known as:  DESYREL Take 25 mg by mouth at bedtime.     Relevant Imaging Results:  Relevant Lab Results:   Additional Information    Shade Flood, LCSW

## 2017-12-12 NOTE — Progress Notes (Signed)
This RN spoke with the admissions nurse at [facility] who said that they are unable to accept patient back based on her current needs. Unable to contact CSW at this time. Patient made aware that she will be staying another night.  Celestia Khat, RN

## 2017-12-12 NOTE — Discharge Summary (Signed)
Physician Discharge Summary  Christine Hancock VZC:[id number] DOB: 04/08/1917 DOA: 12/05/2017  PCP: Sinda Du, MD  Admit date: 12/05/2017 Discharge date: 12/12/2017   Recommendations for Outpatient Follow-up:  Patient is a 84 year old black female who was admitted to the hospital altered mental status found to have nonketotic hyperosmolar state was found not to be septic blood cultures were essentially negative he was placed empirically on antibiotics upon admission these was subsequently withdrawn she remained hemodynamically stable throughout the hospital stay her glucoses were gotten under control with Humalog sliding scale was calculated that she should have long-acting insulin which was ratcheted up slowly given her advanced age for the last 4 days in hospital she was taking 18 units of Lantus at bedtime along with some mild sliding scale coverage she was in very good glycemic control alert and oriented and willing to learn to administer her own insulin diabetic teaching was provided she was felt to be hemodynamically stable and for the reason she was admitted to the hospital and subsequently discharged back to [facility] there was some recommendations for skilled nursing facility however family insisted should be returned to [facility] her those wishes were respected due to her significant improvement Discharge Diagnoses:  Principal Problem:   Non-ketotic hyperosmolar coma ([place]) Active Problems:   Dehydration   Failure to thrive in adult   Hypothyroidism   Acute lower UTI   AKI (acute kidney injury) ([place])   Chronic diastolic CHF (congestive heart failure) ([place])   Chronic kidney disease (CKD), stage III (moderate) (H[facility])   GERD (gastroesophageal reflux disease)   Hypothermia   DNR (do not resuscitate)   Discharge Condition: Good  Filed Weights   12/05/17 1250 12/06/17 0500 12/07/17 1287  Weight: 78.1 kg 78.7 kg 78.7 kg    History of present illness:   Patient 84 year old  black female was admitted hospital altered mental status found to have nonketotic hyperosmolar state found not to be septic on blood cultures initial antibiotics given empirically were then withdrawn she remained hemodynamically stable throughout the hospital stay and her glucose was brought under control with Humalog sliding scale she was then administered long-acting Lantus insulin titrated up to 18 units at bedtime with very good glycemic control achieved subsequently discharged back to [facility] after family wanted to return to their consideration was given to skilled nursing facility placement however the family and patient's wishes were respected Hospital Course:  See HPI above  Procedures:   Consultations:    Discharge Instructions  Discharge Instructions    Discharge instructions   Complete by:  As directed      Allergies as of 12/12/2017   No Known Allergies     Medication List    STOP taking these medications   acetaminophen 325 MG tablet Commonly known as:  TYLENOL   cephALEXin 500 MG capsule Commonly known as:  KEFLEX   dextromethorphan-guaiFENesin 10-100 MG/5ML liquid Commonly known as:  ROBITUSSIN-DM   ketoconazole 2 % shampoo Commonly known as:  NIZORAL   ketorolac 10 MG tablet Commonly known as:  TORADOL   rOPINIRole 0.5 MG tablet Commonly known as:  REQUIP   silver sulfADIAZINE 1 % cream Commonly known as:  SILVADENE   trolamine salicylate 10 % cream Commonly known as:  ASPERCREME     TAKE these medications   albuterol 108 (90 Base) MCG/ACT inhaler Commonly known as:  PROVENTIL HFA;VENTOLIN HFA Inhale 2 puffs into the lungs every 6 (six) hours as needed for wheezing or shortness of breath.  ANORO ELLIPTA 62.5-25 MCG/INH Aepb Generic drug:  umeclidinium-vilanterol Inhale 1 puff into the lungs daily.   apixaban 2.5 MG Tabs tablet Commonly known as:  ELIQUIS Take 1 tablet (2.5 mg total) by mouth 2 (two) times daily.   carboxymethylcellulose  0.5 % Soln Commonly known as:  REFRESH PLUS Place 1 drop into both eyes 4 (four) times daily.   ferrous sulfate 325 (65 FE) MG tablet Take 1 tablet (325 mg total) by mouth daily.   furosemide 20 MG tablet Commonly known as:  LASIX Take 20 mg by mouth. Give  3 tablets by mouth one time a day for  CHF What changed:  Another medication with the same name was removed. Continue taking this medication, and follow the directions you see here.   gabapentin 600 MG tablet Commonly known as:  NEURONTIN Take 600 mg by mouth at bedtime.   insulin glargine 100 UNIT/ML injection Commonly known as:  LANTUS Inject 0.18 mLs (18 Units total) into the skin daily. Start taking on:  12/13/2017   levothyroxine 88 MCG tablet Commonly known as:  SYNTHROID, LEVOTHROID Take 88 mcg by mouth daily before breakfast.   LINZESS 290 MCG Caps capsule Generic drug:  linaclotide Take 290 mcg by mouth daily before breakfast.   neomycin-polymyxin-dexameth 0.1 % Oint Commonly known as:  MAXITROL Place 1 application into both eyes 3 (three) times daily. For dry eyes   NIFEdipine 30 MG 24 hr tablet Commonly known as:  ADALAT CC Take 30 mg by mouth daily.   omeprazole 20 MG capsule Commonly known as:  PRILOSEC Take 20 mg by mouth 2 (two) times daily before a meal.   oxybutynin 15 MG 24 hr tablet Commonly known as:  DITROPAN XL Take 15 mg by mouth daily.   polyethylene glycol powder powder Commonly known as:  GLYCOLAX/MIRALAX Take 8.6 g by mouth daily.   pravastatin 40 MG tablet Commonly known as:  PRAVACHOL Take 40 mg by mouth at bedtime.   risperiDONE 0.25 MG tablet Commonly known as:  RISPERDAL Take 0.25 mg by mouth daily. What changed:  Another medication with the same name was removed. Continue taking this medication, and follow the directions you see here.   traZODone 50 MG tablet Commonly known as:  DESYREL Take 25 mg by mouth at bedtime.      No Known Allergies    The results of  significant diagnostics from this hospitalization (including imaging, microbiology, ancillary and laboratory) are listed below for reference.    Significant Diagnostic Studies: Ct Abdomen Pelvis Wo Contrast  Result Date: 12/05/2017 CLINICAL DATA:  Weakness, abdominal pain and malaise for the past 3 days. EXAM: CT ABDOMEN AND PELVIS WITHOUT CONTRAST TECHNIQUE: Multidetector CT imaging of the abdomen and pelvis was performed following the standard protocol without IV contrast. COMPARISON:  CXR 12/05/2017 FINDINGS: Lower chest: Top-normal size heart without pericardial effusion or thickening. Left main and three-vessel coronary arteriosclerosis is noted. There is thoracic aortic atherosclerosis. There are mitral valvular and annular calcifications. Pulmonary consolidation consistent with atelectasis and/or pneumonia in the posterior right lower lobe. Hepatobiliary: Cholecystectomy. The unenhanced liver is unremarkable. Pancreas: Atrophic without inflammation or mass. Spleen: Normal Adrenals/Urinary Tract: Normal bilateral adrenal glands. Simple bilateral renal cysts arising off both kidneys with homogeneous hyperdense 6 mm lesion off the interpolar right kidney likely to represent a small proteinaceous or hemorrhagic cyst. No worrisome features are noted. No nephrolithiasis nor hydroureteronephrosis. The urinary bladder is unremarkable for the degree of distention. Stomach/Bowel: Small hiatal hernia. Decompressed  stomach. The duodenal sweep and ligament of Treitz are unremarkable. Scattered colonic diverticulosis without acute diverticulitis. Large amount of retained stool throughout the colon consistent with constipation. Vascular/Lymphatic: Moderate aortoiliac and branch vessel atherosclerosis. No aortic aneurysm. No adenopathy. Reproductive: Status post hysterectomy. No adnexal masses. Other: No free air nor free fluid. Musculoskeletal: Multilevel degenerative facet arthropathy of the lumbar spine. No acute  osseous abnormality. IMPRESSION: 1. Pulmonary consolidation in the right lower lobe. Atelectasis versus pneumonia or combination thereof. 2. Increased fecal retention throughout the colon consistent with constipation with scattered colonic diverticulosis. No diverticulitis. No abscess. 3. Simple and complex renal cysts. 4. Coronary arteriosclerosis and aortic atherosclerosis. Electronically Signed   By: Ashley Royalty M.D.   On: 12/05/2017 16:32   Dg Chest 2 View  Result Date: 12/05/2017 CLINICAL DATA:  Altered mental status EXAM: CHEST - 2 VIEW COMPARISON:  09/30/2017 FINDINGS: Bibasilar atelectasis. Mild cardiomegaly. No visible effusions or overt edema. No acute bony abnormality. IMPRESSION: Cardiomegaly.  Bibasilar atelectasis. Electronically Signed   By: Rolm Baptise M.D.   On: 12/05/2017 10:06   Ct Head Wo Contrast  Result Date: 12/05/2017 CLINICAL DATA:  Altered level of consciousness. EXAM: CT HEAD WITHOUT CONTRAST TECHNIQUE: Contiguous axial images were obtained from the base of the skull through the vertex without intravenous contrast. COMPARISON:  08/24/2017 FINDINGS: Brain: There is atrophy and chronic small vessel disease changes. No acute intracranial abnormality. Specifically, no hemorrhage, hydrocephalus, mass lesion, acute infarction, or significant intracranial injury. Vascular: No hyperdense vessel or unexpected calcification. Skull: No acute calvarial abnormality. Sinuses/Orbits: Visualized paranasal sinuses and mastoids clear. Orbital soft tissues unremarkable. Other: None IMPRESSION: No acute intracranial abnormality. Atrophy, chronic microvascular disease. Electronically Signed   By: Rolm Baptise M.D.   On: 12/05/2017 09:55   Dg Chest Port 1 View  Result Date: 12/06/2017 CLINICAL DATA:  Hypothermia. EXAM: PORTABLE CHEST 1 VIEW COMPARISON:  Radiographs of December 05, 2017. FINDINGS: Stable cardiomegaly with central pulmonary vascular congestion. No pneumothorax is noted. Increased  bibasilar atelectasis or edema is noted with associated pleural effusions. Bony thorax is unremarkable. IMPRESSION: Stable cardiomegaly with central pulmonary vascular congestion. Increased bibasilar atelectasis or edema is noted with associated pleural effusions. Electronically Signed   By: Marijo Conception, M.D.   On: 12/06/2017 08:43    Microbiology: Recent Results (from the past 240 hour(s))  Urine culture     Status: Abnormal   Collection Time: 12/05/17  9:16 AM  Result Value Ref Range Status   Specimen Description   Final    URINE, CATHETERIZED Performed at [facility], [address], [place], [place] [address]    Special Requests   Final    NONE Performed at [facility], [address], [place], [place] [address]    Culture MULTIPLE SPECIES PRESENT, SUGGEST RECOLLECTION (A)  Final   Report Status 12/06/2017 FINAL  Final  Culture, blood (routine x 2)     Status: None   Collection Time: 12/05/17  9:22 AM  Result Value Ref Range Status   Specimen Description BLOOD RIGHT ANTECUBITAL  Final   Special Requests   Final    BOTTLES DRAWN AEROBIC AND ANAEROBIC Blood Culture adequate volume   Culture   Final    NO GROWTH 5 DAYS Performed at [facility], [address], [place], [place] [address]    Report Status 12/10/2017 FINAL  Final  Culture, blood (routine x 2)     Status: None   Collection Time: 12/05/17 10:14 AM  Result Value  Ref Range Status   Specimen Description BLOOD BLOOD RIGHT FOREARM  Final   Special Requests   Final    BOTTLES DRAWN AEROBIC ONLY Blood Culture results may not be optimal due to an inadequate volume of blood received in culture bottles   Culture   Final    NO GROWTH 5 DAYS Performed at [facility], [address] [place], [place] [address]    Report Status 12/10/2017 FINAL  Final  MRSA PCR Screening     Status: None   Collection Time: 12/05/17  5:00 PM  Result Value Ref Range Status   MRSA by PCR NEGATIVE NEGATIVE Final    Comment:        The  GeneXpert MRSA Assay (FDA approved for NASAL specimens only), is one component of a comprehensive MRSA colonization surveillance program. It is not intended to diagnose MRSA infection nor to guide or monitor treatment for MRSA infections. Performed at [facility], [address], [place], [place] [address]      Labs: Basic Metabolic Panel: Recent Labs  Lab 12/06/17 [phone] 12/07/17 0402 12/08/17 0453 12/09/17 0448 12/10/17 0643 12/11/17 0619 12/12/17 0554  NA 136 133* 133* 135 136 134* 135  K 4.1 4.1 4.3 4.2 4.1 4.0 4.5  CL 106 104 105 107 105 105 105  CO2 23 23 22 23 24 25 23   GLUCOSE 150* 212* 269* 136* 161* 124* 151*  BUN 26* 17 18 16 17 16 16   CREATININE 1.33* 1.13* 1.20* 1.17* 1.12* 0.99 1.13*  CALCIUM 8.4* 9.1 9.3 9.1 8.9 8.8* 9.3  MG 2.4 2.0  --   --   --   --   --   PHOS  --  3.1  --   --   --   --   --    Liver Function Tests: No results for input(s): AST, ALT, ALKPHOS, BILITOT, PROT, ALBUMIN in the last 168 hours. No results for input(s): LIPASE, AMYLASE in the last 168 hours. No results for input(s): AMMONIA in the last 168 hours. CBC: Recent Labs  Lab 12/06/17 0339 12/07/17 0402 12/08/17 0453 12/09/17 0448  WBC 6.0 4.9 5.9 5.0  NEUTROABS 3.7 2.8 3.9 2.5  HGB 10.1* 10.2* 10.7* 10.3*  HCT 31.7* 32.9* 34.2* 32.6*  MCV 87.6 86.1 87.0 88.6  PLT 210 211 216 226   Cardiac Enzymes: No results for input(s): CKTOTAL, CKMB, CKMBINDEX, TROPONINI in the last 168 hours. BNP: BNP (last 3 results) Recent Labs    04/14/17 1349 09/30/17 1546  BNP 63.0 113.0*    ProBNP (last 3 results) No results for input(s): PROBNP in the last 8760 hours.  CBG: Recent Labs  Lab 12/11/17 1112 12/11/17 1621 12/11/17 2103 12/12/17 0800 12/12/17 1124  GLUCAP 225* 176* 162* 136* 276*       Signed:  Dannette Kinkaid M   Pager: [phone] 12/12/2017, 1:00 PM

## 2017-12-12 NOTE — Care Management Note (Signed)
Case Management Note  Patient Details  Name: Christine Hancock MRN: [id number] Date of Birth: 14-Jan-1918  Subjective/Objective:                    Action/Plan: Patient discharging back to [place]. Will need HH PT. Has has [facility] in the past. Referral made to [facility], orders will be retrieved via Epic.   Expected Discharge Date:  12/12/17               Expected Discharge Plan:  [place]  In-House Referral:  Clinical Social Work  Discharge planning Services  CM Consult  Post Acute Care Choice:  Home Health Choice offered to:  [facility] POA / Guardian  DME Arranged:    DME Agency:     HH Arranged:    [place] Agency:     Status of Service:  Completed, signed off  If discussed at [employer], dates discussed:    Additional Comments:  Kyndel Egger, Chauncey [place], RN 12/12/2017, 2:33 PM

## 2017-12-13 DIAGNOSIS — K210 Gastro-esophageal reflux disease with esophagitis: Secondary | ICD-10-CM | POA: Diagnosis not present

## 2017-12-13 DIAGNOSIS — R627 Adult failure to thrive: Secondary | ICD-10-CM | POA: Diagnosis not present

## 2017-12-13 DIAGNOSIS — I129 Hypertensive chronic kidney disease with stage 1 through stage 4 chronic kidney disease, or unspecified chronic kidney disease: Secondary | ICD-10-CM | POA: Diagnosis not present

## 2017-12-13 DIAGNOSIS — E1101 Type 2 diabetes mellitus with hyperosmolarity with coma: Secondary | ICD-10-CM | POA: Diagnosis not present

## 2017-12-13 DIAGNOSIS — R41841 Cognitive communication deficit: Secondary | ICD-10-CM | POA: Diagnosis not present

## 2017-12-13 DIAGNOSIS — B961 Klebsiella pneumoniae [K. pneumoniae] as the cause of diseases classified elsewhere: Secondary | ICD-10-CM | POA: Diagnosis not present

## 2017-12-13 DIAGNOSIS — Z794 Long term (current) use of insulin: Secondary | ICD-10-CM | POA: Diagnosis not present

## 2017-12-13 DIAGNOSIS — I82599 Chronic embolism and thrombosis of other specified deep vein of unspecified lower extremity: Secondary | ICD-10-CM | POA: Diagnosis not present

## 2017-12-13 DIAGNOSIS — R1312 Dysphagia, oropharyngeal phase: Secondary | ICD-10-CM | POA: Diagnosis not present

## 2017-12-13 DIAGNOSIS — Z7901 Long term (current) use of anticoagulants: Secondary | ICD-10-CM | POA: Diagnosis not present

## 2017-12-13 DIAGNOSIS — N390 Urinary tract infection, site not specified: Secondary | ICD-10-CM | POA: Diagnosis not present

## 2017-12-13 DIAGNOSIS — I5022 Chronic systolic (congestive) heart failure: Secondary | ICD-10-CM | POA: Diagnosis not present

## 2017-12-13 DIAGNOSIS — M6281 Muscle weakness (generalized): Secondary | ICD-10-CM | POA: Diagnosis not present

## 2017-12-13 DIAGNOSIS — F419 Anxiety disorder, unspecified: Secondary | ICD-10-CM | POA: Diagnosis not present

## 2017-12-13 DIAGNOSIS — E1122 Type 2 diabetes mellitus with diabetic chronic kidney disease: Secondary | ICD-10-CM | POA: Diagnosis not present

## 2017-12-13 DIAGNOSIS — I130 Hypertensive heart and chronic kidney disease with heart failure and stage 1 through stage 4 chronic kidney disease, or unspecified chronic kidney disease: Secondary | ICD-10-CM | POA: Diagnosis not present

## 2017-12-13 DIAGNOSIS — Z86718 Personal history of other venous thrombosis and embolism: Secondary | ICD-10-CM | POA: Diagnosis not present

## 2017-12-13 DIAGNOSIS — I4891 Unspecified atrial fibrillation: Secondary | ICD-10-CM | POA: Diagnosis not present

## 2017-12-13 DIAGNOSIS — R5381 Other malaise: Secondary | ICD-10-CM | POA: Diagnosis not present

## 2017-12-13 DIAGNOSIS — M25532 Pain in left wrist: Secondary | ICD-10-CM | POA: Diagnosis not present

## 2017-12-13 DIAGNOSIS — E860 Dehydration: Secondary | ICD-10-CM | POA: Diagnosis not present

## 2017-12-13 DIAGNOSIS — N183 Chronic kidney disease, stage 3 (moderate): Secondary | ICD-10-CM | POA: Diagnosis not present

## 2017-12-13 DIAGNOSIS — Z9981 Dependence on supplemental oxygen: Secondary | ICD-10-CM | POA: Diagnosis not present

## 2017-12-13 DIAGNOSIS — E1165 Type 2 diabetes mellitus with hyperglycemia: Secondary | ICD-10-CM | POA: Diagnosis not present

## 2017-12-13 DIAGNOSIS — E1140 Type 2 diabetes mellitus with diabetic neuropathy, unspecified: Secondary | ICD-10-CM | POA: Diagnosis not present

## 2017-12-13 DIAGNOSIS — M79641 Pain in right hand: Secondary | ICD-10-CM | POA: Diagnosis not present

## 2017-12-13 DIAGNOSIS — Z87440 Personal history of urinary (tract) infections: Secondary | ICD-10-CM | POA: Diagnosis not present

## 2017-12-13 DIAGNOSIS — I5032 Chronic diastolic (congestive) heart failure: Secondary | ICD-10-CM | POA: Diagnosis not present

## 2017-12-13 DIAGNOSIS — J449 Chronic obstructive pulmonary disease, unspecified: Secondary | ICD-10-CM | POA: Diagnosis not present

## 2017-12-13 DIAGNOSIS — E038 Other specified hypothyroidism: Secondary | ICD-10-CM | POA: Diagnosis not present

## 2017-12-13 DIAGNOSIS — Z7401 Bed confinement status: Secondary | ICD-10-CM | POA: Diagnosis not present

## 2017-12-13 DIAGNOSIS — R4020 Unspecified coma: Secondary | ICD-10-CM | POA: Diagnosis not present

## 2017-12-13 LAB — BASIC METABOLIC PANEL
ANION GAP: 8 (ref 5–15)
BUN: 18 mg/dL (ref 8–23)
CALCIUM: 9.2 mg/dL (ref 8.9–10.3)
CO2: 21 mmol/L — AB (ref 22–32)
CREATININE: 1.04 mg/dL — AB (ref 0.44–1.00)
Chloride: 105 mmol/L (ref 98–111)
GFR, EST AFRICAN AMERICAN: 50 mL/min — AB (ref >60)
GFR, EST NON AFRICAN AMERICAN: 43 mL/min — AB (ref >60)
GLUCOSE: 107 mg/dL — AB (ref 70–99)
Potassium: 4.3 mmol/L (ref 3.5–5.1)
Sodium: 134 mmol/L — ABNORMAL LOW (ref 135–145)

## 2017-12-13 LAB — GLUCOSE, CAPILLARY
GLUCOSE-CAPILLARY: 95 mg/dL (ref 70–99)
GLUCOSE-CAPILLARY: 236 mg/dL — AB (ref 70–99)

## 2017-12-13 NOTE — Progress Notes (Signed)
Report called to [facility] at [facility].

## 2017-12-13 NOTE — Clinical Social Work Note (Signed)
Pt to be discharged to [facility] today. Pt's niece updated by phone. Updated pt's RN. He will call report to [facility]. Their number is [phone]. EMS transport arranged for 2pm. DC clinical sent through the Hub. There are no other CSW needs for dc.

## 2017-12-13 NOTE — Progress Notes (Signed)
Inpatient Diabetes Program Recommendations  AACE/ADA: New Consensus Statement on Inpatient Glycemic Control (2019)  Target Ranges:  Prepandial:   less than 140 mg/dL      Peak postprandial:   less than 180 mg/dL (1-2 hours)      Critically ill patients:  140 - 180 mg/dL   Results for JAZYIAH, YIU (MRN [id number]) as of 12/13/2017 11:56  Ref. Range 12/12/2017 08:00 12/12/2017 11:24 12/12/2017 16:20 12/12/2017 21:20 12/13/2017 07:31 12/13/2017 11:13  Glucose-Capillary Latest Ref Range: 70 - 99 mg/dL 136 (H) 276 (H) 169 (H) 233 (H) 95 236 (H)   Review of Glycemic Control  Diabetes history: NO Outpatient Diabetes medications: NA Current orders for Inpatient glycemic control: Lantus 18 units daily, Novolog 0-15 units TID with meals, Novolog 0-5 units QHS  Inpatient Diabetes Program Recommendations:  Glucose increases after meals, please consider ordering Novolog 3 units TID with meals for meal coverage if patient eats at least 50% of meals.  Thanks, Tama Headings RN, MSN, BC-ADM Inpatient Diabetes Coordinator Team Pager [phone] (8a-5p)

## 2017-12-13 NOTE — NC FL2 (Signed)
[place] LEVEL OF CARE SCREENING TOOL     IDENTIFICATION  Patient Name: Christine Hancock Birthdate: Sep 16, 1917 Sex: female Admission Date (Current Location): 12/05/2017  [facility] and [place] Number:  [employer] and Address:  [place] [address], [place]      Provider Number: [id number]  Attending Physician Name and Address:  Isaac Bliss, [place]  Relative Name and Phone Number:  [name] (niece) [phone]    Current Level of Care: Hospital Recommended Level of Care: [place] Prior Approval Number:    Date Approved/Denied:   PASRR Number:    Discharge Plan: Other (Comment)    Current Diagnoses: Patient Active Problem List   Diagnosis Date Noted  . Non-ketotic hyperosmolar coma ([place]) 12/05/2017  . GERD (gastroesophageal reflux disease) 12/05/2017  . Hypothermia 12/05/2017  . DNR (do not resuscitate) 12/05/2017  . Anemia due to chronic blood loss 08/28/2017  . Iron deficiency anemia due to chronic blood loss   . GI bleed 08/25/2017  . GIB (gastrointestinal bleeding) 08/24/2017  . Chronic diastolic CHF (congestive heart failure) ([place]) 08/24/2017  . Chronic kidney disease (CKD), stage III (moderate) ([place]) 08/24/2017  . Symptomatic anemia   . Acute on chronic diastolic heart failure ([place]) 10/30/2016  . Acute lower UTI 10/27/2016  . AKI (acute kidney injury) ([place]) 10/27/2016  . Dehydration 10/24/2011  . Acute on chronic renal failure ([place]) 10/24/2011  . Failure to thrive in adult 10/24/2011  . Microcytic anemia 10/24/2011  . Anxiety 10/24/2011  . Hypothyroidism 10/24/2011    Orientation RESPIRATION BLADDER Height & Weight     Self, Place  O2(see dc summary) Incontinent Weight: 173 lb 8 oz (78.7 kg) Height:  5\' 2"  (157.5 cm)  BEHAVIORAL SYMPTOMS/MOOD NEUROLOGICAL BOWEL NUTRITION STATUS      Incontinent Diet(see dc summary)  AMBULATORY STATUS COMMUNICATION OF NEEDS Skin   Extensive  Assist Verbally Normal                       Personal Care Assistance Level of Assistance  Bathing, Feeding, Dressing Bathing Assistance: Maximum assistance Feeding assistance: Independent Dressing Assistance: Maximum assistance     Functional Limitations Info  Sight, Hearing, Speech Sight Info: Adequate Hearing Info: Adequate Speech Info: Adequate    SPECIAL CARE FACTORS FREQUENCY  PT (By licensed PT)     PT Frequency: 5 times week              Contractures Contractures Info: Not present    Additional Factors Info  Code Status, Allergies, Psychotropic Code Status Info: DNR Allergies Info: NKA Psychotropic Info: Risperdal         Current Medications (12/13/2017):  This is the current hospital active medication list Current Facility-Administered Medications  Medication Dose Route Frequency Provider Last Rate Last Dose  . 0.9 %  sodium chloride infusion   Intravenous PRN Sinda Du, MD 10 mL/hr at 12/09/17 0400    . acetaminophen (TYLENOL) tablet 650 mg  650 mg Oral Q6H PRN Sinda Du, MD       Or  . acetaminophen (TYLENOL) suppository 650 mg  650 mg Rectal Q6H PRN Sinda Du, MD      . apixaban Arne [place]) tablet 2.5 mg  2.5 mg Oral BID Sinda Du, MD   2.5 mg at 12/13/17 0817  . famotidine (PEPCID) IVPB 20 mg premix  20 mg Intravenous Q24H Sinda Du, MD 100 mL/hr at 12/12/17 1251 20 mg at 12/12/17  1251  . ferrous sulfate tablet 325 mg  325 mg Oral Daily Sinda Du, MD   325 mg at 12/13/17 0817  . gabapentin (NEURONTIN) capsule 600 mg  600 mg Oral Elana Alm, MD   600 mg at 12/12/17 2151  . insulin aspart (novoLOG) injection 0-15 Units  0-15 Units Subcutaneous TID WC Sinda Du, MD   3 Units at 12/12/17 1656  . insulin aspart (novoLOG) injection 0-5 Units  0-5 Units Subcutaneous QHS Sinda Du, MD   2 Units at 12/12/17 2152  . insulin glargine (LANTUS) injection 18 Units  18 Units Subcutaneous Daily Sinda Du, MD   18 Units at 12/13/17 [phone]  . iopamidol (ISOVUE-300) 61 % injection 30 mL  30 mL Oral Once PRN Sinda Du, MD      . levothyroxine (SYNTHROID, LEVOTHROID) tablet 88 mcg  88 mcg Oral QAC breakfast Sinda Du, MD   88 mcg at 12/13/17 0817  . linaclotide (LINZESS) capsule 290 mcg  290 mcg Oral QAC breakfast Sinda Du, MD   290 mcg at 12/13/17 [phone]  . NIFEdipine (PROCARDIA-XL/NIFEDICAL-XL) 24 hr tablet 30 mg  30 mg Oral Daily Sinda Du, MD   30 mg at 12/13/17 0817  . ondansetron (ZOFRAN) tablet 4 mg  4 mg Oral Q6H PRN Sinda Du, MD       Or  . ondansetron [facility]) injection 4 mg  4 mg Intravenous Q6H PRN Sinda Du, MD      . oxybutynin (DITROPAN-XL) 24 hr tablet 15 mg  15 mg Oral Daily Sinda Du, MD   15 mg at 12/13/17 0818  . polyethylene glycol (MIRALAX / GLYCOLAX) packet 17 g  17 g Oral Daily PRN Sinda Du, MD      . polyethylene glycol (MIRALAX / GLYCOLAX) packet 17 g  17 g Oral Daily Sinda Du, MD   17 g at 12/12/17 0817  . polyvinyl alcohol (LIQUIFILM TEARS) 1.4 % ophthalmic solution 1 drop  1 drop Both Eyes PRN Sinda Du, MD      . pravastatin (PRAVACHOL) tablet 40 mg  40 mg Oral q1800 Sinda Du, MD   40 mg at 12/12/17 1656  . risperiDONE (RISPERDAL) tablet 0.25 mg  0.25 mg Oral Daily Sinda Du, MD   0.25 mg at 12/13/17 0817  . risperiDONE (RISPERDAL) tablet 0.5 mg  0.5 mg Oral QHS Sinda Du, MD   0.5 mg at 12/12/17 2151  . rOPINIRole (REQUIP) tablet 0.5 mg  0.5 mg Oral Elana Alm, MD   0.5 mg at 12/12/17 2149  . traZODone (DESYREL) tablet 25 mg  25 mg Oral Elana Alm, MD   25 mg at 12/12/17 2150  . umeclidinium-vilanterol (ANORO ELLIPTA) 62.5-25 MCG/INH 1 puff  1 puff Inhalation Daily Sinda Du, MD   1 puff at 12/13/17 0804     Discharge Medications: Please see discharge summary for a list of discharge medications.  Relevant Imaging Results:  Relevant Lab  Results:   Additional Information SSN: [id number], [place]

## 2017-12-13 NOTE — Clinical Social Work Note (Signed)
LCSW following. After ALF reviewed pt's records yesterday, they felt that pt should do SNF rehab for a short term. Spoke with pt's niece, Ivin Booty, today by phone to update. Ivin Booty requested referral to [facility] which was made and pt is accepted. Working on [employer]. Pt can dc today once PASRR is obtained. Updated MD.   Will follow and continue to assist with dc plans.

## 2017-12-13 NOTE — Discharge Summary (Signed)
To be discharged to SNF today. Plan yesterday was to return to ALF but they were unable to take her back. Will need oxygen 2 L via [place] continuously. OK for DC to SNF today. Please see DC Summary by Dr. Cindie Laroche on 10/21 for further details.  Domingo Mend, MD [facility] Pager: [phone]

## 2017-12-13 NOTE — Progress Notes (Signed)
Physical Therapy Treatment Patient Details Name: Christine Hancock MRN: [id number] DOB: 1917-12-23 Today's Date: 12/13/2017    History of Present Illness Christine Hancock is a 84 y.o. female presenting for evaluation of weakness.  Pt also having some abdominal pain.  For the past 3 days patient has not been feeling well.    Patient states she has been having difficulty with her words.  Pt can't remember when she had her last bowel movement.  Her symptoms have been present for the past 3 days.   She denies known fevers, cough.  Per facility paperwork, patient has been treated for the last 3 days for a UTI with Keflex 500 mg 3 times daily.  She is not on any prednisone or steroids.  Patient is on Eliquis, she denies fall, trauma, or injury.  Per EMS, patient CBG was read as high.  Patient does not have a history of diabetes.   Additional history obtained from chart review, patient with a history of recent GI bleed, CKD, hypotension, hypothyroidism, CHF.  Is on O2 at baseline, 2 L.  She has not needed to raise her O2 recently. Pt in wheelchair at baseline.  Per chart review, pt was DNR at last hospitalization.  Discussed with staff member at [facility] where patient is a resident.  Per staff member, patient has been confused and slightly altered for the past 3 days.  Today they noticed that her speech was more slurred.  They do not know when this began, as the person was talking to was not present over the weekend.  Patient has been afebrile and not hypothermic.  Staff member confirms that patient does not have a history of diabetes.  I also spoke with niece who confirms that patient does not have a history of diabetes mellitus.   She was noted in ED to have a blood sugar greater than 600.  She was also noted to be hypothermic and was started on broad spectrum antibiotics, warmed IV fluids and a bear hugger warming device.  She is being admitted to SDU for hyperosmolar nonketotic syndrome for IV insulin infusion.      PT Comments     Ptwas seen for updating of mobility but would not get OOB today.  Her plan is to dc to SNF for rehab and will be going today to continue with standing and balance work toward better gait.  Follow acutely as tolerated for strengthening and control of posture, to sit OOB in chair and work on extended standing as able.   Follow Up Recommendations  SNF     Equipment Recommendations  None recommended by PT    Recommendations for Other Services       Precautions / Restrictions Precautions Precautions: Fall Restrictions Weight Bearing Restrictions: No    Mobility  Bed Mobility Overal bed mobility: Needs Assistance Bed Mobility: Rolling Rolling: Mod assist;Min assist         General bed mobility comments: more fluid leg movements but still effortful  Transfers                 General transfer comment: declined to attempt  Ambulation/Gait                 Stairs             Wheelchair Mobility    Modified Rankin (Stroke Patients Only)       Balance Overall balance assessment: History of Falls;Needs assistance Sitting-balance support: Feet supported;Bilateral upper extremity supported  Cognition Arousal/Alertness: Awake/alert Behavior During Therapy: Impulsive;WFL for tasks assessed/performed Overall Cognitive Status: History of cognitive impairments - at baseline                                 General Comments: Pt was unable to tolerate any standing and was not able to follow instructions without both tactile and verbal cues      Exercises General Exercises - Lower Extremity Ankle Circles/Pumps: AAROM;Both;5 reps Quad Sets: AROM;Both;5 reps Gluteal Sets: AROM;Both;5 reps Heel Slides: AAROM;Both;10 reps Hip ABduction/ADduction: AAROM;Both;10 reps Straight Leg Raises: AAROM;Both;10 reps Hip Flexion/Marching: AROM;Both;10 reps    General Comments  General comments (skin integrity, edema, etc.): pt was able to both do the leg exercises and read a card with get well messages      Pertinent Vitals/Pain Pain Assessment: No/denies pain    Home Living                      Prior Function            PT Goals (current goals can now be found in the care plan section) Progress towards PT goals: Progressing toward goals    Frequency    Min 3X/week      PT Plan Current plan remains appropriate    Co-evaluation              AM-PAC PT "6 Clicks" Daily Activity  Outcome Measure  Difficulty turning over in bed (including adjusting bedclothes, sheets and blankets)?: Unable Difficulty moving from lying on back to sitting on the side of the bed? : Unable Difficulty sitting down on and standing up from a chair with arms (e.g., wheelchair, bedside commode, etc,.)?: Unable Help needed moving to and from a bed to chair (including a wheelchair)?: A Lot Help needed walking in hospital room?: A Lot Help needed climbing 3-5 steps with a railing? : Total 6 Click Score: 8    End of Session   Activity Tolerance: Patient limited by fatigue;Patient tolerated treatment well Patient left: in bed;with call bell/phone within reach;with bed alarm set Nurse Communication: Mobility status PT Visit Diagnosis: Unsteadiness on feet (R26.81);Other abnormalities of gait and mobility (R26.89);Muscle weakness (generalized) (M62.81)     Time: 2703-5009 PT Time Calculation (min) (ACUTE ONLY): 13 min  Charges:  $Therapeutic Exercise: 8-22 mins                    Ramond Dial 12/13/2017, 3:02 PM   3:04 PM, 12/13/17 Mee Hives, PT, MS Physical Therapist - [place] [phone] (Office)

## 2018-01-19 DIAGNOSIS — E038 Other specified hypothyroidism: Secondary | ICD-10-CM | POA: Diagnosis not present

## 2018-01-19 DIAGNOSIS — N183 Chronic kidney disease, stage 3 (moderate): Secondary | ICD-10-CM | POA: Diagnosis not present

## 2018-01-19 DIAGNOSIS — I5022 Chronic systolic (congestive) heart failure: Secondary | ICD-10-CM | POA: Diagnosis not present

## 2018-01-19 DIAGNOSIS — K210 Gastro-esophageal reflux disease with esophagitis: Secondary | ICD-10-CM | POA: Diagnosis not present

## 2018-02-02 DIAGNOSIS — E1122 Type 2 diabetes mellitus with diabetic chronic kidney disease: Secondary | ICD-10-CM | POA: Diagnosis not present

## 2018-02-02 DIAGNOSIS — I5032 Chronic diastolic (congestive) heart failure: Secondary | ICD-10-CM | POA: Diagnosis not present

## 2018-02-02 DIAGNOSIS — N183 Chronic kidney disease, stage 3 (moderate): Secondary | ICD-10-CM | POA: Diagnosis not present

## 2018-02-02 DIAGNOSIS — I130 Hypertensive heart and chronic kidney disease with heart failure and stage 1 through stage 4 chronic kidney disease, or unspecified chronic kidney disease: Secondary | ICD-10-CM | POA: Diagnosis not present

## 2018-02-02 DIAGNOSIS — J449 Chronic obstructive pulmonary disease, unspecified: Secondary | ICD-10-CM | POA: Diagnosis not present

## 2018-02-02 DIAGNOSIS — E1165 Type 2 diabetes mellitus with hyperglycemia: Secondary | ICD-10-CM | POA: Diagnosis not present

## 2018-02-03 DIAGNOSIS — I130 Hypertensive heart and chronic kidney disease with heart failure and stage 1 through stage 4 chronic kidney disease, or unspecified chronic kidney disease: Secondary | ICD-10-CM | POA: Diagnosis not present

## 2018-02-03 DIAGNOSIS — E1165 Type 2 diabetes mellitus with hyperglycemia: Secondary | ICD-10-CM | POA: Diagnosis not present

## 2018-02-03 DIAGNOSIS — E1122 Type 2 diabetes mellitus with diabetic chronic kidney disease: Secondary | ICD-10-CM | POA: Diagnosis not present

## 2018-02-03 DIAGNOSIS — J449 Chronic obstructive pulmonary disease, unspecified: Secondary | ICD-10-CM | POA: Diagnosis not present

## 2018-02-03 DIAGNOSIS — I5032 Chronic diastolic (congestive) heart failure: Secondary | ICD-10-CM | POA: Diagnosis not present

## 2018-02-03 DIAGNOSIS — N183 Chronic kidney disease, stage 3 (moderate): Secondary | ICD-10-CM | POA: Diagnosis not present

## 2018-02-06 DIAGNOSIS — Z6831 Body mass index (BMI) 31.0-31.9, adult: Secondary | ICD-10-CM | POA: Diagnosis not present

## 2018-02-06 DIAGNOSIS — J449 Chronic obstructive pulmonary disease, unspecified: Secondary | ICD-10-CM | POA: Diagnosis not present

## 2018-02-06 DIAGNOSIS — I509 Heart failure, unspecified: Secondary | ICD-10-CM | POA: Diagnosis not present

## 2018-02-06 DIAGNOSIS — E1165 Type 2 diabetes mellitus with hyperglycemia: Secondary | ICD-10-CM | POA: Diagnosis not present

## 2018-02-06 DIAGNOSIS — Z789 Other specified health status: Secondary | ICD-10-CM | POA: Diagnosis not present

## 2018-02-06 DIAGNOSIS — I5032 Chronic diastolic (congestive) heart failure: Secondary | ICD-10-CM | POA: Diagnosis not present

## 2018-02-06 DIAGNOSIS — I130 Hypertensive heart and chronic kidney disease with heart failure and stage 1 through stage 4 chronic kidney disease, or unspecified chronic kidney disease: Secondary | ICD-10-CM | POA: Diagnosis not present

## 2018-02-06 DIAGNOSIS — Z299 Encounter for prophylactic measures, unspecified: Secondary | ICD-10-CM | POA: Diagnosis not present

## 2018-02-06 DIAGNOSIS — N183 Chronic kidney disease, stage 3 (moderate): Secondary | ICD-10-CM | POA: Diagnosis not present

## 2018-02-06 DIAGNOSIS — J069 Acute upper respiratory infection, unspecified: Secondary | ICD-10-CM | POA: Diagnosis not present

## 2018-02-06 DIAGNOSIS — E1122 Type 2 diabetes mellitus with diabetic chronic kidney disease: Secondary | ICD-10-CM | POA: Diagnosis not present

## 2018-02-06 DIAGNOSIS — I10 Essential (primary) hypertension: Secondary | ICD-10-CM | POA: Diagnosis not present

## 2018-02-07 DIAGNOSIS — I5032 Chronic diastolic (congestive) heart failure: Secondary | ICD-10-CM | POA: Diagnosis not present

## 2018-02-07 DIAGNOSIS — E1165 Type 2 diabetes mellitus with hyperglycemia: Secondary | ICD-10-CM | POA: Diagnosis not present

## 2018-02-07 DIAGNOSIS — E1122 Type 2 diabetes mellitus with diabetic chronic kidney disease: Secondary | ICD-10-CM | POA: Diagnosis not present

## 2018-02-07 DIAGNOSIS — N183 Chronic kidney disease, stage 3 (moderate): Secondary | ICD-10-CM | POA: Diagnosis not present

## 2018-02-07 DIAGNOSIS — I130 Hypertensive heart and chronic kidney disease with heart failure and stage 1 through stage 4 chronic kidney disease, or unspecified chronic kidney disease: Secondary | ICD-10-CM | POA: Diagnosis not present

## 2018-02-07 DIAGNOSIS — J449 Chronic obstructive pulmonary disease, unspecified: Secondary | ICD-10-CM | POA: Diagnosis not present

## 2018-02-09 DIAGNOSIS — J449 Chronic obstructive pulmonary disease, unspecified: Secondary | ICD-10-CM | POA: Diagnosis not present

## 2018-02-09 DIAGNOSIS — I5032 Chronic diastolic (congestive) heart failure: Secondary | ICD-10-CM | POA: Diagnosis not present

## 2018-02-09 DIAGNOSIS — E1122 Type 2 diabetes mellitus with diabetic chronic kidney disease: Secondary | ICD-10-CM | POA: Diagnosis not present

## 2018-02-09 DIAGNOSIS — E1165 Type 2 diabetes mellitus with hyperglycemia: Secondary | ICD-10-CM | POA: Diagnosis not present

## 2018-02-09 DIAGNOSIS — N183 Chronic kidney disease, stage 3 (moderate): Secondary | ICD-10-CM | POA: Diagnosis not present

## 2018-02-09 DIAGNOSIS — I130 Hypertensive heart and chronic kidney disease with heart failure and stage 1 through stage 4 chronic kidney disease, or unspecified chronic kidney disease: Secondary | ICD-10-CM | POA: Diagnosis not present

## 2018-02-13 DIAGNOSIS — E1122 Type 2 diabetes mellitus with diabetic chronic kidney disease: Secondary | ICD-10-CM | POA: Diagnosis not present

## 2018-02-13 DIAGNOSIS — I5032 Chronic diastolic (congestive) heart failure: Secondary | ICD-10-CM | POA: Diagnosis not present

## 2018-02-13 DIAGNOSIS — I130 Hypertensive heart and chronic kidney disease with heart failure and stage 1 through stage 4 chronic kidney disease, or unspecified chronic kidney disease: Secondary | ICD-10-CM | POA: Diagnosis not present

## 2018-02-13 DIAGNOSIS — E1165 Type 2 diabetes mellitus with hyperglycemia: Secondary | ICD-10-CM | POA: Diagnosis not present

## 2018-02-13 DIAGNOSIS — J449 Chronic obstructive pulmonary disease, unspecified: Secondary | ICD-10-CM | POA: Diagnosis not present

## 2018-02-13 DIAGNOSIS — N183 Chronic kidney disease, stage 3 (moderate): Secondary | ICD-10-CM | POA: Diagnosis not present

## 2018-02-14 DIAGNOSIS — E1122 Type 2 diabetes mellitus with diabetic chronic kidney disease: Secondary | ICD-10-CM | POA: Diagnosis not present

## 2018-02-14 DIAGNOSIS — N183 Chronic kidney disease, stage 3 (moderate): Secondary | ICD-10-CM | POA: Diagnosis not present

## 2018-02-14 DIAGNOSIS — I5032 Chronic diastolic (congestive) heart failure: Secondary | ICD-10-CM | POA: Diagnosis not present

## 2018-02-14 DIAGNOSIS — E1165 Type 2 diabetes mellitus with hyperglycemia: Secondary | ICD-10-CM | POA: Diagnosis not present

## 2018-02-14 DIAGNOSIS — J449 Chronic obstructive pulmonary disease, unspecified: Secondary | ICD-10-CM | POA: Diagnosis not present

## 2018-02-14 DIAGNOSIS — I130 Hypertensive heart and chronic kidney disease with heart failure and stage 1 through stage 4 chronic kidney disease, or unspecified chronic kidney disease: Secondary | ICD-10-CM | POA: Diagnosis not present

## 2018-02-16 DIAGNOSIS — E1165 Type 2 diabetes mellitus with hyperglycemia: Secondary | ICD-10-CM | POA: Diagnosis not present

## 2018-02-16 DIAGNOSIS — J449 Chronic obstructive pulmonary disease, unspecified: Secondary | ICD-10-CM | POA: Diagnosis not present

## 2018-02-16 DIAGNOSIS — I5032 Chronic diastolic (congestive) heart failure: Secondary | ICD-10-CM | POA: Diagnosis not present

## 2018-02-16 DIAGNOSIS — N183 Chronic kidney disease, stage 3 (moderate): Secondary | ICD-10-CM | POA: Diagnosis not present

## 2018-02-16 DIAGNOSIS — I130 Hypertensive heart and chronic kidney disease with heart failure and stage 1 through stage 4 chronic kidney disease, or unspecified chronic kidney disease: Secondary | ICD-10-CM | POA: Diagnosis not present

## 2018-02-16 DIAGNOSIS — E1122 Type 2 diabetes mellitus with diabetic chronic kidney disease: Secondary | ICD-10-CM | POA: Diagnosis not present

## 2018-02-17 DIAGNOSIS — N183 Chronic kidney disease, stage 3 (moderate): Secondary | ICD-10-CM | POA: Diagnosis not present

## 2018-02-17 DIAGNOSIS — I5032 Chronic diastolic (congestive) heart failure: Secondary | ICD-10-CM | POA: Diagnosis not present

## 2018-02-17 DIAGNOSIS — E1122 Type 2 diabetes mellitus with diabetic chronic kidney disease: Secondary | ICD-10-CM | POA: Diagnosis not present

## 2018-02-17 DIAGNOSIS — E1165 Type 2 diabetes mellitus with hyperglycemia: Secondary | ICD-10-CM | POA: Diagnosis not present

## 2018-02-17 DIAGNOSIS — J449 Chronic obstructive pulmonary disease, unspecified: Secondary | ICD-10-CM | POA: Diagnosis not present

## 2018-02-17 DIAGNOSIS — I130 Hypertensive heart and chronic kidney disease with heart failure and stage 1 through stage 4 chronic kidney disease, or unspecified chronic kidney disease: Secondary | ICD-10-CM | POA: Diagnosis not present

## 2018-02-20 DIAGNOSIS — J449 Chronic obstructive pulmonary disease, unspecified: Secondary | ICD-10-CM | POA: Diagnosis not present

## 2018-02-20 DIAGNOSIS — I130 Hypertensive heart and chronic kidney disease with heart failure and stage 1 through stage 4 chronic kidney disease, or unspecified chronic kidney disease: Secondary | ICD-10-CM | POA: Diagnosis not present

## 2018-02-20 DIAGNOSIS — E1165 Type 2 diabetes mellitus with hyperglycemia: Secondary | ICD-10-CM | POA: Diagnosis not present

## 2018-02-20 DIAGNOSIS — I5032 Chronic diastolic (congestive) heart failure: Secondary | ICD-10-CM | POA: Diagnosis not present

## 2018-02-20 DIAGNOSIS — N183 Chronic kidney disease, stage 3 (moderate): Secondary | ICD-10-CM | POA: Diagnosis not present

## 2018-02-20 DIAGNOSIS — E1122 Type 2 diabetes mellitus with diabetic chronic kidney disease: Secondary | ICD-10-CM | POA: Diagnosis not present

## 2018-02-23 DIAGNOSIS — E1165 Type 2 diabetes mellitus with hyperglycemia: Secondary | ICD-10-CM | POA: Diagnosis not present

## 2018-02-23 DIAGNOSIS — I5032 Chronic diastolic (congestive) heart failure: Secondary | ICD-10-CM | POA: Diagnosis not present

## 2018-02-23 DIAGNOSIS — E1122 Type 2 diabetes mellitus with diabetic chronic kidney disease: Secondary | ICD-10-CM | POA: Diagnosis not present

## 2018-02-23 DIAGNOSIS — I130 Hypertensive heart and chronic kidney disease with heart failure and stage 1 through stage 4 chronic kidney disease, or unspecified chronic kidney disease: Secondary | ICD-10-CM | POA: Diagnosis not present

## 2018-02-23 DIAGNOSIS — N183 Chronic kidney disease, stage 3 (moderate): Secondary | ICD-10-CM | POA: Diagnosis not present

## 2018-02-23 DIAGNOSIS — J449 Chronic obstructive pulmonary disease, unspecified: Secondary | ICD-10-CM | POA: Diagnosis not present

## 2018-02-24 DIAGNOSIS — E1122 Type 2 diabetes mellitus with diabetic chronic kidney disease: Secondary | ICD-10-CM | POA: Diagnosis not present

## 2018-02-24 DIAGNOSIS — J449 Chronic obstructive pulmonary disease, unspecified: Secondary | ICD-10-CM | POA: Diagnosis not present

## 2018-02-24 DIAGNOSIS — E1165 Type 2 diabetes mellitus with hyperglycemia: Secondary | ICD-10-CM | POA: Diagnosis not present

## 2018-02-24 DIAGNOSIS — I130 Hypertensive heart and chronic kidney disease with heart failure and stage 1 through stage 4 chronic kidney disease, or unspecified chronic kidney disease: Secondary | ICD-10-CM | POA: Diagnosis not present

## 2018-02-24 DIAGNOSIS — I5032 Chronic diastolic (congestive) heart failure: Secondary | ICD-10-CM | POA: Diagnosis not present

## 2018-02-24 DIAGNOSIS — N183 Chronic kidney disease, stage 3 (moderate): Secondary | ICD-10-CM | POA: Diagnosis not present

## 2018-02-27 DIAGNOSIS — E1165 Type 2 diabetes mellitus with hyperglycemia: Secondary | ICD-10-CM | POA: Diagnosis not present

## 2018-02-27 DIAGNOSIS — J449 Chronic obstructive pulmonary disease, unspecified: Secondary | ICD-10-CM | POA: Diagnosis not present

## 2018-02-27 DIAGNOSIS — I130 Hypertensive heart and chronic kidney disease with heart failure and stage 1 through stage 4 chronic kidney disease, or unspecified chronic kidney disease: Secondary | ICD-10-CM | POA: Diagnosis not present

## 2018-02-27 DIAGNOSIS — I5032 Chronic diastolic (congestive) heart failure: Secondary | ICD-10-CM | POA: Diagnosis not present

## 2018-02-27 DIAGNOSIS — N183 Chronic kidney disease, stage 3 (moderate): Secondary | ICD-10-CM | POA: Diagnosis not present

## 2018-02-27 DIAGNOSIS — E1122 Type 2 diabetes mellitus with diabetic chronic kidney disease: Secondary | ICD-10-CM | POA: Diagnosis not present

## 2018-02-28 DIAGNOSIS — J449 Chronic obstructive pulmonary disease, unspecified: Secondary | ICD-10-CM | POA: Diagnosis not present

## 2018-02-28 DIAGNOSIS — E1122 Type 2 diabetes mellitus with diabetic chronic kidney disease: Secondary | ICD-10-CM | POA: Diagnosis not present

## 2018-02-28 DIAGNOSIS — I130 Hypertensive heart and chronic kidney disease with heart failure and stage 1 through stage 4 chronic kidney disease, or unspecified chronic kidney disease: Secondary | ICD-10-CM | POA: Diagnosis not present

## 2018-02-28 DIAGNOSIS — N183 Chronic kidney disease, stage 3 (moderate): Secondary | ICD-10-CM | POA: Diagnosis not present

## 2018-02-28 DIAGNOSIS — I739 Peripheral vascular disease, unspecified: Secondary | ICD-10-CM | POA: Diagnosis not present

## 2018-02-28 DIAGNOSIS — I5032 Chronic diastolic (congestive) heart failure: Secondary | ICD-10-CM | POA: Diagnosis not present

## 2018-02-28 DIAGNOSIS — E1165 Type 2 diabetes mellitus with hyperglycemia: Secondary | ICD-10-CM | POA: Diagnosis not present

## 2018-03-01 DIAGNOSIS — N183 Chronic kidney disease, stage 3 (moderate): Secondary | ICD-10-CM | POA: Diagnosis not present

## 2018-03-01 DIAGNOSIS — N186 End stage renal disease: Secondary | ICD-10-CM | POA: Diagnosis not present

## 2018-03-01 DIAGNOSIS — I130 Hypertensive heart and chronic kidney disease with heart failure and stage 1 through stage 4 chronic kidney disease, or unspecified chronic kidney disease: Secondary | ICD-10-CM | POA: Diagnosis not present

## 2018-03-01 DIAGNOSIS — R300 Dysuria: Secondary | ICD-10-CM | POA: Diagnosis not present

## 2018-03-01 DIAGNOSIS — Z299 Encounter for prophylactic measures, unspecified: Secondary | ICD-10-CM | POA: Diagnosis not present

## 2018-03-01 DIAGNOSIS — I5032 Chronic diastolic (congestive) heart failure: Secondary | ICD-10-CM | POA: Diagnosis not present

## 2018-03-01 DIAGNOSIS — E1122 Type 2 diabetes mellitus with diabetic chronic kidney disease: Secondary | ICD-10-CM | POA: Diagnosis not present

## 2018-03-01 DIAGNOSIS — E1165 Type 2 diabetes mellitus with hyperglycemia: Secondary | ICD-10-CM | POA: Diagnosis not present

## 2018-03-01 DIAGNOSIS — J449 Chronic obstructive pulmonary disease, unspecified: Secondary | ICD-10-CM | POA: Diagnosis not present

## 2018-03-03 DIAGNOSIS — J449 Chronic obstructive pulmonary disease, unspecified: Secondary | ICD-10-CM | POA: Diagnosis not present

## 2018-03-03 DIAGNOSIS — I5032 Chronic diastolic (congestive) heart failure: Secondary | ICD-10-CM | POA: Diagnosis not present

## 2018-03-03 DIAGNOSIS — N183 Chronic kidney disease, stage 3 (moderate): Secondary | ICD-10-CM | POA: Diagnosis not present

## 2018-03-03 DIAGNOSIS — E1122 Type 2 diabetes mellitus with diabetic chronic kidney disease: Secondary | ICD-10-CM | POA: Diagnosis not present

## 2018-03-03 DIAGNOSIS — I130 Hypertensive heart and chronic kidney disease with heart failure and stage 1 through stage 4 chronic kidney disease, or unspecified chronic kidney disease: Secondary | ICD-10-CM | POA: Diagnosis not present

## 2018-03-03 DIAGNOSIS — E1165 Type 2 diabetes mellitus with hyperglycemia: Secondary | ICD-10-CM | POA: Diagnosis not present

## 2018-03-06 DIAGNOSIS — N183 Chronic kidney disease, stage 3 (moderate): Secondary | ICD-10-CM | POA: Diagnosis not present

## 2018-03-06 DIAGNOSIS — E1165 Type 2 diabetes mellitus with hyperglycemia: Secondary | ICD-10-CM | POA: Diagnosis not present

## 2018-03-06 DIAGNOSIS — E1122 Type 2 diabetes mellitus with diabetic chronic kidney disease: Secondary | ICD-10-CM | POA: Diagnosis not present

## 2018-03-06 DIAGNOSIS — I5032 Chronic diastolic (congestive) heart failure: Secondary | ICD-10-CM | POA: Diagnosis not present

## 2018-03-06 DIAGNOSIS — J449 Chronic obstructive pulmonary disease, unspecified: Secondary | ICD-10-CM | POA: Diagnosis not present

## 2018-03-06 DIAGNOSIS — I130 Hypertensive heart and chronic kidney disease with heart failure and stage 1 through stage 4 chronic kidney disease, or unspecified chronic kidney disease: Secondary | ICD-10-CM | POA: Diagnosis not present

## 2018-03-07 DIAGNOSIS — N183 Chronic kidney disease, stage 3 (moderate): Secondary | ICD-10-CM | POA: Diagnosis not present

## 2018-03-07 DIAGNOSIS — I5032 Chronic diastolic (congestive) heart failure: Secondary | ICD-10-CM | POA: Diagnosis not present

## 2018-03-07 DIAGNOSIS — J449 Chronic obstructive pulmonary disease, unspecified: Secondary | ICD-10-CM | POA: Diagnosis not present

## 2018-03-07 DIAGNOSIS — I130 Hypertensive heart and chronic kidney disease with heart failure and stage 1 through stage 4 chronic kidney disease, or unspecified chronic kidney disease: Secondary | ICD-10-CM | POA: Diagnosis not present

## 2018-03-07 DIAGNOSIS — E1165 Type 2 diabetes mellitus with hyperglycemia: Secondary | ICD-10-CM | POA: Diagnosis not present

## 2018-03-07 DIAGNOSIS — E1122 Type 2 diabetes mellitus with diabetic chronic kidney disease: Secondary | ICD-10-CM | POA: Diagnosis not present

## 2018-03-08 DIAGNOSIS — E1122 Type 2 diabetes mellitus with diabetic chronic kidney disease: Secondary | ICD-10-CM | POA: Diagnosis not present

## 2018-03-08 DIAGNOSIS — N183 Chronic kidney disease, stage 3 (moderate): Secondary | ICD-10-CM | POA: Diagnosis not present

## 2018-03-08 DIAGNOSIS — J449 Chronic obstructive pulmonary disease, unspecified: Secondary | ICD-10-CM | POA: Diagnosis not present

## 2018-03-08 DIAGNOSIS — I5032 Chronic diastolic (congestive) heart failure: Secondary | ICD-10-CM | POA: Diagnosis not present

## 2018-03-08 DIAGNOSIS — I130 Hypertensive heart and chronic kidney disease with heart failure and stage 1 through stage 4 chronic kidney disease, or unspecified chronic kidney disease: Secondary | ICD-10-CM | POA: Diagnosis not present

## 2018-03-08 DIAGNOSIS — E1165 Type 2 diabetes mellitus with hyperglycemia: Secondary | ICD-10-CM | POA: Diagnosis not present

## 2018-03-09 DIAGNOSIS — E1165 Type 2 diabetes mellitus with hyperglycemia: Secondary | ICD-10-CM | POA: Diagnosis not present

## 2018-03-09 DIAGNOSIS — J449 Chronic obstructive pulmonary disease, unspecified: Secondary | ICD-10-CM | POA: Diagnosis not present

## 2018-03-09 DIAGNOSIS — I130 Hypertensive heart and chronic kidney disease with heart failure and stage 1 through stage 4 chronic kidney disease, or unspecified chronic kidney disease: Secondary | ICD-10-CM | POA: Diagnosis not present

## 2018-03-09 DIAGNOSIS — I5032 Chronic diastolic (congestive) heart failure: Secondary | ICD-10-CM | POA: Diagnosis not present

## 2018-03-09 DIAGNOSIS — E1122 Type 2 diabetes mellitus with diabetic chronic kidney disease: Secondary | ICD-10-CM | POA: Diagnosis not present

## 2018-03-09 DIAGNOSIS — N183 Chronic kidney disease, stage 3 (moderate): Secondary | ICD-10-CM | POA: Diagnosis not present

## 2018-03-10 DIAGNOSIS — E1122 Type 2 diabetes mellitus with diabetic chronic kidney disease: Secondary | ICD-10-CM | POA: Diagnosis not present

## 2018-03-10 DIAGNOSIS — J449 Chronic obstructive pulmonary disease, unspecified: Secondary | ICD-10-CM | POA: Diagnosis not present

## 2018-03-10 DIAGNOSIS — I5032 Chronic diastolic (congestive) heart failure: Secondary | ICD-10-CM | POA: Diagnosis not present

## 2018-03-10 DIAGNOSIS — E1165 Type 2 diabetes mellitus with hyperglycemia: Secondary | ICD-10-CM | POA: Diagnosis not present

## 2018-03-10 DIAGNOSIS — I130 Hypertensive heart and chronic kidney disease with heart failure and stage 1 through stage 4 chronic kidney disease, or unspecified chronic kidney disease: Secondary | ICD-10-CM | POA: Diagnosis not present

## 2018-03-10 DIAGNOSIS — N183 Chronic kidney disease, stage 3 (moderate): Secondary | ICD-10-CM | POA: Diagnosis not present

## 2018-03-13 DIAGNOSIS — E1165 Type 2 diabetes mellitus with hyperglycemia: Secondary | ICD-10-CM | POA: Diagnosis not present

## 2018-03-13 DIAGNOSIS — E1122 Type 2 diabetes mellitus with diabetic chronic kidney disease: Secondary | ICD-10-CM | POA: Diagnosis not present

## 2018-03-13 DIAGNOSIS — N183 Chronic kidney disease, stage 3 (moderate): Secondary | ICD-10-CM | POA: Diagnosis not present

## 2018-03-13 DIAGNOSIS — I5032 Chronic diastolic (congestive) heart failure: Secondary | ICD-10-CM | POA: Diagnosis not present

## 2018-03-13 DIAGNOSIS — J449 Chronic obstructive pulmonary disease, unspecified: Secondary | ICD-10-CM | POA: Diagnosis not present

## 2018-03-13 DIAGNOSIS — I130 Hypertensive heart and chronic kidney disease with heart failure and stage 1 through stage 4 chronic kidney disease, or unspecified chronic kidney disease: Secondary | ICD-10-CM | POA: Diagnosis not present

## 2018-03-14 DIAGNOSIS — N183 Chronic kidney disease, stage 3 (moderate): Secondary | ICD-10-CM | POA: Diagnosis not present

## 2018-03-14 DIAGNOSIS — E1122 Type 2 diabetes mellitus with diabetic chronic kidney disease: Secondary | ICD-10-CM | POA: Diagnosis not present

## 2018-03-14 DIAGNOSIS — I5032 Chronic diastolic (congestive) heart failure: Secondary | ICD-10-CM | POA: Diagnosis not present

## 2018-03-14 DIAGNOSIS — E1165 Type 2 diabetes mellitus with hyperglycemia: Secondary | ICD-10-CM | POA: Diagnosis not present

## 2018-03-14 DIAGNOSIS — I130 Hypertensive heart and chronic kidney disease with heart failure and stage 1 through stage 4 chronic kidney disease, or unspecified chronic kidney disease: Secondary | ICD-10-CM | POA: Diagnosis not present

## 2018-03-14 DIAGNOSIS — J449 Chronic obstructive pulmonary disease, unspecified: Secondary | ICD-10-CM | POA: Diagnosis not present

## 2018-03-15 DIAGNOSIS — Z299 Encounter for prophylactic measures, unspecified: Secondary | ICD-10-CM | POA: Diagnosis not present

## 2018-03-15 DIAGNOSIS — I10 Essential (primary) hypertension: Secondary | ICD-10-CM | POA: Diagnosis not present

## 2018-03-15 DIAGNOSIS — J069 Acute upper respiratory infection, unspecified: Secondary | ICD-10-CM | POA: Diagnosis not present

## 2018-03-16 DIAGNOSIS — N183 Chronic kidney disease, stage 3 (moderate): Secondary | ICD-10-CM | POA: Diagnosis not present

## 2018-03-16 DIAGNOSIS — I130 Hypertensive heart and chronic kidney disease with heart failure and stage 1 through stage 4 chronic kidney disease, or unspecified chronic kidney disease: Secondary | ICD-10-CM | POA: Diagnosis not present

## 2018-03-16 DIAGNOSIS — E1165 Type 2 diabetes mellitus with hyperglycemia: Secondary | ICD-10-CM | POA: Diagnosis not present

## 2018-03-16 DIAGNOSIS — J449 Chronic obstructive pulmonary disease, unspecified: Secondary | ICD-10-CM | POA: Diagnosis not present

## 2018-03-16 DIAGNOSIS — E1122 Type 2 diabetes mellitus with diabetic chronic kidney disease: Secondary | ICD-10-CM | POA: Diagnosis not present

## 2018-03-16 DIAGNOSIS — I5032 Chronic diastolic (congestive) heart failure: Secondary | ICD-10-CM | POA: Diagnosis not present

## 2018-03-17 DIAGNOSIS — I130 Hypertensive heart and chronic kidney disease with heart failure and stage 1 through stage 4 chronic kidney disease, or unspecified chronic kidney disease: Secondary | ICD-10-CM | POA: Diagnosis not present

## 2018-03-17 DIAGNOSIS — E1165 Type 2 diabetes mellitus with hyperglycemia: Secondary | ICD-10-CM | POA: Diagnosis not present

## 2018-03-17 DIAGNOSIS — N183 Chronic kidney disease, stage 3 (moderate): Secondary | ICD-10-CM | POA: Diagnosis not present

## 2018-03-17 DIAGNOSIS — I5032 Chronic diastolic (congestive) heart failure: Secondary | ICD-10-CM | POA: Diagnosis not present

## 2018-03-17 DIAGNOSIS — E1122 Type 2 diabetes mellitus with diabetic chronic kidney disease: Secondary | ICD-10-CM | POA: Diagnosis not present

## 2018-03-17 DIAGNOSIS — J449 Chronic obstructive pulmonary disease, unspecified: Secondary | ICD-10-CM | POA: Diagnosis not present

## 2018-03-21 DIAGNOSIS — I5032 Chronic diastolic (congestive) heart failure: Secondary | ICD-10-CM | POA: Diagnosis not present

## 2018-03-21 DIAGNOSIS — J449 Chronic obstructive pulmonary disease, unspecified: Secondary | ICD-10-CM | POA: Diagnosis not present

## 2018-03-21 DIAGNOSIS — N183 Chronic kidney disease, stage 3 (moderate): Secondary | ICD-10-CM | POA: Diagnosis not present

## 2018-03-21 DIAGNOSIS — E1122 Type 2 diabetes mellitus with diabetic chronic kidney disease: Secondary | ICD-10-CM | POA: Diagnosis not present

## 2018-03-21 DIAGNOSIS — I130 Hypertensive heart and chronic kidney disease with heart failure and stage 1 through stage 4 chronic kidney disease, or unspecified chronic kidney disease: Secondary | ICD-10-CM | POA: Diagnosis not present

## 2018-03-21 DIAGNOSIS — E1165 Type 2 diabetes mellitus with hyperglycemia: Secondary | ICD-10-CM | POA: Diagnosis not present

## 2018-03-22 DIAGNOSIS — J069 Acute upper respiratory infection, unspecified: Secondary | ICD-10-CM | POA: Diagnosis not present

## 2018-03-22 DIAGNOSIS — Z299 Encounter for prophylactic measures, unspecified: Secondary | ICD-10-CM | POA: Diagnosis not present

## 2018-03-22 DIAGNOSIS — J449 Chronic obstructive pulmonary disease, unspecified: Secondary | ICD-10-CM | POA: Diagnosis not present

## 2018-03-22 DIAGNOSIS — E1165 Type 2 diabetes mellitus with hyperglycemia: Secondary | ICD-10-CM | POA: Diagnosis not present

## 2018-03-22 DIAGNOSIS — N186 End stage renal disease: Secondary | ICD-10-CM | POA: Diagnosis not present

## 2018-03-24 DIAGNOSIS — E1122 Type 2 diabetes mellitus with diabetic chronic kidney disease: Secondary | ICD-10-CM | POA: Diagnosis not present

## 2018-03-24 DIAGNOSIS — I130 Hypertensive heart and chronic kidney disease with heart failure and stage 1 through stage 4 chronic kidney disease, or unspecified chronic kidney disease: Secondary | ICD-10-CM | POA: Diagnosis not present

## 2018-03-24 DIAGNOSIS — N183 Chronic kidney disease, stage 3 (moderate): Secondary | ICD-10-CM | POA: Diagnosis not present

## 2018-03-24 DIAGNOSIS — J449 Chronic obstructive pulmonary disease, unspecified: Secondary | ICD-10-CM | POA: Diagnosis not present

## 2018-03-24 DIAGNOSIS — I5032 Chronic diastolic (congestive) heart failure: Secondary | ICD-10-CM | POA: Diagnosis not present

## 2018-03-24 DIAGNOSIS — E1165 Type 2 diabetes mellitus with hyperglycemia: Secondary | ICD-10-CM | POA: Diagnosis not present

## 2018-03-25 DIAGNOSIS — E1165 Type 2 diabetes mellitus with hyperglycemia: Secondary | ICD-10-CM | POA: Diagnosis not present

## 2018-05-17 DIAGNOSIS — Z299 Encounter for prophylactic measures, unspecified: Secondary | ICD-10-CM | POA: Diagnosis not present

## 2018-05-17 DIAGNOSIS — I10 Essential (primary) hypertension: Secondary | ICD-10-CM | POA: Diagnosis not present

## 2018-05-17 DIAGNOSIS — R21 Rash and other nonspecific skin eruption: Secondary | ICD-10-CM | POA: Diagnosis not present

## 2018-05-17 DIAGNOSIS — Z713 Dietary counseling and surveillance: Secondary | ICD-10-CM | POA: Diagnosis not present

## 2018-05-17 DIAGNOSIS — B369 Superficial mycosis, unspecified: Secondary | ICD-10-CM | POA: Diagnosis not present

## 2018-05-24 DIAGNOSIS — Z6829 Body mass index (BMI) 29.0-29.9, adult: Secondary | ICD-10-CM | POA: Diagnosis not present

## 2018-05-24 DIAGNOSIS — K5900 Constipation, unspecified: Secondary | ICD-10-CM | POA: Diagnosis not present

## 2018-05-24 DIAGNOSIS — R682 Dry mouth, unspecified: Secondary | ICD-10-CM | POA: Diagnosis not present

## 2018-05-24 DIAGNOSIS — I10 Essential (primary) hypertension: Secondary | ICD-10-CM | POA: Diagnosis not present

## 2018-05-24 DIAGNOSIS — Z299 Encounter for prophylactic measures, unspecified: Secondary | ICD-10-CM | POA: Diagnosis not present

## 2018-05-24 DIAGNOSIS — D649 Anemia, unspecified: Secondary | ICD-10-CM | POA: Diagnosis not present

## 2018-05-24 DIAGNOSIS — J309 Allergic rhinitis, unspecified: Secondary | ICD-10-CM | POA: Diagnosis not present

## 2018-05-31 DIAGNOSIS — R21 Rash and other nonspecific skin eruption: Secondary | ICD-10-CM | POA: Diagnosis not present

## 2018-05-31 DIAGNOSIS — I10 Essential (primary) hypertension: Secondary | ICD-10-CM | POA: Diagnosis not present

## 2018-05-31 DIAGNOSIS — J449 Chronic obstructive pulmonary disease, unspecified: Secondary | ICD-10-CM | POA: Diagnosis not present

## 2018-05-31 DIAGNOSIS — B372 Candidiasis of skin and nail: Secondary | ICD-10-CM | POA: Diagnosis not present

## 2018-05-31 DIAGNOSIS — E1165 Type 2 diabetes mellitus with hyperglycemia: Secondary | ICD-10-CM | POA: Diagnosis not present

## 2018-05-31 DIAGNOSIS — Z299 Encounter for prophylactic measures, unspecified: Secondary | ICD-10-CM | POA: Diagnosis not present

## 2018-05-31 DIAGNOSIS — Z6830 Body mass index (BMI) 30.0-30.9, adult: Secondary | ICD-10-CM | POA: Diagnosis not present

## 2018-06-07 DIAGNOSIS — Z6830 Body mass index (BMI) 30.0-30.9, adult: Secondary | ICD-10-CM | POA: Diagnosis not present

## 2018-06-07 DIAGNOSIS — Z299 Encounter for prophylactic measures, unspecified: Secondary | ICD-10-CM | POA: Diagnosis not present

## 2018-06-07 DIAGNOSIS — J449 Chronic obstructive pulmonary disease, unspecified: Secondary | ICD-10-CM | POA: Diagnosis not present

## 2018-06-07 DIAGNOSIS — E1165 Type 2 diabetes mellitus with hyperglycemia: Secondary | ICD-10-CM | POA: Diagnosis not present

## 2018-06-07 DIAGNOSIS — Z713 Dietary counseling and surveillance: Secondary | ICD-10-CM | POA: Diagnosis not present

## 2018-06-14 DIAGNOSIS — Z713 Dietary counseling and surveillance: Secondary | ICD-10-CM | POA: Diagnosis not present

## 2018-06-14 DIAGNOSIS — Z299 Encounter for prophylactic measures, unspecified: Secondary | ICD-10-CM | POA: Diagnosis not present

## 2018-06-14 DIAGNOSIS — Z6829 Body mass index (BMI) 29.0-29.9, adult: Secondary | ICD-10-CM | POA: Diagnosis not present

## 2018-06-14 DIAGNOSIS — I10 Essential (primary) hypertension: Secondary | ICD-10-CM | POA: Diagnosis not present

## 2018-06-14 DIAGNOSIS — B360 Pityriasis versicolor: Secondary | ICD-10-CM | POA: Diagnosis not present

## 2018-06-20 DIAGNOSIS — I10 Essential (primary) hypertension: Secondary | ICD-10-CM | POA: Diagnosis not present

## 2018-06-20 DIAGNOSIS — J449 Chronic obstructive pulmonary disease, unspecified: Secondary | ICD-10-CM | POA: Diagnosis not present

## 2018-06-20 DIAGNOSIS — E1165 Type 2 diabetes mellitus with hyperglycemia: Secondary | ICD-10-CM | POA: Diagnosis not present

## 2018-06-20 DIAGNOSIS — K130 Diseases of lips: Secondary | ICD-10-CM | POA: Diagnosis not present

## 2018-06-20 DIAGNOSIS — N186 End stage renal disease: Secondary | ICD-10-CM | POA: Diagnosis not present

## 2018-06-20 DIAGNOSIS — Z6830 Body mass index (BMI) 30.0-30.9, adult: Secondary | ICD-10-CM | POA: Diagnosis not present

## 2018-06-20 DIAGNOSIS — Z299 Encounter for prophylactic measures, unspecified: Secondary | ICD-10-CM | POA: Diagnosis not present

## 2018-07-05 DIAGNOSIS — I10 Essential (primary) hypertension: Secondary | ICD-10-CM | POA: Diagnosis not present

## 2018-07-05 DIAGNOSIS — E1165 Type 2 diabetes mellitus with hyperglycemia: Secondary | ICD-10-CM | POA: Diagnosis not present

## 2018-07-05 DIAGNOSIS — Z299 Encounter for prophylactic measures, unspecified: Secondary | ICD-10-CM | POA: Diagnosis not present

## 2018-07-05 DIAGNOSIS — N186 End stage renal disease: Secondary | ICD-10-CM | POA: Diagnosis not present

## 2018-07-05 DIAGNOSIS — Z6829 Body mass index (BMI) 29.0-29.9, adult: Secondary | ICD-10-CM | POA: Diagnosis not present

## 2018-07-05 DIAGNOSIS — J449 Chronic obstructive pulmonary disease, unspecified: Secondary | ICD-10-CM | POA: Diagnosis not present

## 2018-07-12 DIAGNOSIS — K5900 Constipation, unspecified: Secondary | ICD-10-CM | POA: Diagnosis not present

## 2018-07-12 DIAGNOSIS — J449 Chronic obstructive pulmonary disease, unspecified: Secondary | ICD-10-CM | POA: Diagnosis not present

## 2018-07-12 DIAGNOSIS — N186 End stage renal disease: Secondary | ICD-10-CM | POA: Diagnosis not present

## 2018-07-12 DIAGNOSIS — D649 Anemia, unspecified: Secondary | ICD-10-CM | POA: Diagnosis not present

## 2018-07-12 DIAGNOSIS — Z299 Encounter for prophylactic measures, unspecified: Secondary | ICD-10-CM | POA: Diagnosis not present

## 2018-07-12 DIAGNOSIS — Z6829 Body mass index (BMI) 29.0-29.9, adult: Secondary | ICD-10-CM | POA: Diagnosis not present

## 2018-07-12 DIAGNOSIS — I10 Essential (primary) hypertension: Secondary | ICD-10-CM | POA: Diagnosis not present

## 2018-07-19 DIAGNOSIS — N186 End stage renal disease: Secondary | ICD-10-CM | POA: Diagnosis not present

## 2018-07-19 DIAGNOSIS — J449 Chronic obstructive pulmonary disease, unspecified: Secondary | ICD-10-CM | POA: Diagnosis not present

## 2018-07-19 DIAGNOSIS — E1165 Type 2 diabetes mellitus with hyperglycemia: Secondary | ICD-10-CM | POA: Diagnosis not present

## 2018-07-19 DIAGNOSIS — I10 Essential (primary) hypertension: Secondary | ICD-10-CM | POA: Diagnosis not present

## 2018-07-19 DIAGNOSIS — Z299 Encounter for prophylactic measures, unspecified: Secondary | ICD-10-CM | POA: Diagnosis not present

## 2018-07-19 DIAGNOSIS — L709 Acne, unspecified: Secondary | ICD-10-CM | POA: Diagnosis not present

## 2018-07-19 DIAGNOSIS — Z6826 Body mass index (BMI) 26.0-26.9, adult: Secondary | ICD-10-CM | POA: Diagnosis not present

## 2018-08-01 DIAGNOSIS — I739 Peripheral vascular disease, unspecified: Secondary | ICD-10-CM | POA: Diagnosis not present

## 2018-08-08 DIAGNOSIS — L03032 Cellulitis of left toe: Secondary | ICD-10-CM | POA: Diagnosis not present

## 2018-08-08 DIAGNOSIS — L600 Ingrowing nail: Secondary | ICD-10-CM | POA: Diagnosis not present

## 2018-08-08 DIAGNOSIS — M79672 Pain in left foot: Secondary | ICD-10-CM | POA: Diagnosis not present

## 2018-08-08 DIAGNOSIS — M79675 Pain in left toe(s): Secondary | ICD-10-CM | POA: Diagnosis not present

## 2018-08-16 DIAGNOSIS — Z299 Encounter for prophylactic measures, unspecified: Secondary | ICD-10-CM | POA: Diagnosis not present

## 2018-08-16 DIAGNOSIS — J449 Chronic obstructive pulmonary disease, unspecified: Secondary | ICD-10-CM | POA: Diagnosis not present

## 2018-08-16 DIAGNOSIS — E1165 Type 2 diabetes mellitus with hyperglycemia: Secondary | ICD-10-CM | POA: Diagnosis not present

## 2018-08-16 DIAGNOSIS — L600 Ingrowing nail: Secondary | ICD-10-CM | POA: Diagnosis not present

## 2018-08-16 DIAGNOSIS — Z6828 Body mass index (BMI) 28.0-28.9, adult: Secondary | ICD-10-CM | POA: Diagnosis not present

## 2018-08-16 DIAGNOSIS — I10 Essential (primary) hypertension: Secondary | ICD-10-CM | POA: Diagnosis not present

## 2018-08-16 DIAGNOSIS — N186 End stage renal disease: Secondary | ICD-10-CM | POA: Diagnosis not present

## 2018-08-22 ENCOUNTER — Emergency Department (HOSPITAL_COMMUNITY): Payer: Medicare Other

## 2018-08-22 ENCOUNTER — Emergency Department (HOSPITAL_COMMUNITY)
Admission: EM | Admit: 2018-08-22 | Discharge: 2018-08-22 | Disposition: A | Discharge status: Home / Self Care | Payer: Medicare Other | Attending: Emergency Medicine | Admitting: Emergency Medicine

## 2018-08-22 ENCOUNTER — Other Ambulatory Visit: Payer: Self-pay

## 2018-08-22 ENCOUNTER — Encounter (HOSPITAL_COMMUNITY): Payer: Self-pay | Admitting: Emergency Medicine

## 2018-08-22 DIAGNOSIS — R609 Edema, unspecified: Secondary | ICD-10-CM | POA: Diagnosis not present

## 2018-08-22 DIAGNOSIS — Z794 Long term (current) use of insulin: Secondary | ICD-10-CM | POA: Insufficient documentation

## 2018-08-22 DIAGNOSIS — I130 Hypertensive heart and chronic kidney disease with heart failure and stage 1 through stage 4 chronic kidney disease, or unspecified chronic kidney disease: Secondary | ICD-10-CM | POA: Insufficient documentation

## 2018-08-22 DIAGNOSIS — I82532 Chronic embolism and thrombosis of left popliteal vein: Secondary | ICD-10-CM | POA: Diagnosis not present

## 2018-08-22 DIAGNOSIS — Z79899 Other long term (current) drug therapy: Secondary | ICD-10-CM | POA: Diagnosis not present

## 2018-08-22 DIAGNOSIS — I82503 Chronic embolism and thrombosis of unspecified deep veins of lower extremity, bilateral: Secondary | ICD-10-CM | POA: Insufficient documentation

## 2018-08-22 DIAGNOSIS — E039 Hypothyroidism, unspecified: Secondary | ICD-10-CM | POA: Insufficient documentation

## 2018-08-22 DIAGNOSIS — Z87891 Personal history of nicotine dependence: Secondary | ICD-10-CM | POA: Insufficient documentation

## 2018-08-22 DIAGNOSIS — Z7901 Long term (current) use of anticoagulants: Secondary | ICD-10-CM | POA: Diagnosis not present

## 2018-08-22 DIAGNOSIS — R5381 Other malaise: Secondary | ICD-10-CM | POA: Diagnosis not present

## 2018-08-22 DIAGNOSIS — I5032 Chronic diastolic (congestive) heart failure: Secondary | ICD-10-CM | POA: Diagnosis not present

## 2018-08-22 DIAGNOSIS — Z7401 Bed confinement status: Secondary | ICD-10-CM | POA: Diagnosis not present

## 2018-08-22 DIAGNOSIS — R69 Illness, unspecified: Secondary | ICD-10-CM | POA: Diagnosis not present

## 2018-08-22 DIAGNOSIS — J9811 Atelectasis: Secondary | ICD-10-CM | POA: Diagnosis not present

## 2018-08-22 DIAGNOSIS — N183 Chronic kidney disease, stage 3 (moderate): Secondary | ICD-10-CM | POA: Insufficient documentation

## 2018-08-22 DIAGNOSIS — R600 Localized edema: Secondary | ICD-10-CM | POA: Insufficient documentation

## 2018-08-22 DIAGNOSIS — I82512 Chronic embolism and thrombosis of left femoral vein: Secondary | ICD-10-CM | POA: Diagnosis not present

## 2018-08-22 DIAGNOSIS — R2243 Localized swelling, mass and lump, lower limb, bilateral: Secondary | ICD-10-CM | POA: Diagnosis present

## 2018-08-22 LAB — CBC WITH DIFFERENTIAL/PLATELET
Abs Immature Granulocytes: 0.03 10*3/uL (ref 0.00–0.07)
Basophils Absolute: 0 10*3/uL (ref 0.0–0.1)
Basophils Relative: 0 %
Eosinophils Absolute: 0.1 10*3/uL (ref 0.0–0.5)
Eosinophils Relative: 1 %
HCT: 31.2 % — ABNORMAL LOW (ref 36.0–46.0)
Hemoglobin: 9.6 g/dL — ABNORMAL LOW (ref 12.0–15.0)
Immature Granulocytes: 1 %
Lymphocytes Relative: 27 %
Lymphs Abs: 1.7 10*3/uL (ref 0.7–4.0)
MCH: 28.3 pg (ref 26.0–34.0)
MCHC: 30.8 g/dL (ref 30.0–36.0)
MCV: 92 fL (ref 80.0–100.0)
Monocytes Absolute: 0.6 10*3/uL (ref 0.1–1.0)
Monocytes Relative: 10 %
Neutro Abs: 3.8 10*3/uL (ref 1.7–7.7)
Neutrophils Relative %: 61 %
Platelets: 258 10*3/uL (ref 150–400)
RBC: 3.39 MIL/uL — ABNORMAL LOW (ref 3.87–5.11)
RDW: 13.9 % (ref 11.5–15.5)
WBC: 6.1 10*3/uL (ref 4.0–10.5)
nRBC: 0 % (ref 0.0–0.2)

## 2018-08-22 LAB — BASIC METABOLIC PANEL
Anion gap: 10 (ref 5–15)
BUN: 28 mg/dL — ABNORMAL HIGH (ref 8–23)
CO2: 26 mmol/L (ref 22–32)
Calcium: 9.2 mg/dL (ref 8.9–10.3)
Chloride: 103 mmol/L (ref 98–111)
Creatinine, Ser: 1.18 mg/dL — ABNORMAL HIGH (ref 0.44–1.00)
GFR calc Af Amer: 44 mL/min — ABNORMAL LOW (ref >60)
GFR calc non Af Amer: 38 mL/min — ABNORMAL LOW (ref >60)
Glucose, Bld: 99 mg/dL (ref 70–99)
Potassium: 4.7 mmol/L (ref 3.5–5.1)
Sodium: 139 mmol/L (ref 135–145)

## 2018-08-22 LAB — BRAIN NATRIURETIC PEPTIDE: B Natriuretic Peptide: 54 pg/mL (ref 0.0–100.0)

## 2018-08-22 NOTE — ED Triage Notes (Addendum)
Pt from [facility].  A/o.  C/o of bilateral ankle swelling and calf pain

## 2018-08-22 NOTE — ED Notes (Signed)
ED Provider at bedside. 

## 2018-08-22 NOTE — Discharge Instructions (Addendum)
Take your usual prescriptions, including your Eliquis and your lasix, as previously directed.  Call your regular medical doctor today to schedule a follow up appointment within the week.  Return to the Emergency Department immediately sooner if worsening.

## 2018-08-22 NOTE — ED Notes (Signed)
[place], pt's niece was updated and made aware of pt being transported via ambulance back to [place]

## 2018-08-22 NOTE — ED Provider Notes (Signed)
[facility] EMERGENCY DEPARTMENT Provider Note   CSN: [id number] Arrival date & time: 08/22/18  1106     History   Chief Complaint Chief Complaint  Patient presents with   Leg Swelling    HPI Christine Hancock is a 85 y.o. female.     HPI  Pt was seen at 1125. Per EMS, NH report and pt: c/o gradual onset and persistence of constant bilateral lower "ankles swelling" for the past 1 week. Pt states her "calves have been hurting too" over the past 3 to 4 days. Pt is unsure if she takes a blood thinner or a diuretic. Pt is concerned regarding "another blood clot in my legs." Denies CP/palpitations, no SOB/cough, no abd pain, no N/V/D, no back pain, no injury, no rash, no fevers.    Past Medical History:  Diagnosis Date   Acute gastrointestinal hemorrhage    Anemia, unspecified    Anxiety    Chronic kidney disease, stage I    DVT, lower extremity, recurrent (HCC)    GERD (gastroesophageal reflux disease)    Hypercholesterolemia    Hyperglycemic hyperosmolar nonketotic coma ([place]) 12/05/2017   Hypotension    Hypothyroidism    Phlebitis    Pure hypercholesterolemia     Patient Active Problem List   Diagnosis Date Noted   Non-ketotic hyperosmolar coma ([place]) 12/05/2017   GERD (gastroesophageal reflux disease) 12/05/2017   Hypothermia 12/05/2017   DNR (do not resuscitate) 12/05/2017   Anemia due to chronic blood loss 08/28/2017   Iron deficiency anemia due to chronic blood loss    GI bleed 08/25/2017   GIB (gastrointestinal bleeding) 08/24/2017   Chronic diastolic CHF (congestive heart failure) ([place]) 08/24/2017   Chronic kidney disease (CKD), stage III (moderate) ([place]) 08/24/2017   Symptomatic anemia    Acute on chronic diastolic heart failure ([place]) 10/30/2016   Acute lower UTI 10/27/2016   AKI (acute kidney injury) ([place]) 10/27/2016   Dehydration 10/24/2011   Acute on chronic renal failure ([place]) 10/24/2011   Failure to thrive in adult  10/24/2011   Microcytic anemia 10/24/2011   Anxiety 10/24/2011   Hypothyroidism 10/24/2011    Past Surgical History:  Procedure Laterality Date   ABDOMINAL HYSTERECTOMY     partial   CATARACT EXTRACTION W/PHACO Right 06/28/2013   Procedure: CATARACT EXTRACTION PHACO AND INTRAOCULAR LENS PLACEMENT ([place]);  Surgeon: Tonny Branch, MD;  Location: [facility];  Service: Ophthalmology;  Laterality: Right;  CDE:13.79   CATARACT EXTRACTION W/PHACO Left 07/26/2013   Procedure: CATARACT EXTRACTION PHACO AND INTRAOCULAR LENS PLACEMENT LEFT EYE;  Surgeon: Tonny Branch, MD;  Location: [facility];  Service: Ophthalmology;  Laterality: Left;  CDE: 11.76   CHOLECYSTECTOMY     ESOPHAGEAL DILATION     ESOPHAGOGASTRODUODENOSCOPY  2013   Dr. Gala Romney: noncritical Schatzki's ring, scattered erosions, normal duodenum, path with moderate chronic atrophic gastritis   TONSILLECTOMY       OB History   No obstetric history on file.      Home Medications    Prior to Admission medications   Medication Sig Start Date End Date Taking? Authorizing Provider  benzoyl peroxide 10 % gel Apply 1 application topically daily. To right side of face   Yes [provider]  insulin glargine (LANTUS) 100 UNIT/ML injection Inject 0.18 mLs (18 Units total) into the skin daily. Patient taking differently: Inject 12 Units into the skin daily.  12/13/17  Yes Dondiego, Richard, MD  polyethylene glycol powder (GLYCOLAX/MIRALAX) powder Take 8.6 g by mouth daily.  09/20/16  Yes [provider]  traZODone (DESYREL) 50 MG tablet Take 25 mg by mouth at bedtime.   Yes [provider]  umeclidinium-vilanterol (ANORO ELLIPTA) 62.5-25 MCG/INH AEPB Inhale 1 puff into the lungs daily.   Yes [provider]  albuterol (PROVENTIL HFA;VENTOLIN HFA) 108 (90 Base) MCG/ACT inhaler Inhale 2 puffs into the lungs every 6 (six) hours as needed for wheezing or shortness of breath. 10/28/16   Sinda Du, MD  apixaban  (ELIQUIS) 2.5 MG TABS tablet Take 1 tablet (2.5 mg total) by mouth 2 (two) times daily. 08/29/17   Sinda Du, MD  carboxymethylcellulose (REFRESH PLUS) 0.5 % SOLN Place 1 drop into both eyes 4 (four) times daily.    [provider]  ferrous sulfate 325 (65 FE) MG tablet Take 1 tablet (325 mg total) by mouth daily. 08/29/17 08/29/18  Sinda Du, MD  furosemide (LASIX) 20 MG tablet Take 20 mg by mouth. Give  3 tablets by mouth one time a day for  CHF    [provider]  gabapentin (NEURONTIN) 600 MG tablet Take 600 mg by mouth at bedtime.     [provider]  levothyroxine (SYNTHROID, LEVOTHROID) 88 MCG tablet Take 88 mcg by mouth daily before breakfast.    [provider]  LINZESS 290 MCG CAPS capsule Take 290 mcg by mouth daily before breakfast.  09/07/16   [provider]  neomycin-polymyxin-dexameth (MAXITROL) 0.1 % OINT Place 1 application into both eyes 3 (three) times daily. For dry eyes    [provider]  NIFEdipine (PROCARDIA-XL/ADALAT CC) 30 MG 24 hr tablet Take 30 mg by mouth daily.    [provider]  omeprazole (PRILOSEC) 20 MG capsule Take 20 mg by mouth 2 (two) times daily before a meal.     [provider]  oxybutynin (DITROPAN XL) 15 MG 24 hr tablet Take 15 mg by mouth daily.    [provider]  pravastatin (PRAVACHOL) 40 MG tablet Take 40 mg by mouth at bedtime.  09/07/16   [provider]  risperiDONE (RISPERDAL) 0.25 MG tablet Take 0.25 mg by mouth daily.    [provider]    Family History Family History  Problem Relation Age of Onset   Diabetes Mother    Diabetes Brother    Anemia Brother    Anemia Sister    Coronary artery disease Other     Social History Social History   Tobacco Use   Smoking status: Former Smoker    Packs/day: 0.25    Years: 15.00    Pack years: 3.75    Types: Cigarettes    Quit date: 10/22/2005    Years since quitting: 12.8    Smokeless tobacco: Never Used  Substance Use Topics   Alcohol use: No   Drug use: No     Allergies   Patient has no known allergies.   Review of Systems Review of Systems ROS: Statement: All systems negative except as marked or noted in the HPI; Constitutional: Negative for fever and chills. ; ; Eyes: Negative for eye pain, redness and discharge. ; ; ENMT: Negative for ear pain, hoarseness, nasal congestion, sinus pressure and sore throat. ; ; Cardiovascular: Negative for chest pain, palpitations, diaphoresis, dyspnea and +peripheral edema. ; ; Respiratory: Negative for cough, wheezing and stridor. ; ; Gastrointestinal: Negative for nausea, vomiting, diarrhea, abdominal pain, blood in stool, hematemesis, jaundice and rectal bleeding. . ; ; Genitourinary: Negative for dysuria, flank pain and hematuria. ; ;  Musculoskeletal: Negative for back pain and neck pain. Negative for swelling and trauma.; ; Skin: Negative for pruritus, rash, abrasions, blisters, bruising and skin lesion.; ; Neuro: Negative for headache, lightheadedness and neck stiffness. Negative for weakness, altered level of consciousness, altered mental status, extremity weakness, paresthesias, involuntary movement, seizure and syncope.       Physical Exam Updated Vital Signs BP 123/88 (BP Location: Right Arm)    Pulse 73    Temp 97.8 F (36.6 C) (Oral)    Resp 15    Ht 5\' 2"  (1.575 m)    Wt 70.3 kg    SpO2 100%    BMI 28.35 kg/m   Physical Exam 1130: Physical examination:  Nursing notes reviewed; Vital signs and O2 SAT reviewed;  Constitutional: Well developed, Well nourished, Well hydrated, In no acute distress; Head:  Normocephalic, atraumatic; Eyes: EOMI, PERRL, No scleral icterus; ENMT: Mouth and pharynx normal, Mucous membranes moist; Neck: Supple, Full range of motion, No lymphadenopathy; Cardiovascular: Regular rate and rhythm, No gallop; Respiratory: Breath sounds clear & equal bilaterally, No wheezes.  Speaking full  sentences with ease, Normal respiratory effort/excursion; Chest: Nontender, Movement normal; Abdomen: Soft, Nontender, Nondistended, Normal bowel sounds; Genitourinary: No CVA tenderness; Extremities: Peripheral pulses normal, No tenderness, +1 pedal edema feet to ankles bilat with calf asymmetry R<L. No rash..; Neuro: AA&Ox3, Major CN grossly intact.  Speech clear. No gross focal motor or sensory deficits in extremities.; Skin: Color normal, Warm, Dry.   ED Treatments / Results  Labs (all labs ordered are listed, but only abnormal results are displayed)   EKG    Radiology   Procedures Procedures (including critical care time)  Medications Ordered in ED Medications - No data to display   Initial Impression / Assessment and Plan / ED Course  I have reviewed the triage vital signs and the nursing notes.  Pertinent labs & imaging results that were available during my care of the patient were reviewed by me and considered in my medical decision making (see chart for details).    MDM Reviewed: previous chart, nursing note and vitals Reviewed previous: labs Interpretation: labs, x-ray and ultrasound   Results for orders placed or performed during the hospital encounter of 30/86/57  Basic metabolic panel  Result Value Ref Range   Sodium 139 135 - 145 mmol/L   Potassium 4.7 3.5 - 5.1 mmol/L   Chloride 103 98 - 111 mmol/L   CO2 26 22 - 32 mmol/L   Glucose, Bld 99 70 - 99 mg/dL   BUN 28 (H) 8 - 23 mg/dL   Creatinine, Ser 1.18 (H) 0.44 - 1.00 mg/dL   Calcium 9.2 8.9 - 10.3 mg/dL   GFR calc non Af Amer 38 (L) >60 mL/min   GFR calc Af Amer 44 (L) >60 mL/min   Anion gap 10 5 - 15  CBC with Differential  Result Value Ref Range   WBC 6.1 4.0 - 10.5 K/uL   RBC 3.39 (L) 3.87 - 5.11 MIL/uL   Hemoglobin 9.6 (L) 12.0 - 15.0 g/dL   HCT 31.2 (L) 36.0 - 46.0 %   MCV 92.0 80.0 - 100.0 fL   MCH 28.3 26.0 - 34.0 pg   MCHC 30.8 30.0 - 36.0 g/dL   RDW 13.9 11.5 - 15.5 %   Platelets 258  150 - 400 K/uL   nRBC 0.0 0.0 - 0.2 %   Neutrophils Relative % 61 %   Neutro Abs 3.8 1.7 - 7.7 K/uL   Lymphocytes  Relative 27 %   Lymphs Abs 1.7 0.7 - 4.0 K/uL   Monocytes Relative 10 %   Monocytes Absolute 0.6 0.1 - 1.0 K/uL   Eosinophils Relative 1 %   Eosinophils Absolute 0.1 0.0 - 0.5 K/uL   Basophils Relative 0 %   Basophils Absolute 0.0 0.0 - 0.1 K/uL   Immature Granulocytes 1 %   Abs Immature Granulocytes 0.03 0.00 - 0.07 K/uL  Brain natriuretic peptide  Result Value Ref Range   B Natriuretic Peptide 54.0 0.0 - 100.0 pg/mL   Dg Chest 2 View Result Date: 08/22/2018 CLINICAL DATA:  Leg swelling EXAM: CHEST - 2 VIEW COMPARISON:  12/06/2017 FINDINGS: Upper normal heart size. Mediastinal contours and pulmonary vascularity normal. Atherosclerotic calcification aorta. Mild bronchitic changes with bibasilar atelectasis. Remaining lungs clear. No pleural effusion or pneumothorax. Bones demineralized. IMPRESSION: Bronchitic changes with bibasilar atelectasis. Electronically Signed   By: Lavonia Dana M.D.   On: 08/22/2018 13:05   US Venous Img Lower Bilateral Result Date: 08/22/2018 CLINICAL DATA:  Swelling and pain in lower extremities EXAM: BILATERAL LOWER EXTREMITY VENOUS DOPPLER ULTRASOUND TECHNIQUE: Gray-scale sonography with graded compression, as well as color Doppler and duplex ultrasound were performed to evaluate the lower extremity deep venous systems from the level of the common femoral vein and including the common femoral, femoral, profunda femoral, popliteal and calf veins including the posterior tibial, peroneal and gastrocnemius veins when visible. The superficial great saphenous vein was also interrogated. Spectral Doppler was utilized to evaluate flow at rest and with distal augmentation maneuvers in the common femoral, femoral and popliteal veins. COMPARISON:  06/19/2010 FINDINGS: RIGHT LOWER EXTREMITY Common Femoral Vein: No evidence of thrombus. Normal compressibility,  respiratory phasicity and response to augmentation. Saphenofemoral Junction: No evidence of thrombus. Normal compressibility and flow on color Doppler imaging. Profunda Femoral Vein: No evidence of thrombus. Normal compressibility and flow on color Doppler imaging. Femoral Vein: No evidence of thrombus. Normal compressibility, respiratory phasicity and response to augmentation. Popliteal Vein: No evidence of thrombus. Normal compressibility, respiratory phasicity and response to augmentation. Calf Veins: Thrombus identified within RIGHT calf veins including posterior tibial, peroneal and anterior tibial period impaired spontaneous flow and compressibility. Superficial Great Saphenous Vein: No evidence of thrombus. Normal compressibility. Venous Reflux:  None. Other Findings:  None. LEFT LOWER EXTREMITY Common Femoral Vein: No evidence of thrombus. Normal compressibility, respiratory phasicity and response to augmentation. Saphenofemoral Junction: No evidence of thrombus. Normal compressibility and flow on color Doppler imaging. Profunda Femoral Vein: No evidence of thrombus. Normal compressibility and flow on color Doppler imaging. Femoral Vein: Chronic thrombus with impaired venous flow and compressibility Popliteal Vein: Chronic thrombus with impaired venous flow and compressibility Calf Veins: Thrombus identified with impaired spontaneous venous flow and compressibility. Superficial Great Saphenous Vein: No evidence of thrombus. Normal compressibility. Venous Reflux:  None. Other Findings:  None. IMPRESSION: Chronic deep venous thrombosis involving the LEFT femoral and popliteal veins extending in the calf, unchanged from previous exam. Age-indeterminate nonocclusive thrombus within the deep venous system of the RIGHT calf. Electronically Signed   By: Lavonia Dana M.D.   On: 08/22/2018 13:13    Christine Hancock was evaluated in Emergency Department on 08/22/2018 for the symptoms described in the history of present  illness. She was evaluated in the context of the global COVID-19 pandemic, which necessitated consideration that the patient might be at risk for infection with the SARS-CoV-2 virus that causes COVID-19. Institutional protocols and algorithms that pertain to the evaluation of patients  at risk for COVID-19 are in a state of rapid change based on information released by regulatory bodies including the CDC and federal and state organizations. These policies and algorithms were followed during the patient's care in the ED.    1335:  Pt already taking Eliquis for chronic DVT's. No fluid overload on CXR and pt is already taking lasix. No clear indication for admission at this time. T/C returned from Vascular Surgery Dr. Trula Slade, case discussed, including:  HPI, pertinent PM/SHx, VS/PE, dx testing, ED course and treatment:  no acute intervention needed at this time, have pt continue Eliquis, f/u PMD. Dx and testing, as well as incidental finding(s), d/w pt.  Questions answered.  Verb understanding, agreeable to d/c back to NH with outpt f/u.           Final Clinical Impressions(s) / ED Diagnoses   Final diagnoses:  None    ED Discharge Orders    None       Francine Graven, DO 08/26/18 8811

## 2018-08-30 DIAGNOSIS — Z789 Other specified health status: Secondary | ICD-10-CM | POA: Diagnosis not present

## 2018-08-30 DIAGNOSIS — Z6829 Body mass index (BMI) 29.0-29.9, adult: Secondary | ICD-10-CM | POA: Diagnosis not present

## 2018-08-30 DIAGNOSIS — L600 Ingrowing nail: Secondary | ICD-10-CM | POA: Diagnosis not present

## 2018-08-30 DIAGNOSIS — Z299 Encounter for prophylactic measures, unspecified: Secondary | ICD-10-CM | POA: Diagnosis not present

## 2018-08-30 DIAGNOSIS — J449 Chronic obstructive pulmonary disease, unspecified: Secondary | ICD-10-CM | POA: Diagnosis not present

## 2018-08-30 DIAGNOSIS — N186 End stage renal disease: Secondary | ICD-10-CM | POA: Diagnosis not present

## 2018-08-30 DIAGNOSIS — E1165 Type 2 diabetes mellitus with hyperglycemia: Secondary | ICD-10-CM | POA: Diagnosis not present

## 2018-09-20 ENCOUNTER — Other Ambulatory Visit: Payer: Self-pay

## 2018-09-20 IMAGING — CT CT HEAD W/O CM
3 series · 15 of 47 positions shown, 18 images · non-contrast
Comparison: 10/23/2011

CLINICAL DATA: Dizziness today

EXAM:
CT HEAD WITHOUT CONTRAST
TECHNIQUE: Contiguous axial images were obtained from the base of the skull
through the vertex without intravenous contrast.

[Series 2: head wo · axial · 0.41mm/px · z∈[+19,+144]mm · 9 of 31 slices shown, 12 images]
[im 3/31  brain]
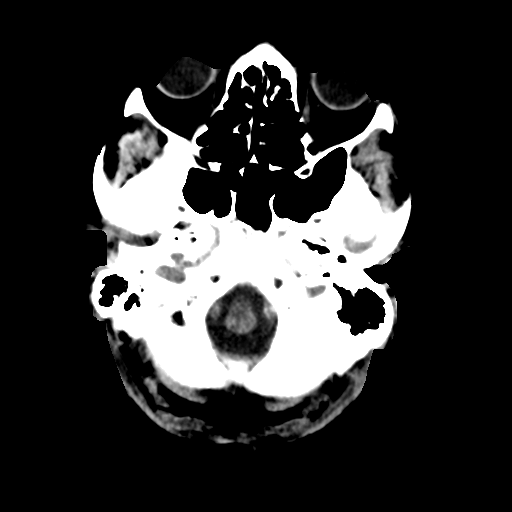
[im 3/31  bone]
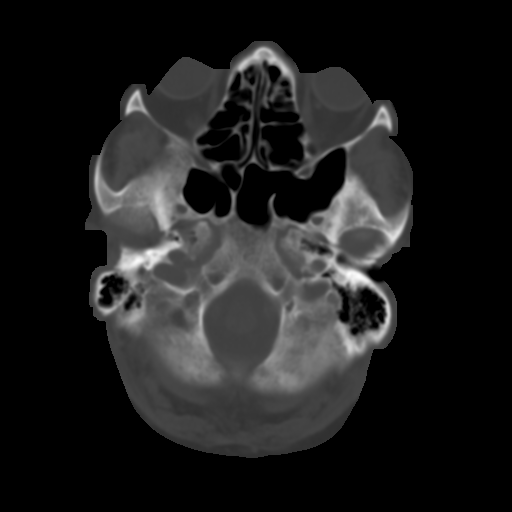
[im 6/31  brain]
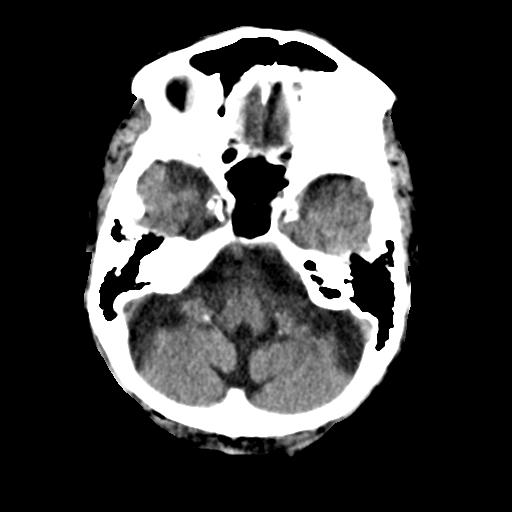
[im 9/31  brain]
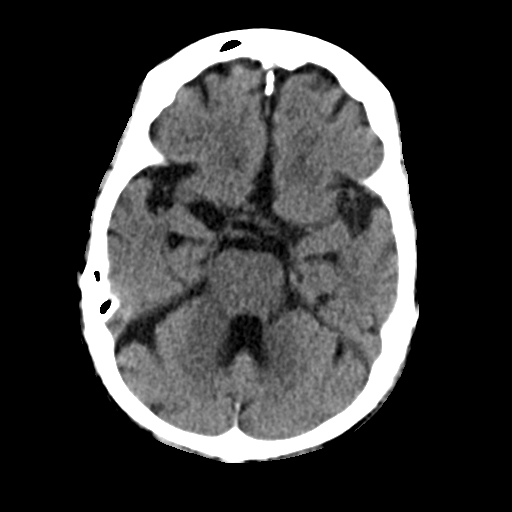
[im 12/31  brain]
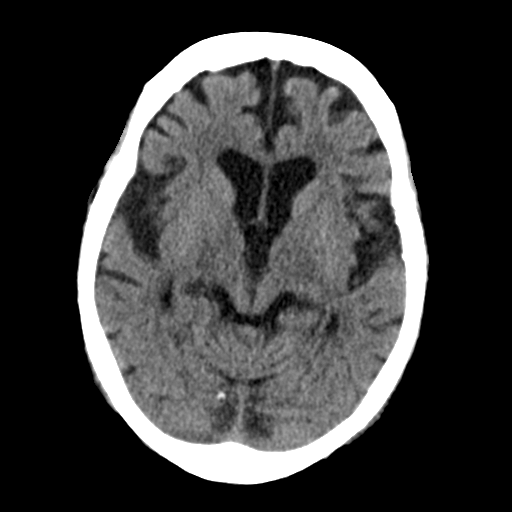
[im 16/31  brain]
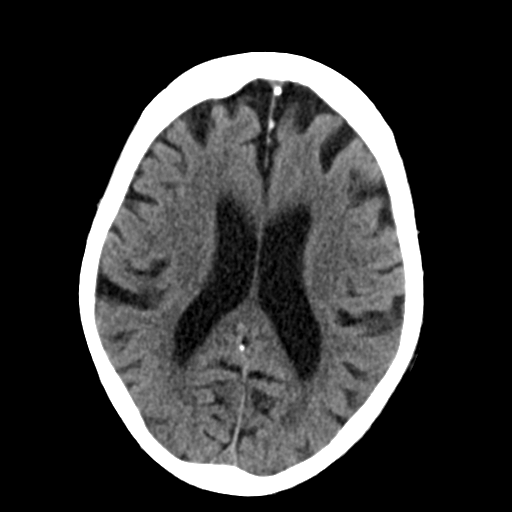
[im 16/31  bone]
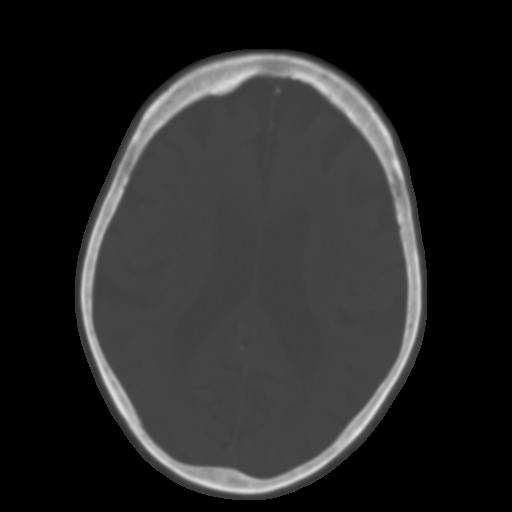
[im 19/31  brain]
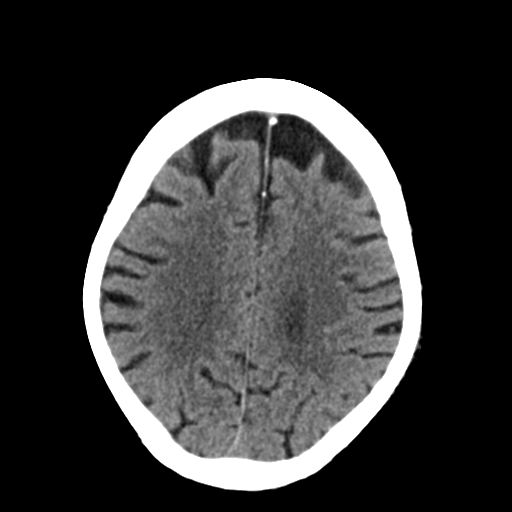
[im 22/31  brain]
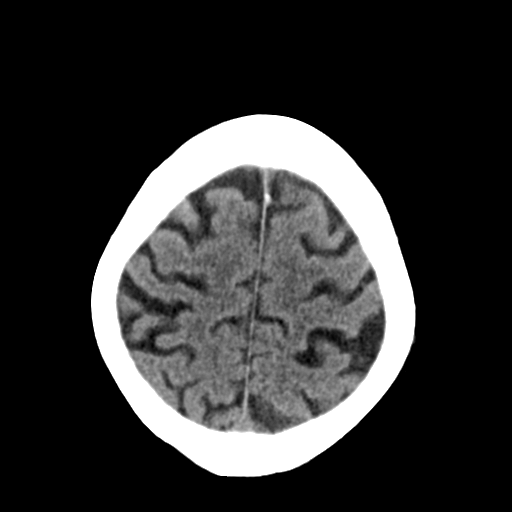
[im 25/31  brain]
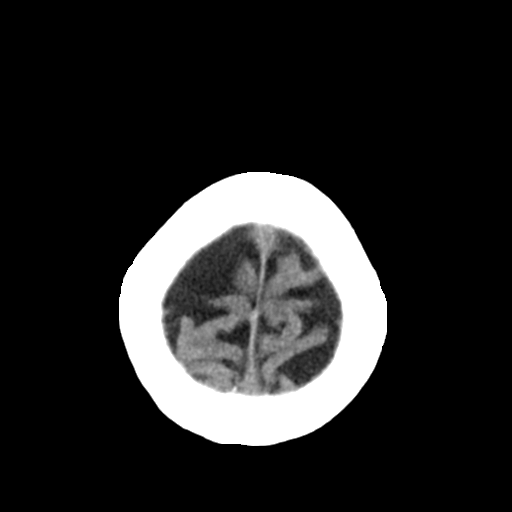
[im 28/31  brain]
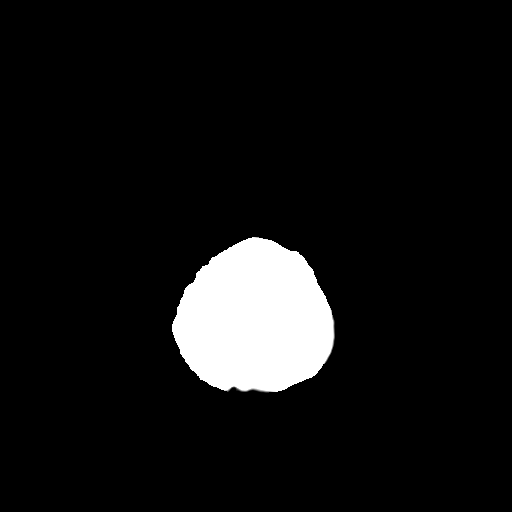
[im 28/31  bone]
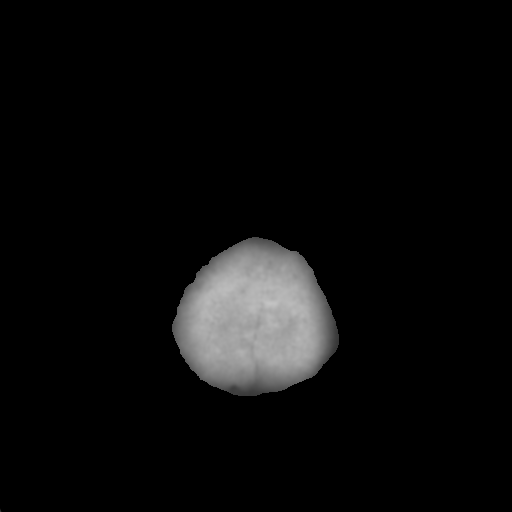

[Series 4: coronal soft tissue · coronal · 0.30mm/px · 3 of 64 slices shown]
[im 22/64  brain]
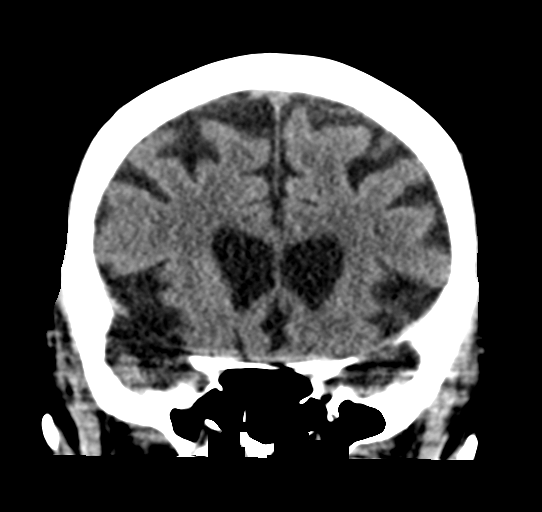
[im 29/64  brain]
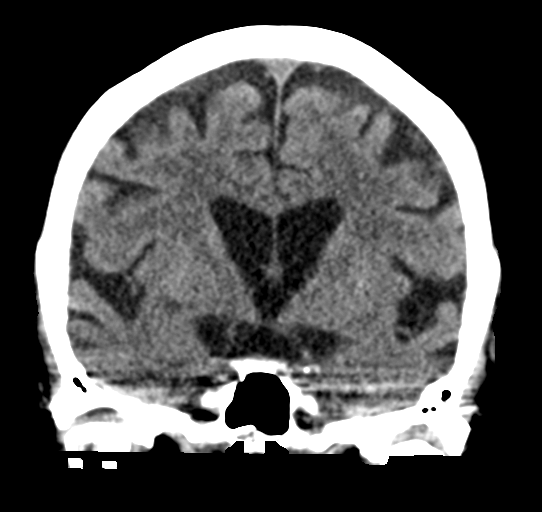
[im 36/64  brain]
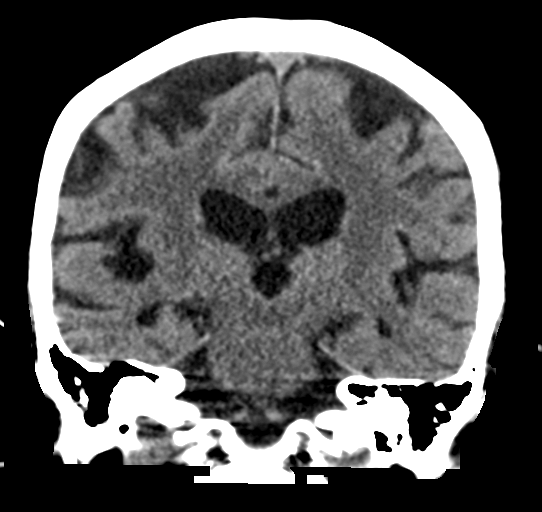

[Series 5: sagittal soft tissue · sagittal · 0.30mm/px · 3 of 52 slices shown]
[im 18/52  brain]
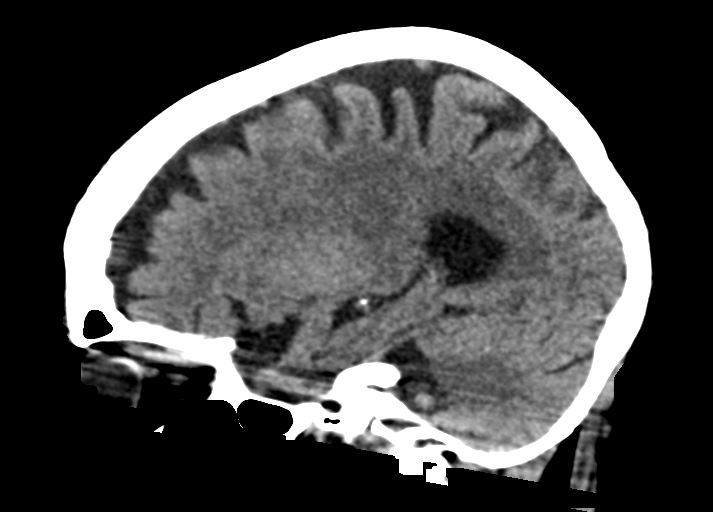
[im 26/52  brain]
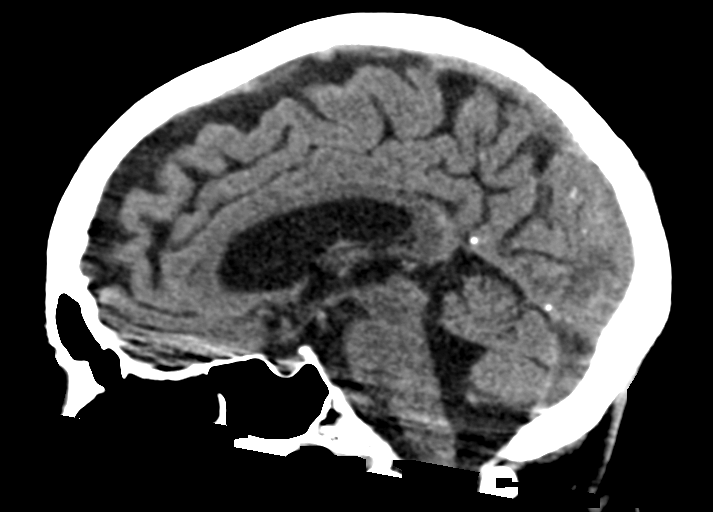
[im 35/52  brain]
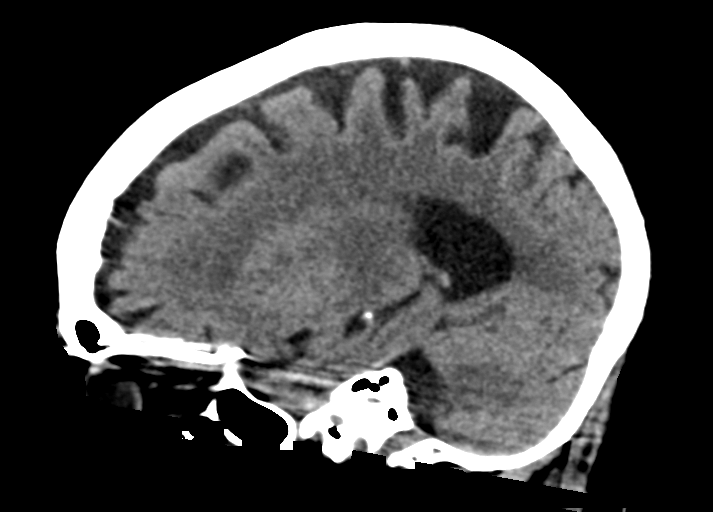

[15 of 47 positions shown; findings below may reference images not displayed]

FINDINGS: Brain: There is atrophy and chronic small vessel disease changes. No
acute intracranial abnormality. Specifically, no hemorrhage,
hydrocephalus, mass lesion, acute infarction, or significant
intracranial injury.

Vascular: No hyperdense vessel or unexpected calcification.

Skull: No acute calvarial abnormality.

Sinuses/Orbits: Visualized paranasal sinuses and mastoids clear.
Orbital soft tissues unremarkable.

Other: None
IMPRESSION: No acute intracranial abnormality.

Atrophy, chronic microvascular disease.

## 2018-09-27 DIAGNOSIS — Z6829 Body mass index (BMI) 29.0-29.9, adult: Secondary | ICD-10-CM | POA: Diagnosis not present

## 2018-09-27 DIAGNOSIS — I10 Essential (primary) hypertension: Secondary | ICD-10-CM | POA: Diagnosis not present

## 2018-09-27 DIAGNOSIS — I509 Heart failure, unspecified: Secondary | ICD-10-CM | POA: Diagnosis not present

## 2018-09-27 DIAGNOSIS — N186 End stage renal disease: Secondary | ICD-10-CM | POA: Diagnosis not present

## 2018-09-27 DIAGNOSIS — Z299 Encounter for prophylactic measures, unspecified: Secondary | ICD-10-CM | POA: Diagnosis not present

## 2018-09-27 DIAGNOSIS — J449 Chronic obstructive pulmonary disease, unspecified: Secondary | ICD-10-CM | POA: Diagnosis not present

## 2018-10-09 DIAGNOSIS — I739 Peripheral vascular disease, unspecified: Secondary | ICD-10-CM | POA: Diagnosis not present

## 2018-10-18 DIAGNOSIS — Z299 Encounter for prophylactic measures, unspecified: Secondary | ICD-10-CM | POA: Diagnosis not present

## 2018-10-18 DIAGNOSIS — J449 Chronic obstructive pulmonary disease, unspecified: Secondary | ICD-10-CM | POA: Diagnosis not present

## 2018-10-18 DIAGNOSIS — E1165 Type 2 diabetes mellitus with hyperglycemia: Secondary | ICD-10-CM | POA: Diagnosis not present

## 2018-10-18 DIAGNOSIS — D649 Anemia, unspecified: Secondary | ICD-10-CM | POA: Diagnosis not present

## 2018-10-18 DIAGNOSIS — Z6829 Body mass index (BMI) 29.0-29.9, adult: Secondary | ICD-10-CM | POA: Diagnosis not present

## 2018-10-25 DIAGNOSIS — I509 Heart failure, unspecified: Secondary | ICD-10-CM | POA: Diagnosis not present

## 2018-10-25 DIAGNOSIS — N186 End stage renal disease: Secondary | ICD-10-CM | POA: Diagnosis not present

## 2018-10-25 DIAGNOSIS — E1165 Type 2 diabetes mellitus with hyperglycemia: Secondary | ICD-10-CM | POA: Diagnosis not present

## 2018-10-25 DIAGNOSIS — Z299 Encounter for prophylactic measures, unspecified: Secondary | ICD-10-CM | POA: Diagnosis not present

## 2018-10-25 DIAGNOSIS — J449 Chronic obstructive pulmonary disease, unspecified: Secondary | ICD-10-CM | POA: Diagnosis not present

## 2018-10-27 IMAGING — DX DG CHEST 2V
2 series · 2 of 2 positions shown · non-contrast
Comparison: 08/24/2017 and 05/30/2014

CLINICAL DATA: Shortness of breath.

EXAM:
CHEST - 2 VIEW

[chest lat]
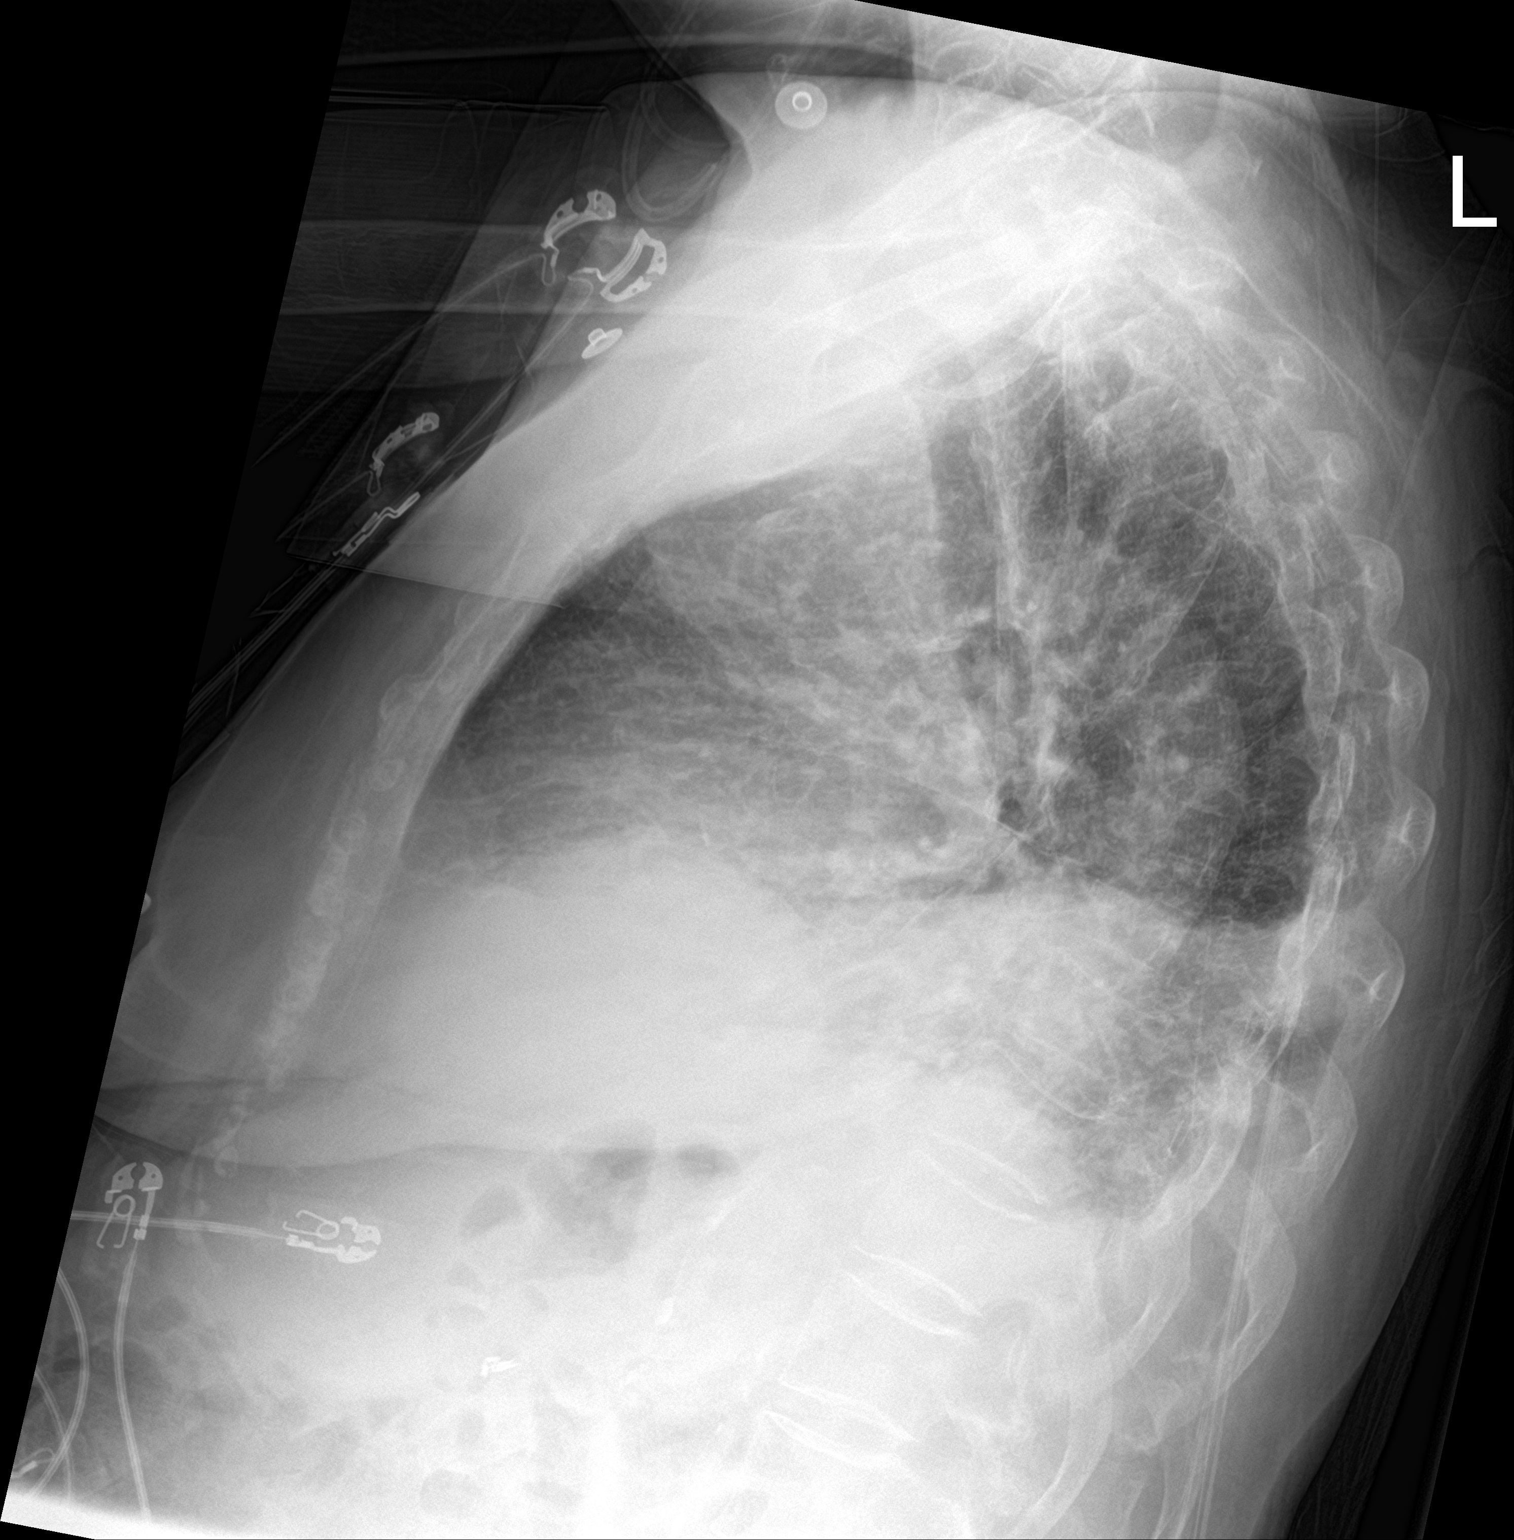

[chest ap]
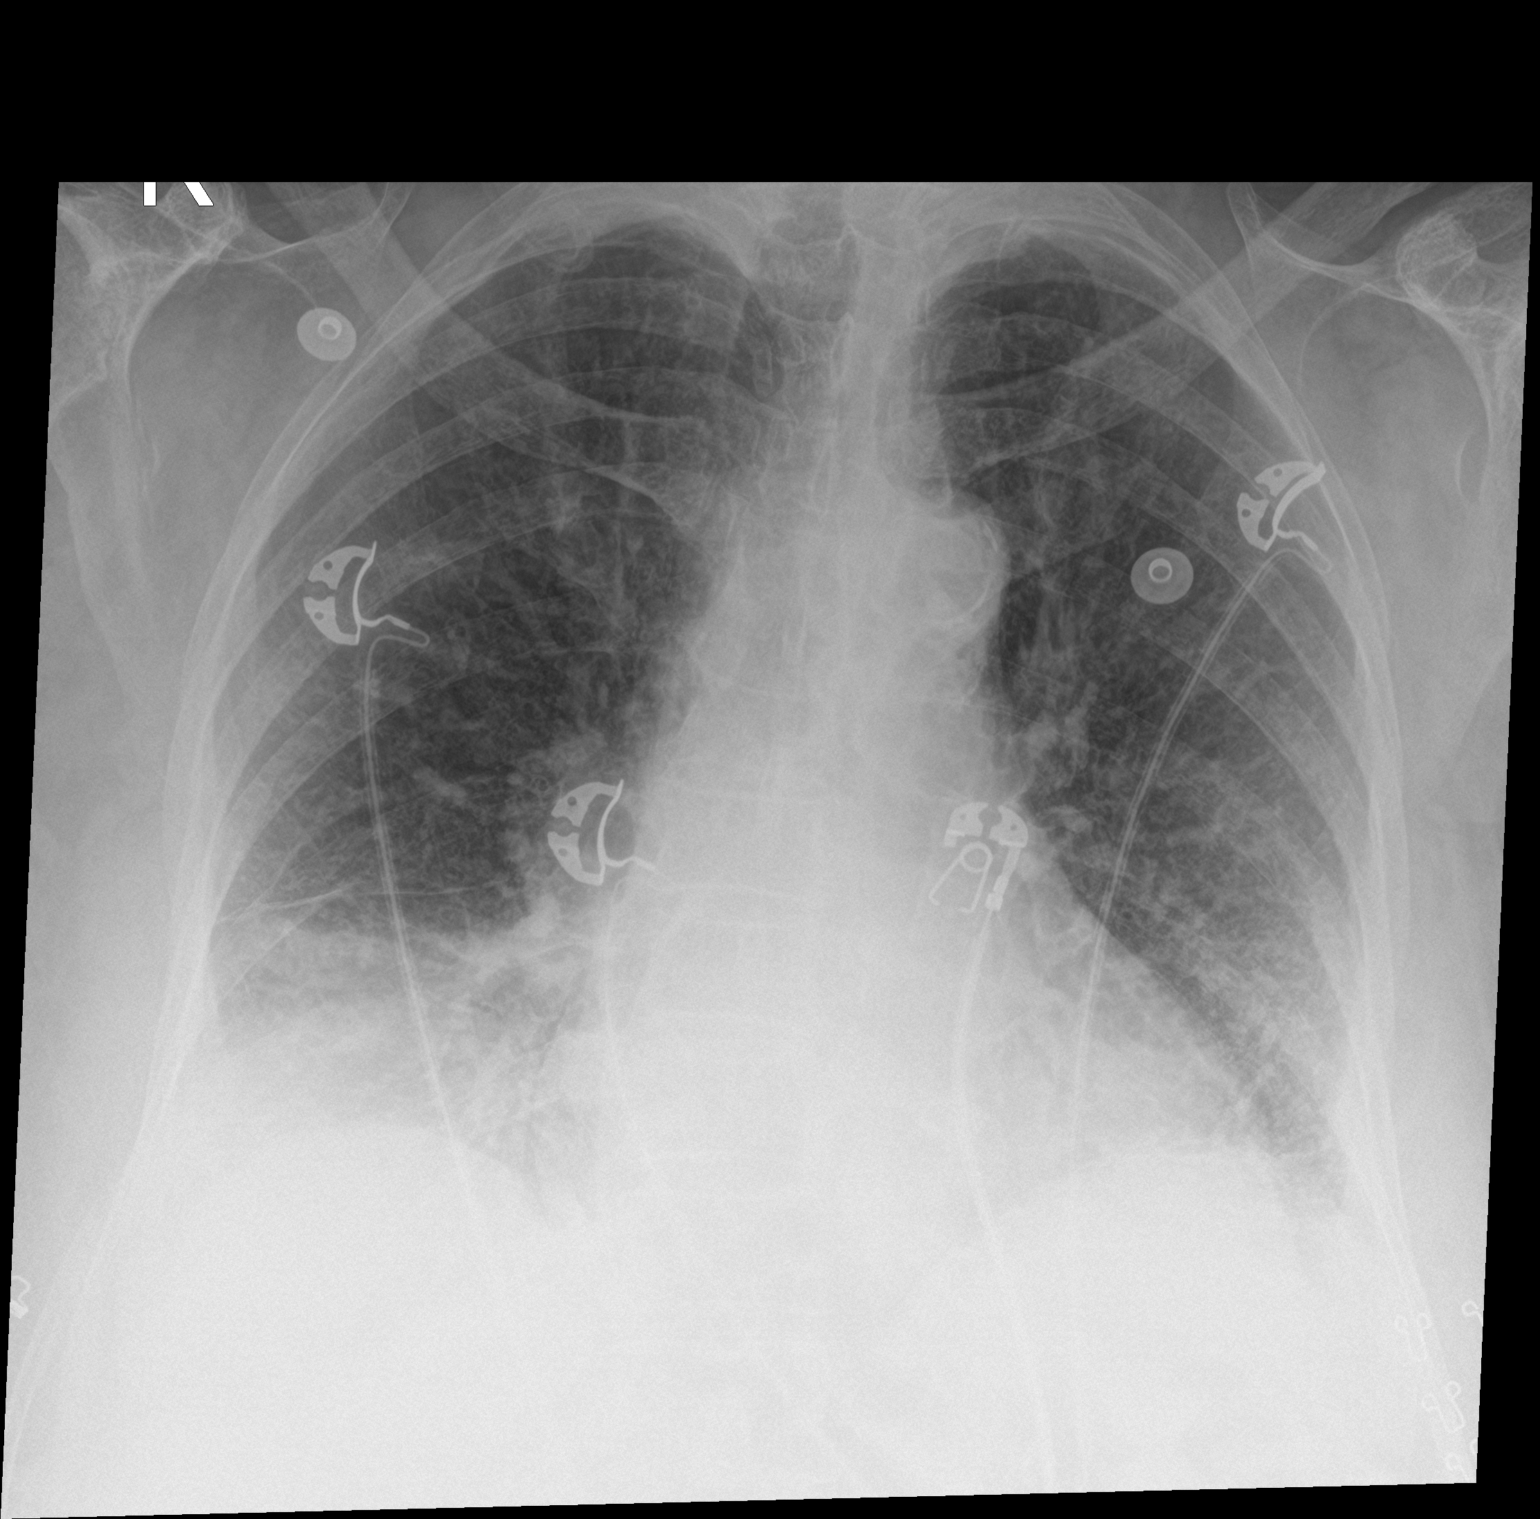

[2 of 2 positions shown; findings below may reference images not displayed]

FINDINGS: There is mild chronic cardiomegaly. Slight pulmonary vascular
congestion with mild bilateral interstitial pulmonary edema and
small effusions. Chronic elevation of the lateral aspect of the
right hemidiaphragm.

Aortic atherosclerosis. Chronic accentuation of the upper thoracic
kyphosis.
IMPRESSION: Findings consistent with mild congestive heart failure.

## 2018-11-01 DIAGNOSIS — R0981 Nasal congestion: Secondary | ICD-10-CM | POA: Diagnosis not present

## 2018-11-01 DIAGNOSIS — I509 Heart failure, unspecified: Secondary | ICD-10-CM | POA: Diagnosis not present

## 2018-11-01 DIAGNOSIS — I10 Essential (primary) hypertension: Secondary | ICD-10-CM | POA: Diagnosis not present

## 2018-11-01 DIAGNOSIS — Z6828 Body mass index (BMI) 28.0-28.9, adult: Secondary | ICD-10-CM | POA: Diagnosis not present

## 2018-11-01 DIAGNOSIS — Z299 Encounter for prophylactic measures, unspecified: Secondary | ICD-10-CM | POA: Diagnosis not present

## 2018-11-01 DIAGNOSIS — N186 End stage renal disease: Secondary | ICD-10-CM | POA: Diagnosis not present

## 2018-11-01 DIAGNOSIS — J449 Chronic obstructive pulmonary disease, unspecified: Secondary | ICD-10-CM | POA: Diagnosis not present

## 2018-11-08 DIAGNOSIS — Z20828 Contact with and (suspected) exposure to other viral communicable diseases: Secondary | ICD-10-CM | POA: Diagnosis not present

## 2018-11-29 DIAGNOSIS — M79672 Pain in left foot: Secondary | ICD-10-CM | POA: Diagnosis not present

## 2018-11-29 DIAGNOSIS — N186 End stage renal disease: Secondary | ICD-10-CM | POA: Diagnosis not present

## 2018-11-29 DIAGNOSIS — M79671 Pain in right foot: Secondary | ICD-10-CM | POA: Diagnosis not present

## 2018-11-29 DIAGNOSIS — J449 Chronic obstructive pulmonary disease, unspecified: Secondary | ICD-10-CM | POA: Diagnosis not present

## 2018-11-29 DIAGNOSIS — Z299 Encounter for prophylactic measures, unspecified: Secondary | ICD-10-CM | POA: Diagnosis not present

## 2018-12-06 DIAGNOSIS — E7800 Pure hypercholesterolemia, unspecified: Secondary | ICD-10-CM | POA: Diagnosis not present

## 2018-12-06 DIAGNOSIS — Z299 Encounter for prophylactic measures, unspecified: Secondary | ICD-10-CM | POA: Diagnosis not present

## 2018-12-06 DIAGNOSIS — R5383 Other fatigue: Secondary | ICD-10-CM | POA: Diagnosis not present

## 2018-12-06 DIAGNOSIS — Z0000 Encounter for general adult medical examination without abnormal findings: Secondary | ICD-10-CM | POA: Diagnosis not present

## 2018-12-06 DIAGNOSIS — Z7189 Other specified counseling: Secondary | ICD-10-CM | POA: Diagnosis not present

## 2018-12-06 DIAGNOSIS — Z1331 Encounter for screening for depression: Secondary | ICD-10-CM | POA: Diagnosis not present

## 2018-12-11 DIAGNOSIS — R5383 Other fatigue: Secondary | ICD-10-CM | POA: Diagnosis not present

## 2018-12-11 DIAGNOSIS — I739 Peripheral vascular disease, unspecified: Secondary | ICD-10-CM | POA: Diagnosis not present

## 2018-12-11 DIAGNOSIS — E7800 Pure hypercholesterolemia, unspecified: Secondary | ICD-10-CM | POA: Diagnosis not present

## 2018-12-11 DIAGNOSIS — Z79899 Other long term (current) drug therapy: Secondary | ICD-10-CM | POA: Diagnosis not present

## 2018-12-27 DIAGNOSIS — J449 Chronic obstructive pulmonary disease, unspecified: Secondary | ICD-10-CM | POA: Diagnosis not present

## 2018-12-27 DIAGNOSIS — N186 End stage renal disease: Secondary | ICD-10-CM | POA: Diagnosis not present

## 2018-12-27 DIAGNOSIS — Z299 Encounter for prophylactic measures, unspecified: Secondary | ICD-10-CM | POA: Diagnosis not present

## 2018-12-27 DIAGNOSIS — Z6828 Body mass index (BMI) 28.0-28.9, adult: Secondary | ICD-10-CM | POA: Diagnosis not present

## 2018-12-27 DIAGNOSIS — M2550 Pain in unspecified joint: Secondary | ICD-10-CM | POA: Diagnosis not present

## 2018-12-27 DIAGNOSIS — I509 Heart failure, unspecified: Secondary | ICD-10-CM | POA: Diagnosis not present

## 2018-12-27 DIAGNOSIS — I10 Essential (primary) hypertension: Secondary | ICD-10-CM | POA: Diagnosis not present

## 2019-02-26 DIAGNOSIS — Z23 Encounter for immunization: Secondary | ICD-10-CM | POA: Diagnosis not present

## 2019-03-05 DIAGNOSIS — I739 Peripheral vascular disease, unspecified: Secondary | ICD-10-CM | POA: Diagnosis not present

## 2019-03-15 DIAGNOSIS — F419 Anxiety disorder, unspecified: Secondary | ICD-10-CM | POA: Diagnosis not present

## 2019-03-15 DIAGNOSIS — E1165 Type 2 diabetes mellitus with hyperglycemia: Secondary | ICD-10-CM | POA: Diagnosis not present

## 2019-03-15 DIAGNOSIS — J449 Chronic obstructive pulmonary disease, unspecified: Secondary | ICD-10-CM | POA: Diagnosis not present

## 2019-03-15 DIAGNOSIS — Z299 Encounter for prophylactic measures, unspecified: Secondary | ICD-10-CM | POA: Diagnosis not present

## 2019-03-15 DIAGNOSIS — I10 Essential (primary) hypertension: Secondary | ICD-10-CM | POA: Diagnosis not present

## 2019-03-15 DIAGNOSIS — Z6828 Body mass index (BMI) 28.0-28.9, adult: Secondary | ICD-10-CM | POA: Diagnosis not present

## 2019-03-15 DIAGNOSIS — N186 End stage renal disease: Secondary | ICD-10-CM | POA: Diagnosis not present

## 2019-03-26 DIAGNOSIS — Z23 Encounter for immunization: Secondary | ICD-10-CM | POA: Diagnosis not present

## 2019-04-05 DIAGNOSIS — J449 Chronic obstructive pulmonary disease, unspecified: Secondary | ICD-10-CM | POA: Diagnosis not present

## 2019-04-05 DIAGNOSIS — I10 Essential (primary) hypertension: Secondary | ICD-10-CM | POA: Diagnosis not present

## 2019-04-05 DIAGNOSIS — F419 Anxiety disorder, unspecified: Secondary | ICD-10-CM | POA: Diagnosis not present

## 2019-04-05 DIAGNOSIS — Z299 Encounter for prophylactic measures, unspecified: Secondary | ICD-10-CM | POA: Diagnosis not present

## 2019-04-05 DIAGNOSIS — Z6828 Body mass index (BMI) 28.0-28.9, adult: Secondary | ICD-10-CM | POA: Diagnosis not present

## 2019-04-05 DIAGNOSIS — E1165 Type 2 diabetes mellitus with hyperglycemia: Secondary | ICD-10-CM | POA: Diagnosis not present

## 2019-04-19 DIAGNOSIS — N186 End stage renal disease: Secondary | ICD-10-CM | POA: Diagnosis not present

## 2019-04-19 DIAGNOSIS — J309 Allergic rhinitis, unspecified: Secondary | ICD-10-CM | POA: Diagnosis not present

## 2019-04-19 DIAGNOSIS — Z6829 Body mass index (BMI) 29.0-29.9, adult: Secondary | ICD-10-CM | POA: Diagnosis not present

## 2019-04-19 DIAGNOSIS — Z299 Encounter for prophylactic measures, unspecified: Secondary | ICD-10-CM | POA: Diagnosis not present

## 2019-04-19 DIAGNOSIS — I509 Heart failure, unspecified: Secondary | ICD-10-CM | POA: Diagnosis not present

## 2019-04-19 DIAGNOSIS — J449 Chronic obstructive pulmonary disease, unspecified: Secondary | ICD-10-CM | POA: Diagnosis not present

## 2019-04-19 DIAGNOSIS — I10 Essential (primary) hypertension: Secondary | ICD-10-CM | POA: Diagnosis not present

## 2019-05-14 DIAGNOSIS — I739 Peripheral vascular disease, unspecified: Secondary | ICD-10-CM | POA: Diagnosis not present

## 2019-05-28 DIAGNOSIS — E1165 Type 2 diabetes mellitus with hyperglycemia: Secondary | ICD-10-CM | POA: Diagnosis not present

## 2019-05-31 DIAGNOSIS — J9611 Chronic respiratory failure with hypoxia: Secondary | ICD-10-CM | POA: Diagnosis not present

## 2019-05-31 DIAGNOSIS — N186 End stage renal disease: Secondary | ICD-10-CM | POA: Diagnosis not present

## 2019-05-31 DIAGNOSIS — E1165 Type 2 diabetes mellitus with hyperglycemia: Secondary | ICD-10-CM | POA: Diagnosis not present

## 2019-05-31 DIAGNOSIS — Z299 Encounter for prophylactic measures, unspecified: Secondary | ICD-10-CM | POA: Diagnosis not present

## 2019-05-31 DIAGNOSIS — J449 Chronic obstructive pulmonary disease, unspecified: Secondary | ICD-10-CM | POA: Diagnosis not present

## 2019-08-02 DIAGNOSIS — E1122 Type 2 diabetes mellitus with diabetic chronic kidney disease: Secondary | ICD-10-CM | POA: Diagnosis not present

## 2019-08-02 DIAGNOSIS — Z299 Encounter for prophylactic measures, unspecified: Secondary | ICD-10-CM | POA: Diagnosis not present

## 2019-08-02 DIAGNOSIS — N183 Chronic kidney disease, stage 3 unspecified: Secondary | ICD-10-CM | POA: Diagnosis not present

## 2019-08-02 DIAGNOSIS — I10 Essential (primary) hypertension: Secondary | ICD-10-CM | POA: Diagnosis not present

## 2019-08-02 DIAGNOSIS — E1165 Type 2 diabetes mellitus with hyperglycemia: Secondary | ICD-10-CM | POA: Diagnosis not present

## 2019-08-02 DIAGNOSIS — J449 Chronic obstructive pulmonary disease, unspecified: Secondary | ICD-10-CM | POA: Diagnosis not present

## 2019-08-06 DIAGNOSIS — I739 Peripheral vascular disease, unspecified: Secondary | ICD-10-CM | POA: Diagnosis not present

## 2019-09-13 DIAGNOSIS — Z299 Encounter for prophylactic measures, unspecified: Secondary | ICD-10-CM | POA: Diagnosis not present

## 2019-09-13 DIAGNOSIS — I10 Essential (primary) hypertension: Secondary | ICD-10-CM | POA: Diagnosis not present

## 2019-09-13 DIAGNOSIS — F419 Anxiety disorder, unspecified: Secondary | ICD-10-CM | POA: Diagnosis not present

## 2019-09-13 DIAGNOSIS — N183 Chronic kidney disease, stage 3 unspecified: Secondary | ICD-10-CM | POA: Diagnosis not present

## 2019-09-13 DIAGNOSIS — E1165 Type 2 diabetes mellitus with hyperglycemia: Secondary | ICD-10-CM | POA: Diagnosis not present

## 2019-09-18 IMAGING — DX CHEST - 2 VIEW
2 series · 2 of 2 positions shown · non-contrast
Comparison: 12/06/2017

CLINICAL DATA: Leg swelling

EXAM:
CHEST - 2 VIEW

[chest lat]
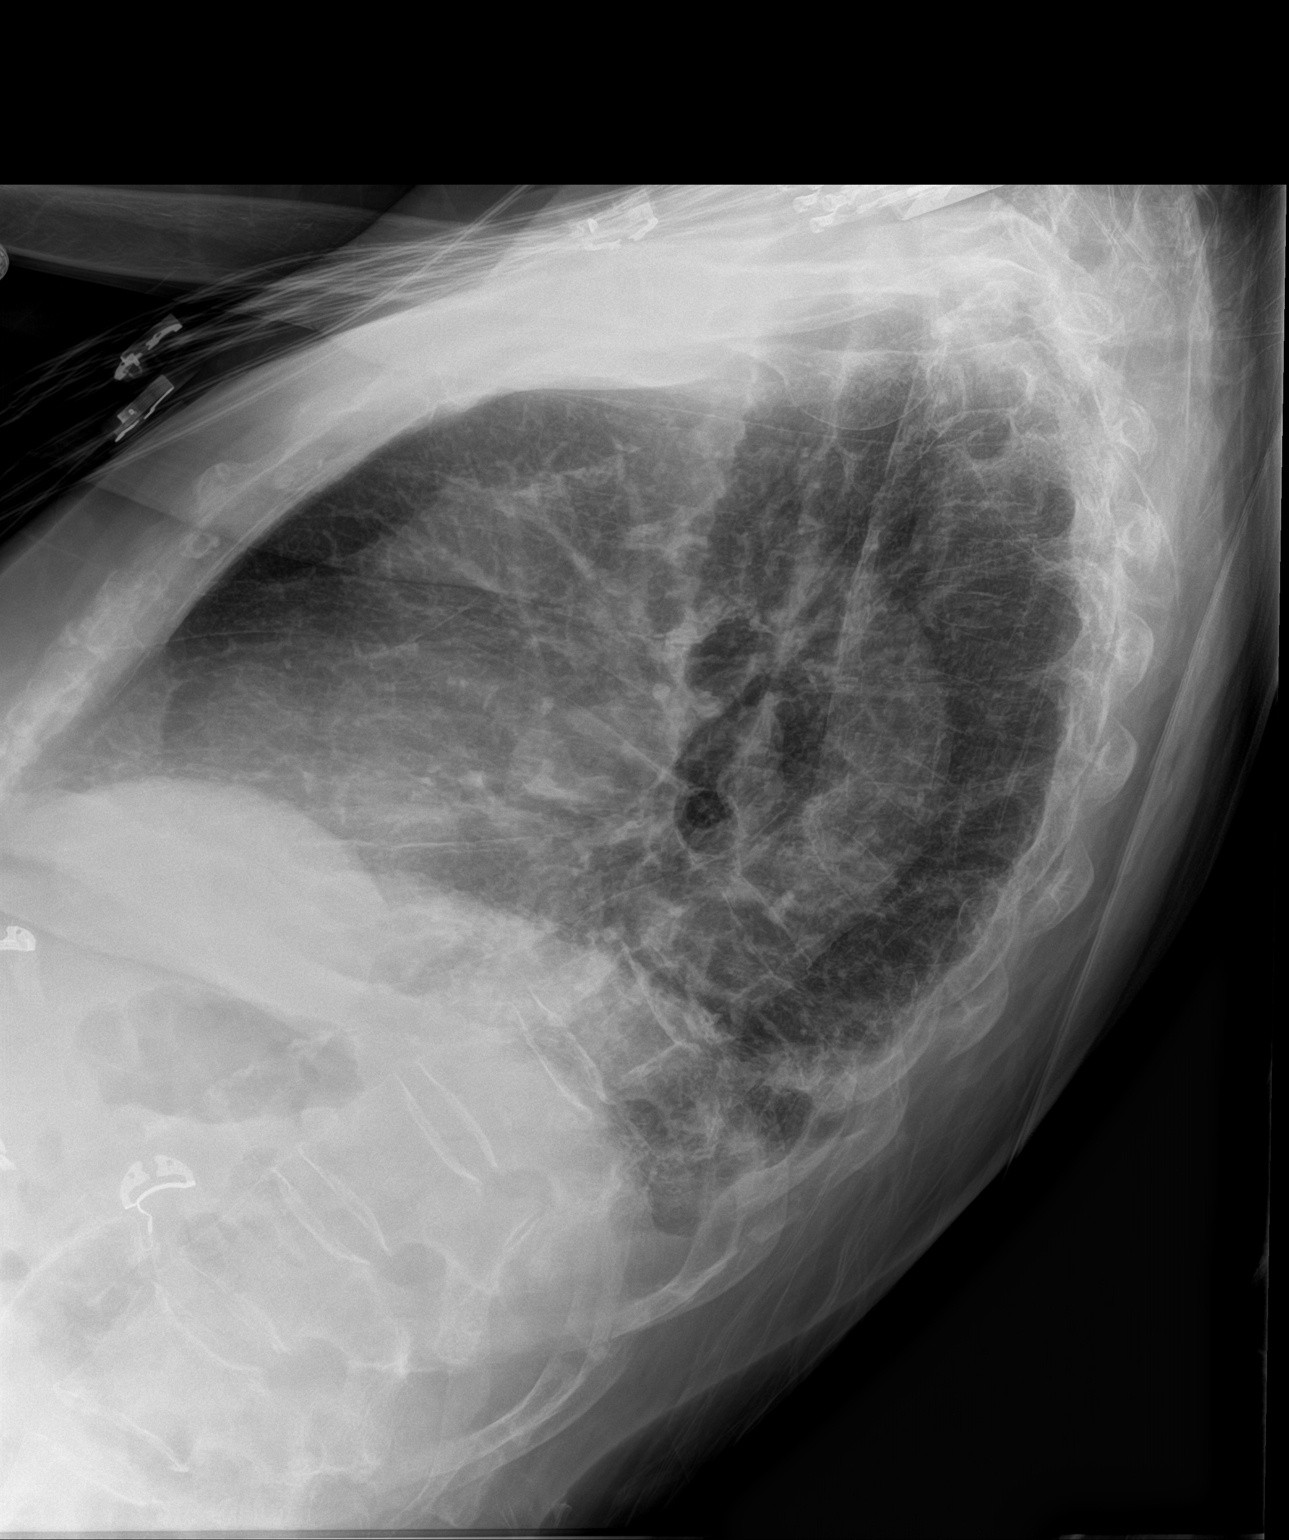

[chest ap]
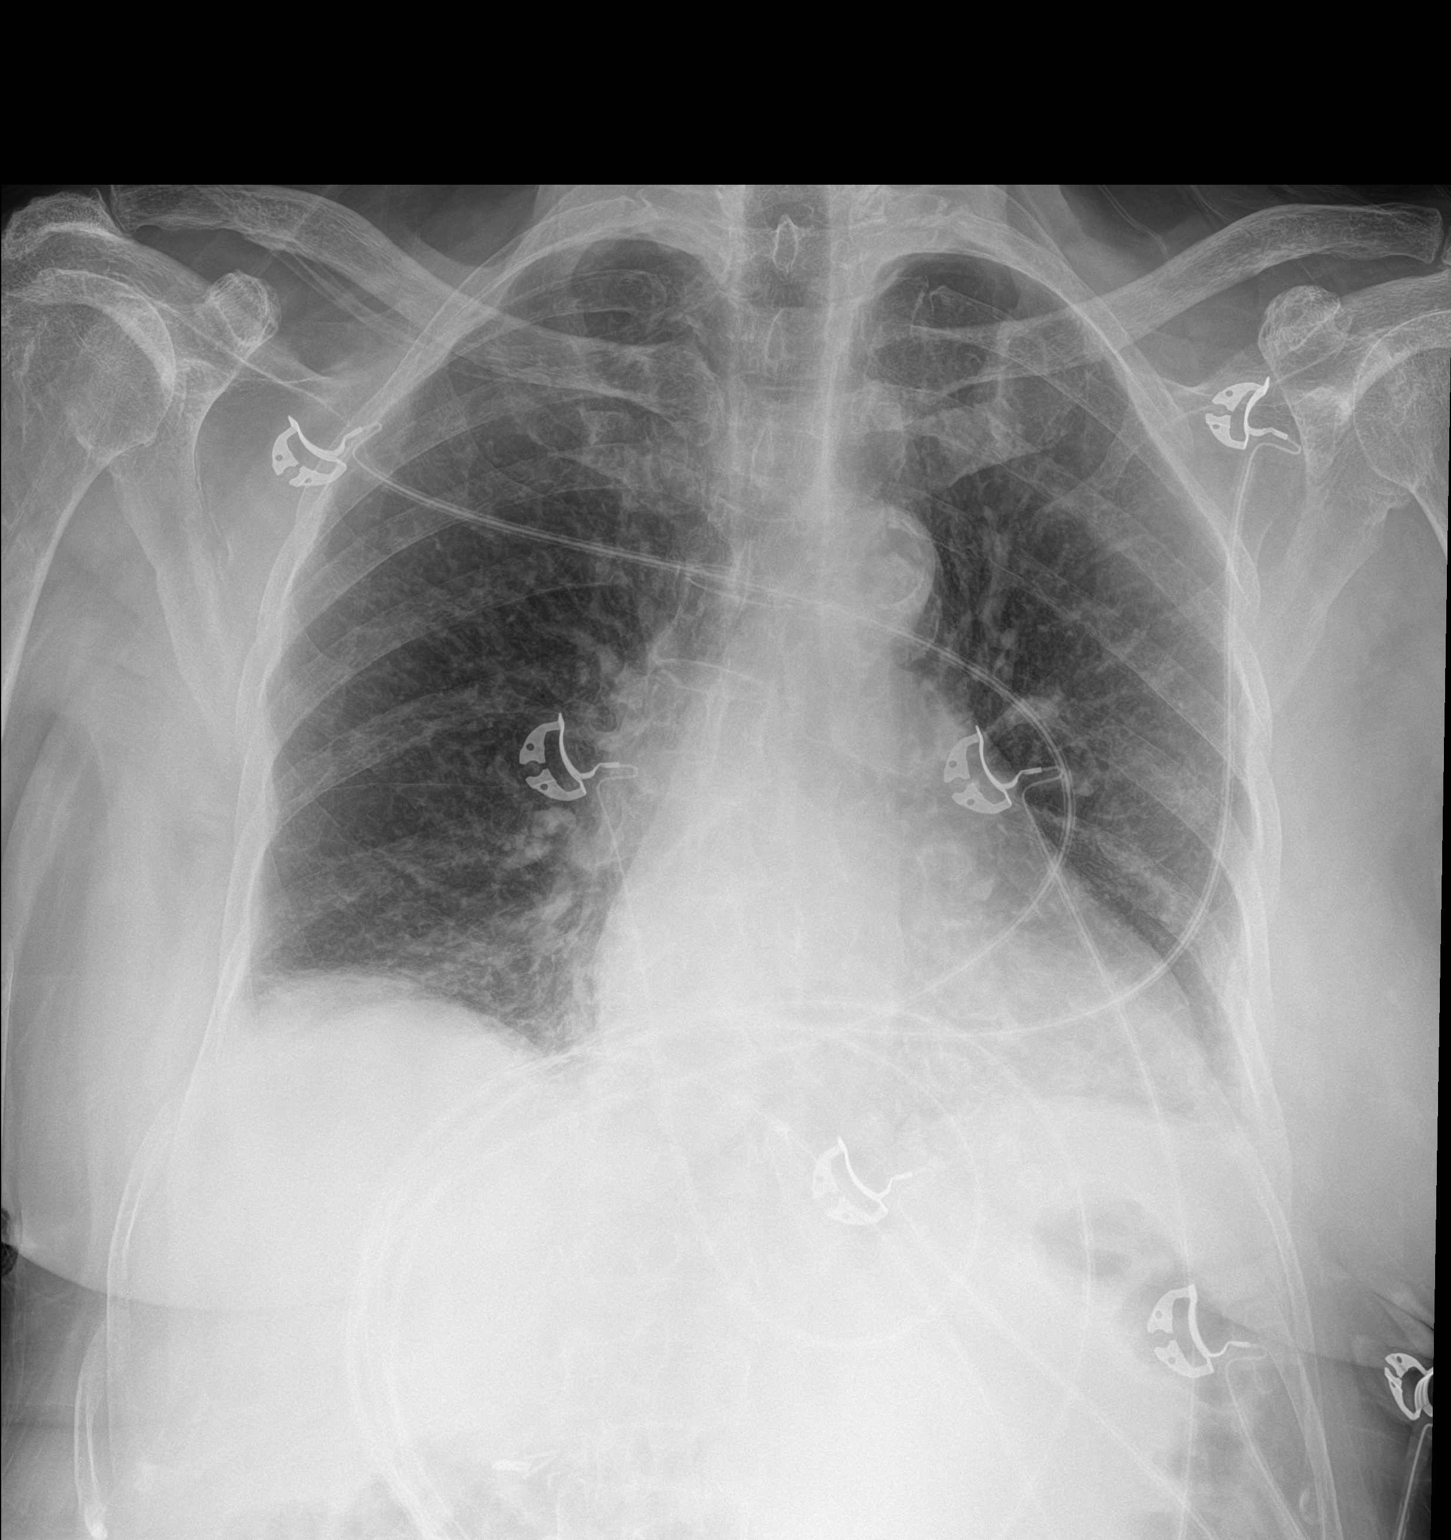

[2 of 2 positions shown; findings below may reference images not displayed]

FINDINGS: Upper normal heart size.

Mediastinal contours and pulmonary vascularity normal.

Atherosclerotic calcification aorta.

Mild bronchitic changes with bibasilar atelectasis.

Remaining lungs clear.

No pleural effusion or pneumothorax.

Bones demineralized.
IMPRESSION: Bronchitic changes with bibasilar atelectasis.

## 2019-10-18 DIAGNOSIS — I10 Essential (primary) hypertension: Secondary | ICD-10-CM | POA: Diagnosis not present

## 2019-10-18 DIAGNOSIS — E1122 Type 2 diabetes mellitus with diabetic chronic kidney disease: Secondary | ICD-10-CM | POA: Diagnosis not present

## 2019-10-18 DIAGNOSIS — E1165 Type 2 diabetes mellitus with hyperglycemia: Secondary | ICD-10-CM | POA: Diagnosis not present

## 2019-10-18 DIAGNOSIS — Z299 Encounter for prophylactic measures, unspecified: Secondary | ICD-10-CM | POA: Diagnosis not present

## 2019-10-18 DIAGNOSIS — R220 Localized swelling, mass and lump, head: Secondary | ICD-10-CM | POA: Diagnosis not present

## 2019-10-22 DIAGNOSIS — I739 Peripheral vascular disease, unspecified: Secondary | ICD-10-CM | POA: Diagnosis not present

## 2019-10-31 DIAGNOSIS — Z299 Encounter for prophylactic measures, unspecified: Secondary | ICD-10-CM | POA: Diagnosis not present

## 2019-10-31 DIAGNOSIS — J449 Chronic obstructive pulmonary disease, unspecified: Secondary | ICD-10-CM | POA: Diagnosis not present

## 2019-10-31 DIAGNOSIS — E1165 Type 2 diabetes mellitus with hyperglycemia: Secondary | ICD-10-CM | POA: Diagnosis not present

## 2019-10-31 DIAGNOSIS — I10 Essential (primary) hypertension: Secondary | ICD-10-CM | POA: Diagnosis not present

## 2019-10-31 DIAGNOSIS — E1122 Type 2 diabetes mellitus with diabetic chronic kidney disease: Secondary | ICD-10-CM | POA: Diagnosis not present

## 2019-10-31 DIAGNOSIS — N186 End stage renal disease: Secondary | ICD-10-CM | POA: Diagnosis not present

## 2019-11-13 DIAGNOSIS — Z23 Encounter for immunization: Secondary | ICD-10-CM | POA: Diagnosis not present

## 2019-12-13 DIAGNOSIS — Z0000 Encounter for general adult medical examination without abnormal findings: Secondary | ICD-10-CM | POA: Diagnosis not present

## 2019-12-13 DIAGNOSIS — Z7189 Other specified counseling: Secondary | ICD-10-CM | POA: Diagnosis not present

## 2019-12-13 DIAGNOSIS — Z6828 Body mass index (BMI) 28.0-28.9, adult: Secondary | ICD-10-CM | POA: Diagnosis not present

## 2019-12-13 DIAGNOSIS — Z299 Encounter for prophylactic measures, unspecified: Secondary | ICD-10-CM | POA: Diagnosis not present

## 2019-12-20 DIAGNOSIS — I509 Heart failure, unspecified: Secondary | ICD-10-CM | POA: Diagnosis not present

## 2019-12-20 DIAGNOSIS — Z299 Encounter for prophylactic measures, unspecified: Secondary | ICD-10-CM | POA: Diagnosis not present

## 2019-12-20 DIAGNOSIS — E1122 Type 2 diabetes mellitus with diabetic chronic kidney disease: Secondary | ICD-10-CM | POA: Diagnosis not present

## 2019-12-20 DIAGNOSIS — I10 Essential (primary) hypertension: Secondary | ICD-10-CM | POA: Diagnosis not present

## 2019-12-20 DIAGNOSIS — E1165 Type 2 diabetes mellitus with hyperglycemia: Secondary | ICD-10-CM | POA: Diagnosis not present

## 2019-12-27 DIAGNOSIS — Z299 Encounter for prophylactic measures, unspecified: Secondary | ICD-10-CM | POA: Diagnosis not present

## 2019-12-27 DIAGNOSIS — E1122 Type 2 diabetes mellitus with diabetic chronic kidney disease: Secondary | ICD-10-CM | POA: Diagnosis not present

## 2019-12-27 DIAGNOSIS — I10 Essential (primary) hypertension: Secondary | ICD-10-CM | POA: Diagnosis not present

## 2019-12-27 DIAGNOSIS — I509 Heart failure, unspecified: Secondary | ICD-10-CM | POA: Diagnosis not present

## 2019-12-27 DIAGNOSIS — D171 Benign lipomatous neoplasm of skin and subcutaneous tissue of trunk: Secondary | ICD-10-CM | POA: Diagnosis not present

## 2020-01-04 DIAGNOSIS — Z23 Encounter for immunization: Secondary | ICD-10-CM | POA: Diagnosis not present

## 2020-01-07 DIAGNOSIS — I739 Peripheral vascular disease, unspecified: Secondary | ICD-10-CM | POA: Diagnosis not present

## 2020-01-14 DIAGNOSIS — M25552 Pain in left hip: Secondary | ICD-10-CM | POA: Diagnosis not present

## 2020-01-14 DIAGNOSIS — E1165 Type 2 diabetes mellitus with hyperglycemia: Secondary | ICD-10-CM | POA: Diagnosis not present

## 2020-01-14 DIAGNOSIS — Z299 Encounter for prophylactic measures, unspecified: Secondary | ICD-10-CM | POA: Diagnosis not present

## 2020-01-14 DIAGNOSIS — I10 Essential (primary) hypertension: Secondary | ICD-10-CM | POA: Diagnosis not present

## 2020-01-14 DIAGNOSIS — J449 Chronic obstructive pulmonary disease, unspecified: Secondary | ICD-10-CM | POA: Diagnosis not present

## 2020-02-06 DIAGNOSIS — I10 Essential (primary) hypertension: Secondary | ICD-10-CM | POA: Diagnosis not present

## 2020-02-06 DIAGNOSIS — J449 Chronic obstructive pulmonary disease, unspecified: Secondary | ICD-10-CM | POA: Diagnosis not present

## 2020-02-06 DIAGNOSIS — Z299 Encounter for prophylactic measures, unspecified: Secondary | ICD-10-CM | POA: Diagnosis not present

## 2020-02-06 DIAGNOSIS — J069 Acute upper respiratory infection, unspecified: Secondary | ICD-10-CM | POA: Diagnosis not present

## 2020-02-06 DIAGNOSIS — E1165 Type 2 diabetes mellitus with hyperglycemia: Secondary | ICD-10-CM | POA: Diagnosis not present

## 2020-02-07 DIAGNOSIS — E1122 Type 2 diabetes mellitus with diabetic chronic kidney disease: Secondary | ICD-10-CM | POA: Diagnosis not present

## 2020-02-07 DIAGNOSIS — Z299 Encounter for prophylactic measures, unspecified: Secondary | ICD-10-CM | POA: Diagnosis not present

## 2020-02-07 DIAGNOSIS — I10 Essential (primary) hypertension: Secondary | ICD-10-CM | POA: Diagnosis not present

## 2020-02-07 DIAGNOSIS — E1165 Type 2 diabetes mellitus with hyperglycemia: Secondary | ICD-10-CM | POA: Diagnosis not present

## 2020-02-07 DIAGNOSIS — J069 Acute upper respiratory infection, unspecified: Secondary | ICD-10-CM | POA: Diagnosis not present

## 2020-02-12 DIAGNOSIS — Z299 Encounter for prophylactic measures, unspecified: Secondary | ICD-10-CM | POA: Diagnosis not present

## 2020-02-12 DIAGNOSIS — J40 Bronchitis, not specified as acute or chronic: Secondary | ICD-10-CM | POA: Diagnosis not present

## 2020-02-12 DIAGNOSIS — I10 Essential (primary) hypertension: Secondary | ICD-10-CM | POA: Diagnosis not present

## 2020-02-12 DIAGNOSIS — R32 Unspecified urinary incontinence: Secondary | ICD-10-CM | POA: Diagnosis not present

## 2020-02-12 DIAGNOSIS — J449 Chronic obstructive pulmonary disease, unspecified: Secondary | ICD-10-CM | POA: Diagnosis not present

## 2020-02-12 DIAGNOSIS — E1165 Type 2 diabetes mellitus with hyperglycemia: Secondary | ICD-10-CM | POA: Diagnosis not present

## 2020-03-14 DIAGNOSIS — E1165 Type 2 diabetes mellitus with hyperglycemia: Secondary | ICD-10-CM | POA: Diagnosis not present

## 2020-03-14 DIAGNOSIS — F419 Anxiety disorder, unspecified: Secondary | ICD-10-CM | POA: Diagnosis not present

## 2020-03-14 DIAGNOSIS — J449 Chronic obstructive pulmonary disease, unspecified: Secondary | ICD-10-CM | POA: Diagnosis not present

## 2020-03-14 DIAGNOSIS — N186 End stage renal disease: Secondary | ICD-10-CM | POA: Diagnosis not present

## 2020-03-14 DIAGNOSIS — Z299 Encounter for prophylactic measures, unspecified: Secondary | ICD-10-CM | POA: Diagnosis not present

## 2020-03-21 DIAGNOSIS — J449 Chronic obstructive pulmonary disease, unspecified: Secondary | ICD-10-CM | POA: Diagnosis not present

## 2020-03-21 DIAGNOSIS — H6690 Otitis media, unspecified, unspecified ear: Secondary | ICD-10-CM | POA: Diagnosis not present

## 2020-03-21 DIAGNOSIS — Z789 Other specified health status: Secondary | ICD-10-CM | POA: Diagnosis not present

## 2020-03-21 DIAGNOSIS — I10 Essential (primary) hypertension: Secondary | ICD-10-CM | POA: Diagnosis not present

## 2020-03-21 DIAGNOSIS — E1165 Type 2 diabetes mellitus with hyperglycemia: Secondary | ICD-10-CM | POA: Diagnosis not present

## 2020-03-21 DIAGNOSIS — Z299 Encounter for prophylactic measures, unspecified: Secondary | ICD-10-CM | POA: Diagnosis not present

## 2020-03-21 DIAGNOSIS — N186 End stage renal disease: Secondary | ICD-10-CM | POA: Diagnosis not present

## 2020-03-24 DIAGNOSIS — I739 Peripheral vascular disease, unspecified: Secondary | ICD-10-CM | POA: Diagnosis not present

## 2020-04-03 DIAGNOSIS — I10 Essential (primary) hypertension: Secondary | ICD-10-CM | POA: Diagnosis not present

## 2020-04-03 DIAGNOSIS — Z299 Encounter for prophylactic measures, unspecified: Secondary | ICD-10-CM | POA: Diagnosis not present

## 2020-04-03 DIAGNOSIS — E1165 Type 2 diabetes mellitus with hyperglycemia: Secondary | ICD-10-CM | POA: Diagnosis not present

## 2020-04-03 DIAGNOSIS — R141 Gas pain: Secondary | ICD-10-CM | POA: Diagnosis not present

## 2020-04-03 DIAGNOSIS — I509 Heart failure, unspecified: Secondary | ICD-10-CM | POA: Diagnosis not present

## 2020-04-03 DIAGNOSIS — E1122 Type 2 diabetes mellitus with diabetic chronic kidney disease: Secondary | ICD-10-CM | POA: Diagnosis not present

## 2020-04-21 DIAGNOSIS — I509 Heart failure, unspecified: Secondary | ICD-10-CM | POA: Diagnosis not present

## 2020-04-21 DIAGNOSIS — N186 End stage renal disease: Secondary | ICD-10-CM | POA: Diagnosis not present

## 2020-04-21 DIAGNOSIS — E1122 Type 2 diabetes mellitus with diabetic chronic kidney disease: Secondary | ICD-10-CM | POA: Diagnosis not present

## 2020-04-21 DIAGNOSIS — Z789 Other specified health status: Secondary | ICD-10-CM | POA: Diagnosis not present

## 2020-04-21 DIAGNOSIS — I10 Essential (primary) hypertension: Secondary | ICD-10-CM | POA: Diagnosis not present

## 2020-04-21 DIAGNOSIS — E1165 Type 2 diabetes mellitus with hyperglycemia: Secondary | ICD-10-CM | POA: Diagnosis not present

## 2020-04-21 DIAGNOSIS — Z299 Encounter for prophylactic measures, unspecified: Secondary | ICD-10-CM | POA: Diagnosis not present

## 2020-05-15 DIAGNOSIS — N186 End stage renal disease: Secondary | ICD-10-CM | POA: Diagnosis not present

## 2020-05-15 DIAGNOSIS — Z299 Encounter for prophylactic measures, unspecified: Secondary | ICD-10-CM | POA: Diagnosis not present

## 2020-05-15 DIAGNOSIS — L309 Dermatitis, unspecified: Secondary | ICD-10-CM | POA: Diagnosis not present

## 2020-05-15 DIAGNOSIS — I509 Heart failure, unspecified: Secondary | ICD-10-CM | POA: Diagnosis not present

## 2020-05-15 DIAGNOSIS — J449 Chronic obstructive pulmonary disease, unspecified: Secondary | ICD-10-CM | POA: Diagnosis not present

## 2020-05-23 DIAGNOSIS — R32 Unspecified urinary incontinence: Secondary | ICD-10-CM | POA: Diagnosis not present

## 2020-05-23 DIAGNOSIS — J449 Chronic obstructive pulmonary disease, unspecified: Secondary | ICD-10-CM | POA: Diagnosis not present

## 2020-05-23 DIAGNOSIS — R1084 Generalized abdominal pain: Secondary | ICD-10-CM | POA: Diagnosis not present

## 2020-05-23 DIAGNOSIS — R109 Unspecified abdominal pain: Secondary | ICD-10-CM | POA: Diagnosis not present

## 2020-05-23 DIAGNOSIS — R0602 Shortness of breath: Secondary | ICD-10-CM | POA: Diagnosis not present

## 2020-05-23 DIAGNOSIS — K449 Diaphragmatic hernia without obstruction or gangrene: Secondary | ICD-10-CM | POA: Diagnosis not present

## 2020-05-23 DIAGNOSIS — I110 Hypertensive heart disease with heart failure: Secondary | ICD-10-CM | POA: Diagnosis not present

## 2020-05-23 DIAGNOSIS — K5730 Diverticulosis of large intestine without perforation or abscess without bleeding: Secondary | ICD-10-CM | POA: Diagnosis not present

## 2020-05-23 DIAGNOSIS — I700 Atherosclerosis of aorta: Secondary | ICD-10-CM | POA: Diagnosis not present

## 2020-05-23 DIAGNOSIS — I350 Nonrheumatic aortic (valve) stenosis: Secondary | ICD-10-CM | POA: Diagnosis not present

## 2020-05-23 DIAGNOSIS — I2510 Atherosclerotic heart disease of native coronary artery without angina pectoris: Secondary | ICD-10-CM | POA: Diagnosis not present

## 2020-05-23 DIAGNOSIS — N281 Cyst of kidney, acquired: Secondary | ICD-10-CM | POA: Diagnosis not present

## 2020-05-23 DIAGNOSIS — K429 Umbilical hernia without obstruction or gangrene: Secondary | ICD-10-CM | POA: Diagnosis not present

## 2020-05-23 DIAGNOSIS — K6289 Other specified diseases of anus and rectum: Secondary | ICD-10-CM | POA: Diagnosis not present

## 2020-05-23 DIAGNOSIS — R350 Frequency of micturition: Secondary | ICD-10-CM | POA: Diagnosis not present

## 2020-05-23 DIAGNOSIS — I509 Heart failure, unspecified: Secondary | ICD-10-CM | POA: Diagnosis not present

## 2020-05-24 DIAGNOSIS — I959 Hypotension, unspecified: Secondary | ICD-10-CM | POA: Diagnosis not present

## 2020-05-24 DIAGNOSIS — Z7401 Bed confinement status: Secondary | ICD-10-CM | POA: Diagnosis not present

## 2020-05-29 DIAGNOSIS — Z299 Encounter for prophylactic measures, unspecified: Secondary | ICD-10-CM | POA: Diagnosis not present

## 2020-05-29 DIAGNOSIS — E1122 Type 2 diabetes mellitus with diabetic chronic kidney disease: Secondary | ICD-10-CM | POA: Diagnosis not present

## 2020-05-29 DIAGNOSIS — I10 Essential (primary) hypertension: Secondary | ICD-10-CM | POA: Diagnosis not present

## 2020-05-29 DIAGNOSIS — N186 End stage renal disease: Secondary | ICD-10-CM | POA: Diagnosis not present

## 2020-05-29 DIAGNOSIS — E1165 Type 2 diabetes mellitus with hyperglycemia: Secondary | ICD-10-CM | POA: Diagnosis not present

## 2020-06-09 DIAGNOSIS — I739 Peripheral vascular disease, unspecified: Secondary | ICD-10-CM | POA: Diagnosis not present

## 2020-06-12 DIAGNOSIS — E1165 Type 2 diabetes mellitus with hyperglycemia: Secondary | ICD-10-CM | POA: Diagnosis not present

## 2020-06-12 DIAGNOSIS — E1122 Type 2 diabetes mellitus with diabetic chronic kidney disease: Secondary | ICD-10-CM | POA: Diagnosis not present

## 2020-06-12 DIAGNOSIS — I10 Essential (primary) hypertension: Secondary | ICD-10-CM | POA: Diagnosis not present

## 2020-06-12 DIAGNOSIS — M79605 Pain in left leg: Secondary | ICD-10-CM | POA: Diagnosis not present

## 2020-06-12 DIAGNOSIS — Z299 Encounter for prophylactic measures, unspecified: Secondary | ICD-10-CM | POA: Diagnosis not present

## 2020-06-12 DIAGNOSIS — M79604 Pain in right leg: Secondary | ICD-10-CM | POA: Diagnosis not present

## 2020-06-24 DIAGNOSIS — Z23 Encounter for immunization: Secondary | ICD-10-CM | POA: Diagnosis not present

## 2020-06-26 DIAGNOSIS — H6123 Impacted cerumen, bilateral: Secondary | ICD-10-CM | POA: Diagnosis not present

## 2020-06-26 DIAGNOSIS — E1165 Type 2 diabetes mellitus with hyperglycemia: Secondary | ICD-10-CM | POA: Diagnosis not present

## 2020-06-26 DIAGNOSIS — E1122 Type 2 diabetes mellitus with diabetic chronic kidney disease: Secondary | ICD-10-CM | POA: Diagnosis not present

## 2020-06-26 DIAGNOSIS — I10 Essential (primary) hypertension: Secondary | ICD-10-CM | POA: Diagnosis not present

## 2020-06-26 DIAGNOSIS — Z299 Encounter for prophylactic measures, unspecified: Secondary | ICD-10-CM | POA: Diagnosis not present

## 2020-06-30 DIAGNOSIS — Z1331 Encounter for screening for depression: Secondary | ICD-10-CM | POA: Diagnosis not present

## 2020-06-30 DIAGNOSIS — I10 Essential (primary) hypertension: Secondary | ICD-10-CM | POA: Diagnosis not present

## 2020-06-30 DIAGNOSIS — Z1339 Encounter for screening examination for other mental health and behavioral disorders: Secondary | ICD-10-CM | POA: Diagnosis not present

## 2020-06-30 DIAGNOSIS — Z299 Encounter for prophylactic measures, unspecified: Secondary | ICD-10-CM | POA: Diagnosis not present

## 2020-06-30 DIAGNOSIS — Z0000 Encounter for general adult medical examination without abnormal findings: Secondary | ICD-10-CM | POA: Diagnosis not present

## 2020-06-30 DIAGNOSIS — Z7189 Other specified counseling: Secondary | ICD-10-CM | POA: Diagnosis not present

## 2020-06-30 DIAGNOSIS — Z6830 Body mass index (BMI) 30.0-30.9, adult: Secondary | ICD-10-CM | POA: Diagnosis not present

## 2020-07-07 DIAGNOSIS — E7800 Pure hypercholesterolemia, unspecified: Secondary | ICD-10-CM | POA: Diagnosis not present

## 2020-07-07 DIAGNOSIS — Z79899 Other long term (current) drug therapy: Secondary | ICD-10-CM | POA: Diagnosis not present

## 2020-07-07 DIAGNOSIS — R5383 Other fatigue: Secondary | ICD-10-CM | POA: Diagnosis not present

## 2020-07-17 DIAGNOSIS — E1165 Type 2 diabetes mellitus with hyperglycemia: Secondary | ICD-10-CM | POA: Diagnosis not present

## 2020-07-17 DIAGNOSIS — I4891 Unspecified atrial fibrillation: Secondary | ICD-10-CM | POA: Diagnosis not present

## 2020-07-17 DIAGNOSIS — I10 Essential (primary) hypertension: Secondary | ICD-10-CM | POA: Diagnosis not present

## 2020-07-17 DIAGNOSIS — Z299 Encounter for prophylactic measures, unspecified: Secondary | ICD-10-CM | POA: Diagnosis not present

## 2020-07-17 DIAGNOSIS — N186 End stage renal disease: Secondary | ICD-10-CM | POA: Diagnosis not present

## 2020-07-28 DIAGNOSIS — E1122 Type 2 diabetes mellitus with diabetic chronic kidney disease: Secondary | ICD-10-CM | POA: Diagnosis not present

## 2020-07-28 DIAGNOSIS — N186 End stage renal disease: Secondary | ICD-10-CM | POA: Diagnosis not present

## 2020-07-28 DIAGNOSIS — I509 Heart failure, unspecified: Secondary | ICD-10-CM | POA: Diagnosis not present

## 2020-07-28 DIAGNOSIS — E1165 Type 2 diabetes mellitus with hyperglycemia: Secondary | ICD-10-CM | POA: Diagnosis not present

## 2020-07-28 DIAGNOSIS — Z789 Other specified health status: Secondary | ICD-10-CM | POA: Diagnosis not present

## 2020-07-28 DIAGNOSIS — Z299 Encounter for prophylactic measures, unspecified: Secondary | ICD-10-CM | POA: Diagnosis not present

## 2020-07-28 DIAGNOSIS — I10 Essential (primary) hypertension: Secondary | ICD-10-CM | POA: Diagnosis not present

## 2020-08-01 DIAGNOSIS — L600 Ingrowing nail: Secondary | ICD-10-CM | POA: Diagnosis not present

## 2020-08-01 DIAGNOSIS — E109 Type 1 diabetes mellitus without complications: Secondary | ICD-10-CM | POA: Diagnosis not present

## 2020-08-06 DIAGNOSIS — L600 Ingrowing nail: Secondary | ICD-10-CM | POA: Diagnosis not present

## 2020-08-06 DIAGNOSIS — E1165 Type 2 diabetes mellitus with hyperglycemia: Secondary | ICD-10-CM | POA: Diagnosis not present

## 2020-08-06 DIAGNOSIS — I10 Essential (primary) hypertension: Secondary | ICD-10-CM | POA: Diagnosis not present

## 2020-08-06 DIAGNOSIS — Z299 Encounter for prophylactic measures, unspecified: Secondary | ICD-10-CM | POA: Diagnosis not present

## 2020-08-06 DIAGNOSIS — I509 Heart failure, unspecified: Secondary | ICD-10-CM | POA: Diagnosis not present

## 2020-08-11 DIAGNOSIS — I739 Peripheral vascular disease, unspecified: Secondary | ICD-10-CM | POA: Diagnosis not present

## 2020-09-04 DIAGNOSIS — N186 End stage renal disease: Secondary | ICD-10-CM | POA: Diagnosis not present

## 2020-09-04 DIAGNOSIS — Z299 Encounter for prophylactic measures, unspecified: Secondary | ICD-10-CM | POA: Diagnosis not present

## 2020-09-04 DIAGNOSIS — I10 Essential (primary) hypertension: Secondary | ICD-10-CM | POA: Diagnosis not present

## 2020-09-04 DIAGNOSIS — E1165 Type 2 diabetes mellitus with hyperglycemia: Secondary | ICD-10-CM | POA: Diagnosis not present

## 2020-09-04 DIAGNOSIS — J449 Chronic obstructive pulmonary disease, unspecified: Secondary | ICD-10-CM | POA: Diagnosis not present

## 2020-09-11 DIAGNOSIS — Z713 Dietary counseling and surveillance: Secondary | ICD-10-CM | POA: Diagnosis not present

## 2020-09-11 DIAGNOSIS — I509 Heart failure, unspecified: Secondary | ICD-10-CM | POA: Diagnosis not present

## 2020-09-11 DIAGNOSIS — I10 Essential (primary) hypertension: Secondary | ICD-10-CM | POA: Diagnosis not present

## 2020-09-11 DIAGNOSIS — Z299 Encounter for prophylactic measures, unspecified: Secondary | ICD-10-CM | POA: Diagnosis not present

## 2020-09-11 DIAGNOSIS — F419 Anxiety disorder, unspecified: Secondary | ICD-10-CM | POA: Diagnosis not present

## 2020-09-15 DIAGNOSIS — I739 Peripheral vascular disease, unspecified: Secondary | ICD-10-CM | POA: Diagnosis not present

## 2020-09-19 DIAGNOSIS — R109 Unspecified abdominal pain: Secondary | ICD-10-CM | POA: Diagnosis not present

## 2020-09-19 DIAGNOSIS — I509 Heart failure, unspecified: Secondary | ICD-10-CM | POA: Diagnosis not present

## 2020-09-19 DIAGNOSIS — K5900 Constipation, unspecified: Secondary | ICD-10-CM | POA: Diagnosis not present

## 2020-09-19 DIAGNOSIS — J449 Chronic obstructive pulmonary disease, unspecified: Secondary | ICD-10-CM | POA: Diagnosis not present

## 2020-09-19 DIAGNOSIS — I110 Hypertensive heart disease with heart failure: Secondary | ICD-10-CM | POA: Diagnosis not present

## 2020-09-20 DIAGNOSIS — Z743 Need for continuous supervision: Secondary | ICD-10-CM | POA: Diagnosis not present

## 2020-09-20 DIAGNOSIS — R5381 Other malaise: Secondary | ICD-10-CM | POA: Diagnosis not present

## 2020-09-20 DIAGNOSIS — R69 Illness, unspecified: Secondary | ICD-10-CM | POA: Diagnosis not present

## 2020-09-20 DIAGNOSIS — R279 Unspecified lack of coordination: Secondary | ICD-10-CM | POA: Diagnosis not present

## 2020-09-25 DIAGNOSIS — Z299 Encounter for prophylactic measures, unspecified: Secondary | ICD-10-CM | POA: Diagnosis not present

## 2020-09-25 DIAGNOSIS — K5900 Constipation, unspecified: Secondary | ICD-10-CM | POA: Diagnosis not present

## 2020-09-25 DIAGNOSIS — I509 Heart failure, unspecified: Secondary | ICD-10-CM | POA: Diagnosis not present

## 2020-09-25 DIAGNOSIS — I10 Essential (primary) hypertension: Secondary | ICD-10-CM | POA: Diagnosis not present

## 2020-09-25 DIAGNOSIS — Z0000 Encounter for general adult medical examination without abnormal findings: Secondary | ICD-10-CM | POA: Diagnosis not present

## 2020-10-29 DIAGNOSIS — E1122 Type 2 diabetes mellitus with diabetic chronic kidney disease: Secondary | ICD-10-CM | POA: Diagnosis not present

## 2020-10-29 DIAGNOSIS — N186 End stage renal disease: Secondary | ICD-10-CM | POA: Diagnosis not present

## 2020-10-29 DIAGNOSIS — I10 Essential (primary) hypertension: Secondary | ICD-10-CM | POA: Diagnosis not present

## 2020-10-29 DIAGNOSIS — Z299 Encounter for prophylactic measures, unspecified: Secondary | ICD-10-CM | POA: Diagnosis not present

## 2020-10-29 DIAGNOSIS — E1165 Type 2 diabetes mellitus with hyperglycemia: Secondary | ICD-10-CM | POA: Diagnosis not present

## 2020-10-29 DIAGNOSIS — Z6830 Body mass index (BMI) 30.0-30.9, adult: Secondary | ICD-10-CM | POA: Diagnosis not present

## 2020-10-29 DIAGNOSIS — I509 Heart failure, unspecified: Secondary | ICD-10-CM | POA: Diagnosis not present

## 2020-11-03 DIAGNOSIS — I739 Peripheral vascular disease, unspecified: Secondary | ICD-10-CM | POA: Diagnosis not present

## 2020-11-06 DIAGNOSIS — Z23 Encounter for immunization: Secondary | ICD-10-CM | POA: Diagnosis not present

## 2020-12-03 DIAGNOSIS — I452 Bifascicular block: Secondary | ICD-10-CM | POA: Diagnosis not present

## 2020-12-03 DIAGNOSIS — I110 Hypertensive heart disease with heart failure: Secondary | ICD-10-CM | POA: Diagnosis not present

## 2020-12-03 DIAGNOSIS — K219 Gastro-esophageal reflux disease without esophagitis: Secondary | ICD-10-CM | POA: Diagnosis not present

## 2020-12-03 DIAGNOSIS — R1013 Epigastric pain: Secondary | ICD-10-CM | POA: Diagnosis not present

## 2020-12-03 DIAGNOSIS — J449 Chronic obstructive pulmonary disease, unspecified: Secondary | ICD-10-CM | POA: Diagnosis not present

## 2020-12-03 DIAGNOSIS — I700 Atherosclerosis of aorta: Secondary | ICD-10-CM | POA: Diagnosis not present

## 2020-12-03 DIAGNOSIS — Z8719 Personal history of other diseases of the digestive system: Secondary | ICD-10-CM | POA: Diagnosis not present

## 2020-12-03 DIAGNOSIS — R0902 Hypoxemia: Secondary | ICD-10-CM | POA: Diagnosis not present

## 2020-12-03 DIAGNOSIS — R0689 Other abnormalities of breathing: Secondary | ICD-10-CM | POA: Diagnosis not present

## 2020-12-03 DIAGNOSIS — R52 Pain, unspecified: Secondary | ICD-10-CM | POA: Diagnosis not present

## 2020-12-03 DIAGNOSIS — R1084 Generalized abdominal pain: Secondary | ICD-10-CM | POA: Diagnosis not present

## 2020-12-04 DIAGNOSIS — Z743 Need for continuous supervision: Secondary | ICD-10-CM | POA: Diagnosis not present

## 2020-12-04 DIAGNOSIS — R531 Weakness: Secondary | ICD-10-CM | POA: Diagnosis not present

## 2020-12-11 DIAGNOSIS — Z23 Encounter for immunization: Secondary | ICD-10-CM | POA: Diagnosis not present

## 2020-12-15 DIAGNOSIS — I509 Heart failure, unspecified: Secondary | ICD-10-CM | POA: Diagnosis not present

## 2020-12-15 DIAGNOSIS — N3281 Overactive bladder: Secondary | ICD-10-CM | POA: Diagnosis not present

## 2020-12-29 DIAGNOSIS — Z713 Dietary counseling and surveillance: Secondary | ICD-10-CM | POA: Diagnosis not present

## 2020-12-29 DIAGNOSIS — F419 Anxiety disorder, unspecified: Secondary | ICD-10-CM | POA: Diagnosis not present

## 2020-12-29 DIAGNOSIS — Z299 Encounter for prophylactic measures, unspecified: Secondary | ICD-10-CM | POA: Diagnosis not present

## 2020-12-29 DIAGNOSIS — I10 Essential (primary) hypertension: Secondary | ICD-10-CM | POA: Diagnosis not present

## 2020-12-29 DIAGNOSIS — I509 Heart failure, unspecified: Secondary | ICD-10-CM | POA: Diagnosis not present

## 2021-01-12 DIAGNOSIS — I10 Essential (primary) hypertension: Secondary | ICD-10-CM | POA: Diagnosis not present

## 2021-01-12 DIAGNOSIS — Z299 Encounter for prophylactic measures, unspecified: Secondary | ICD-10-CM | POA: Diagnosis not present

## 2021-01-12 DIAGNOSIS — J028 Acute pharyngitis due to other specified organisms: Secondary | ICD-10-CM | POA: Diagnosis not present

## 2021-01-12 DIAGNOSIS — B9789 Other viral agents as the cause of diseases classified elsewhere: Secondary | ICD-10-CM | POA: Diagnosis not present

## 2021-01-15 DIAGNOSIS — I130 Hypertensive heart and chronic kidney disease with heart failure and stage 1 through stage 4 chronic kidney disease, or unspecified chronic kidney disease: Secondary | ICD-10-CM | POA: Diagnosis present

## 2021-01-15 DIAGNOSIS — R0902 Hypoxemia: Secondary | ICD-10-CM | POA: Diagnosis not present

## 2021-01-15 DIAGNOSIS — I82413 Acute embolism and thrombosis of femoral vein, bilateral: Secondary | ICD-10-CM | POA: Diagnosis not present

## 2021-01-15 DIAGNOSIS — D649 Anemia, unspecified: Secondary | ICD-10-CM | POA: Diagnosis not present

## 2021-01-15 DIAGNOSIS — Z20822 Contact with and (suspected) exposure to covid-19: Secondary | ICD-10-CM | POA: Diagnosis present

## 2021-01-15 DIAGNOSIS — I5033 Acute on chronic diastolic (congestive) heart failure: Secondary | ICD-10-CM | POA: Diagnosis present

## 2021-01-15 DIAGNOSIS — I82432 Acute embolism and thrombosis of left popliteal vein: Secondary | ICD-10-CM | POA: Diagnosis present

## 2021-01-15 DIAGNOSIS — K219 Gastro-esophageal reflux disease without esophagitis: Secondary | ICD-10-CM | POA: Diagnosis present

## 2021-01-15 DIAGNOSIS — E873 Alkalosis: Secondary | ICD-10-CM | POA: Diagnosis present

## 2021-01-15 DIAGNOSIS — E871 Hypo-osmolality and hyponatremia: Secondary | ICD-10-CM | POA: Diagnosis present

## 2021-01-15 DIAGNOSIS — N183 Chronic kidney disease, stage 3 unspecified: Secondary | ICD-10-CM | POA: Diagnosis present

## 2021-01-15 DIAGNOSIS — R110 Nausea: Secondary | ICD-10-CM | POA: Diagnosis not present

## 2021-01-15 DIAGNOSIS — Z515 Encounter for palliative care: Secondary | ICD-10-CM | POA: Diagnosis not present

## 2021-01-15 DIAGNOSIS — I509 Heart failure, unspecified: Secondary | ICD-10-CM | POA: Diagnosis not present

## 2021-01-15 DIAGNOSIS — Z794 Long term (current) use of insulin: Secondary | ICD-10-CM | POA: Diagnosis not present

## 2021-01-15 DIAGNOSIS — E860 Dehydration: Secondary | ICD-10-CM | POA: Diagnosis present

## 2021-01-15 DIAGNOSIS — Z79890 Hormone replacement therapy (postmenopausal): Secondary | ICD-10-CM | POA: Diagnosis not present

## 2021-01-15 DIAGNOSIS — R000 Tachycardia, unspecified: Secondary | ICD-10-CM | POA: Diagnosis not present

## 2021-01-15 DIAGNOSIS — D500 Iron deficiency anemia secondary to blood loss (chronic): Secondary | ICD-10-CM | POA: Diagnosis present

## 2021-01-15 DIAGNOSIS — I5032 Chronic diastolic (congestive) heart failure: Secondary | ICD-10-CM | POA: Diagnosis not present

## 2021-01-15 DIAGNOSIS — Z7901 Long term (current) use of anticoagulants: Secondary | ICD-10-CM | POA: Diagnosis not present

## 2021-01-15 DIAGNOSIS — Z9981 Dependence on supplemental oxygen: Secondary | ICD-10-CM | POA: Diagnosis not present

## 2021-01-15 DIAGNOSIS — J449 Chronic obstructive pulmonary disease, unspecified: Secondary | ICD-10-CM | POA: Diagnosis present

## 2021-01-15 DIAGNOSIS — I82562 Chronic embolism and thrombosis of left calf muscular vein: Secondary | ICD-10-CM | POA: Diagnosis present

## 2021-01-15 DIAGNOSIS — N179 Acute kidney failure, unspecified: Secondary | ICD-10-CM | POA: Diagnosis not present

## 2021-01-15 DIAGNOSIS — I82512 Chronic embolism and thrombosis of left femoral vein: Secondary | ICD-10-CM | POA: Diagnosis present

## 2021-01-15 DIAGNOSIS — I82433 Acute embolism and thrombosis of popliteal vein, bilateral: Secondary | ICD-10-CM | POA: Diagnosis not present

## 2021-01-15 DIAGNOSIS — R0602 Shortness of breath: Secondary | ICD-10-CM | POA: Diagnosis not present

## 2021-01-15 DIAGNOSIS — R059 Cough, unspecified: Secondary | ICD-10-CM | POA: Diagnosis not present

## 2021-01-15 DIAGNOSIS — R1310 Dysphagia, unspecified: Secondary | ICD-10-CM | POA: Diagnosis present

## 2021-01-15 DIAGNOSIS — Z66 Do not resuscitate: Secondary | ICD-10-CM | POA: Diagnosis present

## 2021-01-15 DIAGNOSIS — J811 Chronic pulmonary edema: Secondary | ICD-10-CM | POA: Diagnosis not present

## 2021-01-15 DIAGNOSIS — R052 Subacute cough: Secondary | ICD-10-CM | POA: Diagnosis not present

## 2021-01-15 DIAGNOSIS — I82561 Chronic embolism and thrombosis of right calf muscular vein: Secondary | ICD-10-CM | POA: Diagnosis not present

## 2021-01-15 DIAGNOSIS — R609 Edema, unspecified: Secondary | ICD-10-CM | POA: Diagnosis not present

## 2021-01-15 DIAGNOSIS — I517 Cardiomegaly: Secondary | ICD-10-CM | POA: Diagnosis not present

## 2021-01-15 DIAGNOSIS — I959 Hypotension, unspecified: Secondary | ICD-10-CM | POA: Diagnosis not present

## 2021-01-15 DIAGNOSIS — E039 Hypothyroidism, unspecified: Secondary | ICD-10-CM | POA: Diagnosis present

## 2021-01-15 DIAGNOSIS — J9621 Acute and chronic respiratory failure with hypoxia: Secondary | ICD-10-CM | POA: Diagnosis not present

## 2021-01-19 DIAGNOSIS — D649 Anemia, unspecified: Secondary | ICD-10-CM | POA: Diagnosis not present

## 2021-01-19 DIAGNOSIS — Z66 Do not resuscitate: Secondary | ICD-10-CM | POA: Diagnosis not present

## 2021-01-19 DIAGNOSIS — R0602 Shortness of breath: Secondary | ICD-10-CM | POA: Diagnosis not present

## 2021-01-19 DIAGNOSIS — I509 Heart failure, unspecified: Secondary | ICD-10-CM | POA: Diagnosis not present

## 2021-01-20 DIAGNOSIS — R0602 Shortness of breath: Secondary | ICD-10-CM | POA: Diagnosis not present

## 2021-01-20 DIAGNOSIS — D649 Anemia, unspecified: Secondary | ICD-10-CM | POA: Diagnosis not present

## 2021-01-20 DIAGNOSIS — I509 Heart failure, unspecified: Secondary | ICD-10-CM | POA: Diagnosis not present

## 2021-01-20 DIAGNOSIS — Z66 Do not resuscitate: Secondary | ICD-10-CM | POA: Diagnosis not present

## 2021-01-21 DIAGNOSIS — I509 Heart failure, unspecified: Secondary | ICD-10-CM | POA: Diagnosis not present

## 2021-01-21 DIAGNOSIS — Z66 Do not resuscitate: Secondary | ICD-10-CM | POA: Diagnosis not present

## 2021-01-21 DIAGNOSIS — R0602 Shortness of breath: Secondary | ICD-10-CM | POA: Diagnosis not present

## 2021-01-21 DIAGNOSIS — D649 Anemia, unspecified: Secondary | ICD-10-CM | POA: Diagnosis not present

## 2021-01-22 DEATH — deceased
